# Patient Record
Sex: Male | Born: 1935 | Race: White | Hispanic: No | Smoking: Former smoker
Health system: Southern US, Community
[De-identification: ages and names within clinical notes are randomized; demographics above are authoritative.]

## PROBLEM LIST (undated history)

## (undated) DIAGNOSIS — N183 Chronic kidney disease, stage 3 unspecified: Secondary | ICD-10-CM

## (undated) DIAGNOSIS — N529 Male erectile dysfunction, unspecified: Secondary | ICD-10-CM

## (undated) DIAGNOSIS — R001 Bradycardia, unspecified: Secondary | ICD-10-CM

## (undated) DIAGNOSIS — E119 Type 2 diabetes mellitus without complications: Secondary | ICD-10-CM

## (undated) DIAGNOSIS — D649 Anemia, unspecified: Secondary | ICD-10-CM

## (undated) DIAGNOSIS — I350 Nonrheumatic aortic (valve) stenosis: Secondary | ICD-10-CM

## (undated) DIAGNOSIS — C679 Malignant neoplasm of bladder, unspecified: Secondary | ICD-10-CM

## (undated) DIAGNOSIS — I272 Pulmonary hypertension, unspecified: Secondary | ICD-10-CM

## (undated) DIAGNOSIS — M1990 Unspecified osteoarthritis, unspecified site: Secondary | ICD-10-CM

## (undated) DIAGNOSIS — I10 Essential (primary) hypertension: Secondary | ICD-10-CM

## (undated) DIAGNOSIS — Z9289 Personal history of other medical treatment: Secondary | ICD-10-CM

## (undated) DIAGNOSIS — K5730 Diverticulosis of large intestine without perforation or abscess without bleeding: Secondary | ICD-10-CM

## (undated) DIAGNOSIS — N400 Enlarged prostate without lower urinary tract symptoms: Secondary | ICD-10-CM

## (undated) DIAGNOSIS — R319 Hematuria, unspecified: Secondary | ICD-10-CM

## (undated) DIAGNOSIS — Z85828 Personal history of other malignant neoplasm of skin: Secondary | ICD-10-CM

## (undated) DIAGNOSIS — Z85528 Personal history of other malignant neoplasm of kidney: Secondary | ICD-10-CM

## (undated) DIAGNOSIS — I4819 Other persistent atrial fibrillation: Secondary | ICD-10-CM

## (undated) DIAGNOSIS — I5189 Other ill-defined heart diseases: Secondary | ICD-10-CM

## (undated) DIAGNOSIS — C61 Malignant neoplasm of prostate: Secondary | ICD-10-CM

## (undated) HISTORY — DX: Type 2 diabetes mellitus without complications: E11.9

## (undated) HISTORY — DX: Diverticulosis of large intestine without perforation or abscess without bleeding: K57.30

## (undated) HISTORY — DX: Pulmonary hypertension, unspecified: I27.20

## (undated) HISTORY — DX: Nonrheumatic aortic (valve) stenosis: I35.0

## (undated) HISTORY — DX: Chronic kidney disease, stage 3 (moderate): N18.3

## (undated) HISTORY — DX: Other ill-defined heart diseases: I51.89

## (undated) HISTORY — DX: Other persistent atrial fibrillation: I48.19

## (undated) HISTORY — DX: Male erectile dysfunction, unspecified: N52.9

## (undated) HISTORY — DX: Chronic kidney disease, stage 3 unspecified: N18.30

## (undated) HISTORY — PX: OTHER SURGICAL HISTORY: SHX169

## (undated) HISTORY — DX: Malignant neoplasm of bladder, unspecified: C67.9

## (undated) HISTORY — PX: TONSILLECTOMY: SUR1361

## (undated) HISTORY — DX: Bradycardia, unspecified: R00.1

## (undated) HISTORY — DX: Benign prostatic hyperplasia without lower urinary tract symptoms: N40.0

## (undated) HISTORY — DX: Malignant neoplasm of prostate: C61

---

## 1997-05-22 ENCOUNTER — Ambulatory Visit (HOSPITAL_COMMUNITY): Admission: RE | Admit: 1997-05-22 | Discharge: 1997-05-22 | Payer: Self-pay | Admitting: Gastroenterology

## 1998-03-17 HISTORY — PX: PROSTATE SURGERY: SHX751

## 1998-03-17 HISTORY — PX: URINARY SURGERY: SHX2626

## 1998-04-03 ENCOUNTER — Emergency Department (HOSPITAL_COMMUNITY): Admission: EM | Admit: 1998-04-03 | Discharge: 1998-04-03 | Payer: Self-pay | Admitting: Emergency Medicine

## 1998-04-04 ENCOUNTER — Inpatient Hospital Stay (HOSPITAL_COMMUNITY): Admission: EM | Admit: 1998-04-04 | Discharge: 1998-04-06 | Payer: Self-pay | Admitting: Emergency Medicine

## 1998-04-05 ENCOUNTER — Encounter: Payer: Self-pay | Admitting: Urology

## 1998-04-17 HISTORY — PX: OTHER SURGICAL HISTORY: SHX169

## 1998-05-04 ENCOUNTER — Ambulatory Visit (HOSPITAL_BASED_OUTPATIENT_CLINIC_OR_DEPARTMENT_OTHER): Admission: RE | Admit: 1998-05-04 | Discharge: 1998-05-04 | Payer: Self-pay | Admitting: Urology

## 1999-02-20 ENCOUNTER — Emergency Department (HOSPITAL_COMMUNITY): Admission: EM | Admit: 1999-02-20 | Discharge: 1999-02-20 | Payer: Self-pay | Admitting: Emergency Medicine

## 2002-02-16 HISTORY — PX: MOHS SURGERY: SUR867

## 2002-04-24 ENCOUNTER — Ambulatory Visit (HOSPITAL_COMMUNITY): Admission: RE | Admit: 2002-04-24 | Discharge: 2002-04-24 | Payer: Self-pay | Admitting: Gastroenterology

## 2002-05-17 HISTORY — PX: HERNIA REPAIR: SHX51

## 2002-05-28 ENCOUNTER — Encounter: Payer: Self-pay | Admitting: *Deleted

## 2002-05-28 ENCOUNTER — Encounter (INDEPENDENT_AMBULATORY_CARE_PROVIDER_SITE_OTHER): Payer: Self-pay | Admitting: Specialist

## 2002-05-28 ENCOUNTER — Encounter: Admission: RE | Admit: 2002-05-28 | Discharge: 2002-05-28 | Payer: Self-pay | Admitting: *Deleted

## 2002-05-28 ENCOUNTER — Ambulatory Visit (HOSPITAL_BASED_OUTPATIENT_CLINIC_OR_DEPARTMENT_OTHER): Admission: RE | Admit: 2002-05-28 | Discharge: 2002-05-28 | Payer: Self-pay | Admitting: *Deleted

## 2003-12-17 HISTORY — PX: PROSTATE SURGERY: SHX751

## 2004-01-02 ENCOUNTER — Emergency Department (HOSPITAL_COMMUNITY): Admission: EM | Admit: 2004-01-02 | Discharge: 2004-01-02 | Payer: Self-pay | Admitting: Emergency Medicine

## 2004-02-17 HISTORY — PX: EYE SURGERY: SHX253

## 2004-08-02 ENCOUNTER — Ambulatory Visit (HOSPITAL_COMMUNITY): Admission: RE | Admit: 2004-08-02 | Discharge: 2004-08-02 | Payer: Self-pay | Admitting: Orthopaedic Surgery

## 2005-05-17 ENCOUNTER — Encounter: Payer: Self-pay | Admitting: Urology

## 2005-12-20 ENCOUNTER — Ambulatory Visit: Admission: RE | Admit: 2005-12-20 | Discharge: 2006-03-20 | Payer: Self-pay | Admitting: Radiation Oncology

## 2006-03-27 ENCOUNTER — Ambulatory Visit: Admission: RE | Admit: 2006-03-27 | Discharge: 2006-06-25 | Payer: Self-pay | Admitting: Radiation Oncology

## 2006-04-17 HISTORY — PX: OTHER SURGICAL HISTORY: SHX169

## 2006-06-17 DIAGNOSIS — I4819 Other persistent atrial fibrillation: Secondary | ICD-10-CM

## 2006-06-17 HISTORY — DX: Other persistent atrial fibrillation: I48.19

## 2006-07-10 ENCOUNTER — Inpatient Hospital Stay (HOSPITAL_COMMUNITY): Admission: EM | Admit: 2006-07-10 | Discharge: 2006-07-12 | Payer: Self-pay | Admitting: Emergency Medicine

## 2006-07-11 ENCOUNTER — Encounter (INDEPENDENT_AMBULATORY_CARE_PROVIDER_SITE_OTHER): Payer: Self-pay | Admitting: Internal Medicine

## 2007-02-21 ENCOUNTER — Ambulatory Visit (HOSPITAL_COMMUNITY): Admission: RE | Admit: 2007-02-21 | Discharge: 2007-02-22 | Payer: Self-pay | Admitting: Urology

## 2007-02-21 ENCOUNTER — Encounter (INDEPENDENT_AMBULATORY_CARE_PROVIDER_SITE_OTHER): Payer: Self-pay | Admitting: Urology

## 2007-02-28 ENCOUNTER — Ambulatory Visit: Payer: Self-pay | Admitting: Hematology & Oncology

## 2007-03-02 ENCOUNTER — Emergency Department (HOSPITAL_COMMUNITY): Admission: EM | Admit: 2007-03-02 | Discharge: 2007-03-02 | Payer: Self-pay | Admitting: Emergency Medicine

## 2007-03-04 ENCOUNTER — Observation Stay (HOSPITAL_COMMUNITY): Admission: EM | Admit: 2007-03-04 | Discharge: 2007-03-06 | Payer: Self-pay | Admitting: Urology

## 2007-03-05 ENCOUNTER — Encounter (INDEPENDENT_AMBULATORY_CARE_PROVIDER_SITE_OTHER): Payer: Self-pay | Admitting: Urology

## 2007-03-14 ENCOUNTER — Ambulatory Visit (HOSPITAL_COMMUNITY): Admission: RE | Admit: 2007-03-14 | Discharge: 2007-03-14 | Payer: Self-pay | Admitting: Urology

## 2007-03-17 HISTORY — PX: NEPHRECTOMY: SHX65

## 2007-04-08 ENCOUNTER — Inpatient Hospital Stay (HOSPITAL_COMMUNITY): Admission: RE | Admit: 2007-04-08 | Discharge: 2007-04-12 | Payer: Self-pay | Admitting: Urology

## 2007-04-08 ENCOUNTER — Encounter (INDEPENDENT_AMBULATORY_CARE_PROVIDER_SITE_OTHER): Payer: Self-pay | Admitting: Urology

## 2007-06-17 ENCOUNTER — Encounter (INDEPENDENT_AMBULATORY_CARE_PROVIDER_SITE_OTHER): Payer: Self-pay | Admitting: Urology

## 2007-06-17 ENCOUNTER — Ambulatory Visit (HOSPITAL_COMMUNITY): Admission: RE | Admit: 2007-06-17 | Discharge: 2007-06-18 | Payer: Self-pay | Admitting: Urology

## 2007-07-18 ENCOUNTER — Ambulatory Visit (HOSPITAL_BASED_OUTPATIENT_CLINIC_OR_DEPARTMENT_OTHER): Admission: RE | Admit: 2007-07-18 | Discharge: 2007-07-18 | Payer: Self-pay | Admitting: Urology

## 2007-07-18 ENCOUNTER — Encounter (INDEPENDENT_AMBULATORY_CARE_PROVIDER_SITE_OTHER): Payer: Self-pay | Admitting: Urology

## 2007-10-03 ENCOUNTER — Ambulatory Visit (HOSPITAL_BASED_OUTPATIENT_CLINIC_OR_DEPARTMENT_OTHER): Admission: RE | Admit: 2007-10-03 | Discharge: 2007-10-03 | Payer: Self-pay | Admitting: Urology

## 2007-10-03 ENCOUNTER — Encounter (INDEPENDENT_AMBULATORY_CARE_PROVIDER_SITE_OTHER): Payer: Self-pay | Admitting: Urology

## 2007-10-23 ENCOUNTER — Emergency Department (HOSPITAL_COMMUNITY): Admission: EM | Admit: 2007-10-23 | Discharge: 2007-10-24 | Payer: Self-pay

## 2007-11-09 ENCOUNTER — Emergency Department (HOSPITAL_COMMUNITY): Admission: EM | Admit: 2007-11-09 | Discharge: 2007-11-09 | Payer: Self-pay | Admitting: Emergency Medicine

## 2007-11-28 ENCOUNTER — Ambulatory Visit (HOSPITAL_COMMUNITY): Admission: RE | Admit: 2007-11-28 | Discharge: 2007-11-29 | Payer: Self-pay | Admitting: Urology

## 2007-11-28 ENCOUNTER — Encounter (INDEPENDENT_AMBULATORY_CARE_PROVIDER_SITE_OTHER): Payer: Self-pay | Admitting: Urology

## 2007-12-01 ENCOUNTER — Emergency Department (HOSPITAL_COMMUNITY): Admission: EM | Admit: 2007-12-01 | Discharge: 2007-12-01 | Payer: Self-pay | Admitting: Emergency Medicine

## 2007-12-31 ENCOUNTER — Ambulatory Visit (HOSPITAL_COMMUNITY): Admission: RE | Admit: 2007-12-31 | Discharge: 2008-01-01 | Payer: Self-pay | Admitting: Urology

## 2008-01-10 ENCOUNTER — Inpatient Hospital Stay (HOSPITAL_COMMUNITY): Admission: EM | Admit: 2008-01-10 | Discharge: 2008-01-12 | Payer: Self-pay | Admitting: Podiatry

## 2008-01-22 ENCOUNTER — Inpatient Hospital Stay (HOSPITAL_COMMUNITY): Admission: AD | Admit: 2008-01-22 | Discharge: 2008-02-05 | Payer: Self-pay | Admitting: Urology

## 2008-01-24 ENCOUNTER — Encounter (INDEPENDENT_AMBULATORY_CARE_PROVIDER_SITE_OTHER): Payer: Self-pay | Admitting: Urology

## 2008-02-13 ENCOUNTER — Encounter (HOSPITAL_BASED_OUTPATIENT_CLINIC_OR_DEPARTMENT_OTHER): Admission: RE | Admit: 2008-02-13 | Discharge: 2008-04-04 | Payer: Self-pay | Admitting: Internal Medicine

## 2008-02-25 ENCOUNTER — Ambulatory Visit (HOSPITAL_COMMUNITY): Admission: RE | Admit: 2008-02-25 | Discharge: 2008-02-25 | Payer: Self-pay | Admitting: Internal Medicine

## 2008-04-06 ENCOUNTER — Encounter (HOSPITAL_BASED_OUTPATIENT_CLINIC_OR_DEPARTMENT_OTHER): Admission: RE | Admit: 2008-04-06 | Discharge: 2008-06-19 | Payer: Self-pay | Admitting: General Surgery

## 2008-07-27 ENCOUNTER — Other Ambulatory Visit: Payer: Self-pay | Admitting: Urology

## 2008-07-30 ENCOUNTER — Encounter (INDEPENDENT_AMBULATORY_CARE_PROVIDER_SITE_OTHER): Payer: Self-pay | Admitting: Urology

## 2008-07-30 ENCOUNTER — Ambulatory Visit (HOSPITAL_COMMUNITY): Admission: AD | Admit: 2008-07-30 | Discharge: 2008-07-31 | Payer: Self-pay | Admitting: Urology

## 2008-11-30 ENCOUNTER — Inpatient Hospital Stay (HOSPITAL_COMMUNITY): Admission: AD | Admit: 2008-11-30 | Discharge: 2008-12-04 | Payer: Self-pay | Admitting: Urology

## 2009-03-12 ENCOUNTER — Emergency Department (HOSPITAL_COMMUNITY): Admission: EM | Admit: 2009-03-12 | Discharge: 2009-03-12 | Payer: Self-pay | Admitting: Emergency Medicine

## 2009-06-16 ENCOUNTER — Emergency Department (HOSPITAL_COMMUNITY): Admission: EM | Admit: 2009-06-16 | Discharge: 2009-06-16 | Payer: Self-pay | Admitting: Emergency Medicine

## 2009-06-18 ENCOUNTER — Emergency Department (HOSPITAL_COMMUNITY): Admission: EM | Admit: 2009-06-18 | Discharge: 2009-06-18 | Payer: Self-pay | Admitting: Emergency Medicine

## 2009-07-05 ENCOUNTER — Emergency Department (HOSPITAL_COMMUNITY): Admission: EM | Admit: 2009-07-05 | Discharge: 2009-07-05 | Payer: Self-pay | Admitting: Emergency Medicine

## 2009-07-12 ENCOUNTER — Ambulatory Visit (HOSPITAL_COMMUNITY): Admission: RE | Admit: 2009-07-12 | Discharge: 2009-07-12 | Payer: Self-pay | Admitting: Urology

## 2009-09-28 ENCOUNTER — Emergency Department (HOSPITAL_COMMUNITY): Admission: EM | Admit: 2009-09-28 | Discharge: 2009-09-29 | Payer: Self-pay | Admitting: Emergency Medicine

## 2009-09-29 ENCOUNTER — Observation Stay (HOSPITAL_COMMUNITY): Admission: AD | Admit: 2009-09-29 | Discharge: 2009-09-30 | Payer: Self-pay | Admitting: Urology

## 2010-03-31 LAB — CBC
HCT: 34.5 % — ABNORMAL LOW (ref 39.0–52.0)
Hemoglobin: 11.2 g/dL — ABNORMAL LOW (ref 13.0–17.0)
MCHC: 32.3 g/dL (ref 30.0–36.0)
WBC: 12.4 10*3/uL — ABNORMAL HIGH (ref 4.0–10.5)

## 2010-03-31 LAB — BASIC METABOLIC PANEL
GFR calc non Af Amer: 35 mL/min — ABNORMAL LOW (ref >60)
Glucose, Bld: 89 mg/dL (ref 70–99)
Potassium: 4.6 mEq/L (ref 3.5–5.1)
Sodium: 139 mEq/L (ref 135–145)

## 2010-04-04 LAB — URINALYSIS, ROUTINE W REFLEX MICROSCOPIC
Glucose, UA: NEGATIVE mg/dL
Ketones, ur: 15 mg/dL — AB
Ketones, ur: NEGATIVE mg/dL
Nitrite: POSITIVE — AB
Protein, ur: 100 mg/dL — AB
Specific Gravity, Urine: 1.015 (ref 1.005–1.030)
Urobilinogen, UA: 0.2 mg/dL (ref 0.0–1.0)
Urobilinogen, UA: 1 mg/dL (ref 0.0–1.0)
pH: 7 (ref 5.0–8.0)

## 2010-04-04 LAB — URINE MICROSCOPIC-ADD ON

## 2010-04-04 LAB — URINE CULTURE
Colony Count: 50000
Culture: NO GROWTH

## 2010-04-08 LAB — URINALYSIS, ROUTINE W REFLEX MICROSCOPIC
Bilirubin Urine: NEGATIVE
Glucose, UA: NEGATIVE mg/dL
Nitrite: POSITIVE — AB
Protein, ur: >300 mg/dL — AB
Specific Gravity, Urine: 1.021 (ref 1.005–1.030)
Urobilinogen, UA: 0.2 mg/dL (ref 0.0–1.0)
pH: 7 (ref 5.0–8.0)

## 2010-04-08 LAB — URINE MICROSCOPIC-ADD ON

## 2010-04-20 LAB — TYPE AND SCREEN
ABO/RH(D): O POS
Antibody Screen: NEGATIVE

## 2010-04-20 LAB — BASIC METABOLIC PANEL
BUN: 17 mg/dL (ref 6–23)
CO2: 25 mEq/L (ref 19–32)
Calcium: 8 mg/dL — ABNORMAL LOW (ref 8.4–10.5)
Chloride: 107 mEq/L (ref 96–112)
Creatinine, Ser: 1.74 mg/dL — ABNORMAL HIGH (ref 0.4–1.5)
GFR calc Af Amer: 45 mL/min — ABNORMAL LOW (ref >60)
Glucose, Bld: 135 mg/dL — ABNORMAL HIGH (ref 70–99)

## 2010-04-20 LAB — COMPREHENSIVE METABOLIC PANEL
ALT: 13 U/L (ref 0–53)
AST: 16 U/L (ref 0–37)
Albumin: 3 g/dL — ABNORMAL LOW (ref 3.5–5.2)
Calcium: 8.4 mg/dL (ref 8.4–10.5)
Chloride: 109 mEq/L (ref 96–112)
Creatinine, Ser: 1.99 mg/dL — ABNORMAL HIGH (ref 0.4–1.5)
GFR calc Af Amer: 39 mL/min — ABNORMAL LOW (ref >60)
Sodium: 138 mEq/L (ref 135–145)
Total Bilirubin: 0.7 mg/dL (ref 0.3–1.2)

## 2010-04-20 LAB — CBC
MCV: 92.9 fL (ref 78.0–100.0)
Platelets: 162 10*3/uL (ref 150–400)
RBC: 3.35 MIL/uL — ABNORMAL LOW (ref 4.22–5.81)
WBC: 8.4 10*3/uL (ref 4.0–10.5)

## 2010-04-20 LAB — HEMOGLOBIN AND HEMATOCRIT, BLOOD
HCT: 26.6 % — ABNORMAL LOW (ref 39.0–52.0)
Hemoglobin: 6.3 g/dL — CL (ref 13.0–17.0)
Hemoglobin: 8.8 g/dL — ABNORMAL LOW (ref 13.0–17.0)
Hemoglobin: 8.9 g/dL — ABNORMAL LOW (ref 13.0–17.0)

## 2010-04-20 LAB — PREPARE RBC (CROSSMATCH)

## 2010-04-25 LAB — BASIC METABOLIC PANEL
BUN: 13 mg/dL (ref 6–23)
Chloride: 104 mEq/L (ref 96–112)
Creatinine, Ser: 1.73 mg/dL — ABNORMAL HIGH (ref 0.4–1.5)
Glucose, Bld: 107 mg/dL — ABNORMAL HIGH (ref 70–99)
Potassium: 4.5 mEq/L (ref 3.5–5.1)

## 2010-04-25 LAB — CBC
HCT: 39.4 % (ref 39.0–52.0)
MCV: 93.5 fL (ref 78.0–100.0)
Platelets: 196 10*3/uL (ref 150–400)
RDW: 16.4 % — ABNORMAL HIGH (ref 11.5–15.5)

## 2010-05-02 LAB — CBC
HCT: 24.5 % — ABNORMAL LOW (ref 39.0–52.0)
HCT: 25.1 % — ABNORMAL LOW (ref 39.0–52.0)
HCT: 26.4 % — ABNORMAL LOW (ref 39.0–52.0)
HCT: 27.9 % — ABNORMAL LOW (ref 39.0–52.0)
Hemoglobin: 8.3 g/dL — ABNORMAL LOW (ref 13.0–17.0)
Hemoglobin: 8.8 g/dL — ABNORMAL LOW (ref 13.0–17.0)
Hemoglobin: 9.3 g/dL — ABNORMAL LOW (ref 13.0–17.0)
MCHC: 32.9 g/dL (ref 30.0–36.0)
MCHC: 33.2 g/dL (ref 30.0–36.0)
MCHC: 33.3 g/dL (ref 30.0–36.0)
MCV: 87.4 fL (ref 78.0–100.0)
MCV: 87.8 fL (ref 78.0–100.0)
Platelets: 233 10*3/uL (ref 150–400)
RBC: 2.87 MIL/uL — ABNORMAL LOW (ref 4.22–5.81)
RBC: 3.02 MIL/uL — ABNORMAL LOW (ref 4.22–5.81)
RBC: 3.18 MIL/uL — ABNORMAL LOW (ref 4.22–5.81)
RDW: 14.8 % (ref 11.5–15.5)
RDW: 15.1 % (ref 11.5–15.5)
RDW: 15.3 % (ref 11.5–15.5)
WBC: 6.5 10*3/uL (ref 4.0–10.5)
WBC: 6.7 10*3/uL (ref 4.0–10.5)
WBC: 9.9 10*3/uL (ref 4.0–10.5)

## 2010-05-02 LAB — DIFFERENTIAL
Basophils Absolute: 0.1 10*3/uL (ref 0.0–0.1)
Basophils Absolute: 0.1 10*3/uL (ref 0.0–0.1)
Basophils Absolute: 0.1 10*3/uL (ref 0.0–0.1)
Basophils Relative: 1 % (ref 0–1)
Basophils Relative: 1 % (ref 0–1)
Basophils Relative: 1 % (ref 0–1)
Eosinophils Absolute: 0.3 10*3/uL (ref 0.0–0.7)
Eosinophils Relative: 3 % (ref 0–5)
Eosinophils Relative: 5 % (ref 0–5)
Lymphocytes Relative: 9 % — ABNORMAL LOW (ref 12–46)
Monocytes Absolute: 1.3 10*3/uL — ABNORMAL HIGH (ref 0.1–1.0)
Monocytes Absolute: 1.6 10*3/uL — ABNORMAL HIGH (ref 0.1–1.0)
Monocytes Relative: 11 % (ref 3–12)
Monocytes Relative: 15 % — ABNORMAL HIGH (ref 3–12)
Neutro Abs: 4.4 10*3/uL (ref 1.7–7.7)
Neutrophils Relative %: 67 % (ref 43–77)

## 2010-05-02 LAB — CROSSMATCH

## 2010-05-02 LAB — BASIC METABOLIC PANEL
BUN: 19 mg/dL (ref 6–23)
BUN: 8 mg/dL (ref 6–23)
CO2: 24 mEq/L (ref 19–32)
CO2: 25 mEq/L (ref 19–32)
Calcium: 7.7 mg/dL — ABNORMAL LOW (ref 8.4–10.5)
Calcium: 7.9 mg/dL — ABNORMAL LOW (ref 8.4–10.5)
Calcium: 8.2 mg/dL — ABNORMAL LOW (ref 8.4–10.5)
Calcium: 8.2 mg/dL — ABNORMAL LOW (ref 8.4–10.5)
Chloride: 102 mEq/L (ref 96–112)
Chloride: 108 mEq/L (ref 96–112)
Chloride: 108 mEq/L (ref 96–112)
Creatinine, Ser: 1.48 mg/dL (ref 0.4–1.5)
Creatinine, Ser: 1.6 mg/dL — ABNORMAL HIGH (ref 0.4–1.5)
GFR calc Af Amer: 45 mL/min — ABNORMAL LOW (ref >60)
GFR calc Af Amer: 49 mL/min — ABNORMAL LOW (ref >60)
GFR calc Af Amer: 55 mL/min — ABNORMAL LOW (ref >60)
GFR calc non Af Amer: 40 mL/min — ABNORMAL LOW (ref >60)
GFR calc non Af Amer: 45 mL/min — ABNORMAL LOW (ref >60)
Glucose, Bld: 107 mg/dL — ABNORMAL HIGH (ref 70–99)
Glucose, Bld: 119 mg/dL — ABNORMAL HIGH (ref 70–99)
Potassium: 3.8 mEq/L (ref 3.5–5.1)
Sodium: 135 mEq/L (ref 135–145)
Sodium: 137 mEq/L (ref 135–145)
Sodium: 137 mEq/L (ref 135–145)
Sodium: 139 mEq/L (ref 135–145)

## 2010-05-02 LAB — COMPREHENSIVE METABOLIC PANEL
Alkaline Phosphatase: 44 U/L (ref 39–117)
BUN: 12 mg/dL (ref 6–23)
Chloride: 110 mEq/L (ref 96–112)
GFR calc non Af Amer: 39 mL/min — ABNORMAL LOW (ref >60)
Glucose, Bld: 114 mg/dL — ABNORMAL HIGH (ref 70–99)
Potassium: 4.1 mEq/L (ref 3.5–5.1)
Total Bilirubin: 0.7 mg/dL (ref 0.3–1.2)

## 2010-05-02 LAB — URINALYSIS, ROUTINE W REFLEX MICROSCOPIC
Glucose, UA: NEGATIVE mg/dL
Nitrite: NEGATIVE
Specific Gravity, Urine: 1.016 (ref 1.005–1.030)
pH: 6 (ref 5.0–8.0)

## 2010-05-02 LAB — CULTURE, BLOOD (ROUTINE X 2)

## 2010-05-02 LAB — URINE CULTURE

## 2010-05-02 LAB — HEMOGLOBIN AND HEMATOCRIT, BLOOD
HCT: 22 % — ABNORMAL LOW (ref 39.0–52.0)
HCT: 22.7 % — ABNORMAL LOW (ref 39.0–52.0)
HCT: 28.6 % — ABNORMAL LOW (ref 39.0–52.0)
HCT: 28.9 % — ABNORMAL LOW (ref 39.0–52.0)
Hemoglobin: 7.3 g/dL — CL (ref 13.0–17.0)
Hemoglobin: 8.8 g/dL — ABNORMAL LOW (ref 13.0–17.0)
Hemoglobin: 9.4 g/dL — ABNORMAL LOW (ref 13.0–17.0)

## 2010-05-02 LAB — PROTIME-INR
INR: 0.9 (ref 0.00–1.49)
INR: 1 (ref 0.00–1.49)
Prothrombin Time: 12.7 seconds (ref 11.6–15.2)
Prothrombin Time: 13.4 seconds (ref 11.6–15.2)

## 2010-05-02 LAB — APTT
aPTT: 27 seconds (ref 24–37)
aPTT: 31 seconds (ref 24–37)

## 2010-05-02 LAB — URINE MICROSCOPIC-ADD ON

## 2010-05-02 LAB — PREPARE RBC (CROSSMATCH)

## 2010-05-02 LAB — TYPE AND SCREEN: ABO/RH(D): O POS

## 2010-05-31 NOTE — Op Note (Signed)
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   PROCEDURE:  1. Cystoscopy.  2. Examination under anesthesia.  3. Transurethral resection of bladder tumor (3 cm).   SURGEON:  Heloise Purpura, MD   ANESTHESIA:  General.   COMPLICATIONS:  None.   INDICATION:  Mr. [name] is an 77 year old gentleman with a history of  urothelial carcinoma of the left renal pelvis status post a  nephroureterectomy.  He subsequently was found to have a recurrence in  his bladder and underwent transurethral resection in June 2009.  He  presented recently for surveillance cystoscopy and was noted to have two  small recurrent papillary tumors that did appear low-grade on  cystoscopic exam.  After discussing these findings, he elected to  proceed with the above procedures.  Potential risks, complications and  alternative options were discussed in detail.  Informed consent was  obtained.   DESCRIPTION OF PROCEDURE:  The patient was taken to the operating room  and a general anesthetic was administered.  He was given preoperative  antibiotics, placed in the dorsal lithotomy position and prepped and  draped in the usual sterile fashion.  Next, a preoperative time-out was  performed.  Pelvic examination under anesthesia revealed no pelvic  masses. Cystourethroscopy was then performed and the bladder was closely  examined.  The patient's healing tissue from his prior TURP was noted.  He had moderate  trabeculation throughout the bladder with a few small  diverticula.  There was noted to be a papillary tumor off the left  lateral aspect of the bladder with an additional small bladder tumor  just above that.  In addition, there was a small papillary tumor toward  the posterior aspect of the bladder.  The largest of these bladder  tumors measured approximately 3 cm.  The 24 French resectoscope sheath  was then advanced into the bladder and with loop resection, the bladder  tumors were removed and sent for pathologic analysis.  Additional cold  cup biopsy sections of the base of the main tumor were also taken.  These areas were then fulgurated and the bladder was emptied and  reinspected.  Hemostasis appeared excellent.  The  resectoscope was then removed and a 20 [place] Foley catheter was  inserted into the bladder and 40 mL of mitomycin in 20 mL of sterile  water was instilled into the patient's bladder and the catheter was  plugged.  The patient was able to be awakened and transferred to the  recovery unit in satisfactory condition.      Heloise Purpura, MD  Electronically Signed     LB/MEDQ  D:  10/03/2007  T:  10/06/2007  Job:  [id number]

## 2010-05-31 NOTE — Discharge Summary (Signed)
NAMESELSO, Alexander Barnett NO.:  [id number]   MEDICAL RECORD NO.:  [id number]          PATIENT TYPE:  OBV   LOCATION:  1615                         FACILITY:  [facility]   PHYSICIAN:  Heloise Purpura, MD      DATE OF BIRTH:  1920/08/07   DATE OF ADMISSION:  03/04/2007  DATE OF DISCHARGE:  03/06/2007                               DISCHARGE SUMMARY   ADMISSION DIAGNOSES:  1. Hematuria.  2. Left renal pelvic tumor.   DISCHARGE DIAGNOSES:  1. Hematuria.  2. Left renal pelvic tumor.   HISTORY AND PHYSICAL:  For full details, please see admission history  and physical.  Briefly, Mr. Clementson is an 77 year old gentleman who  recently presented with gross hematuria and was found to have a left  renal pelvic tumor.  He underwent diagnostic ureteroscopy and following  this procedure, had persistent worsening gross hematuria, including  intermittent clot urinary retention.  This was initially managed with  catheter drainage and irrigation, but continued to persist.  He was  therefore admitted to the hospital and placed on continuous bladder  irrigation.  He was monitored on the evening of March 04, 2007, and  the following morning, continued to have persistent hematuria and clots  within his bladder.  He was therefore taken to the operating room for  cystoscopy, clot evacuation and identification of the potential bleeding  source.  This was noted to be in the area of the bladder neck and  prostate and likely a result of his benign prostatic hyperplasia and  previous radiation therapy for prostate cancer.  He did undergo a small  transurethral resection of the prostate to help aid in hemostasis.  He  was maintained on continuous bladder irrigation that day and his urine  remained clear.  The following day, he underwent a voiding trial which  he passed successfully.  He was able to be discharged home in excellent  condition.   DISPOSITION:  Home.   DISCHARGE MEDICATIONS:  He was  instructed to resume his regular home  medications except to hold his Voltaren and his Coumadin.   DISCHARGE INSTRUCTIONS:  He was instructed to call the office should he  develop recurrent hematuria or difficulty voiding.   FOLLOW UP:  He will plan to follow-up as already scheduled in early  March for further evaluation and preparation for possible left  nephroureterectomy.      Heloise Purpura, MD  Electronically Signed    LB/MEDQ  D:  03/07/2007  T:  03/08/2007  Job:  [id number]

## 2010-05-31 NOTE — Op Note (Signed)
NAMETASHAWN, LASWELL NO.:  [id number]   MEDICAL RECORD NO.:  [id number]          PATIENT TYPE:  INP   LOCATION:  0002                         FACILITY:  [facility]   PHYSICIAN:  Heloise Purpura, MD      DATE OF BIRTH:  [birth year]   DATE OF PROCEDURE:  04/08/2007  DATE OF DISCHARGE:                               OPERATIVE REPORT   PREOPERATIVE DIAGNOSIS:  Left renal pelvic mass.   POSTOPERATIVE DIAGNOSIS:  Left renal pelvic mass.   PROCEDURE:  Laparoscopic nephrectomy with open distal ureterectomy and  excision of bladder cuff.   SURGEON:  Dr. Heloise Purpura.   ASSISTANT:  Dr. Delman Kitten.   ANESTHESIA:  General.   COMPLICATIONS:  None.   ESTIMATED BLOOD LOSS:  200 mL.   INTRAVENOUS FLUIDS:  2500 mL of lactated Ringer's.   SPECIMENS:  Left kidney, ureter, and bladder cuff.   DISPOSITION OF PATHOLOGY SPECIMEN:  To the pathology lab.   DRAINS:  1. 28-French three-way Foley catheter.  2. #19 Blake pelvic drain.   INDICATIONS:  Mr. Lindfors is an 77 year old gentleman who presented with  gross hematuria.  He does have a history of prostate cancer and has  undergone prior treatment with external beam radiation therapy and  androgen deprivation.  He underwent a complete hematuria evaluation  which demonstrated a large left renal pelvic papillary tumor consistent  with a urothelial tumor.  He was also noted to have a suspicious urine  cytology from the left renal pelvis.  He underwent metastatic evaluation  which was negative and after a thorough discussion, the patient elects  to proceed with the above procedures.  The potential risks,  complications, and alternative treatment options were discussed in  detail and informed consent was obtained.   DESCRIPTION OF PROCEDURE:  The patient was taken to the operating room  and general anesthetic was administered.  He was given preoperative  antibiotics, placed in the left modified flank position, and prepped and  draped in the usual sterile fashion.  Next preoperative time-out was  performed.  A site was selected just off the midline toward the left  side approximately at the level of the umbilicus for placement of the  camera port.  This was placed using a standard open Hassan technique  which allowed entry into the peritoneal cavity under direct vision  without difficulty.  Zero Vicryl holding stitches were then placed in  the anterior rectus sheath and a 10-mm Hassan cannula was inserted.  The  30 degrees lens was then used to inspect the abdomen after a  pneumoperitoneum was established.  There was no evidence for any intra-  abdominal injuries or other abnormalities.  The remaining ports were  then placed.  A 12 mm port was placed in the left lower quadrant and a 5  mm port was placed in the left upper quadrant.  An additional 5 mm port  was placed in the far left lateral abdominal wall.  The white line of  Toldt was incised along the length of the descending colon with a  harmonic scalpel allowing the colon to  be reflected medially and the  space between the colonic mesentery the anterior layer of Gerota's  fascia to be developed.  The ureter and gonadal vein were identified at  the retroperitoneum was exposed and these structures were then lifted  anteriorly off the psoas muscle.  Dissection then proceeded superiorly  up toward the renal hilum where the main renal vein and renal artery  were identified.  Using a combination of ultrasonic energy and blunt  dissection, the single renal artery and renal vein were identified and  were isolated.  The renal artery was then divided between multiple Hem-o-  lok clips.  The renal vein was then examined and did appear to be flat.  It was then divided between multiple extra large Hem-o-lok clips.  Dissection then proceeded superiorly.  Gerota's fascia was intentionally  entered, allowing the adrenal gland to be spared.  The superior pole of  the renal  parenchyma was identified and the harmonic scalpel was used to  separate the upper pole attachments from the spleen and adrenal gland.  The dissection then proceeded laterally where the kidney was separated  from the abdominal wall, allowing it to be freely mobilized on the  ureter and gonadal vein.  The gonadal vein was then dissected down  toward the inguinal ring where it was divided between multiple Hem-o-lok  clips.  The ureter was then isolated over the iliac vessels down toward  the pelvis.  At this point the patient was placed in the Trendelenburg  position and an additional 12 mm port was placed in the lower midline.  The bladder was filled with approximately 200 mL of saline allowing the  contour of the bladder to be identified.  The medial umbilical ligament  was identified and was divided allowing the bladder to be reflected  posteriorly and lateral in the space of Retzius to be entered and  developed.  The ureter was identified down toward its insertion into the  detrusor muscle with the small periureteral vessels identified and  clipped as necessary.  At this point the original camera port site and  left lower quadrant 12-mm port were closed with 0 Vicryl sutures placed  in the aid of the suture passer device.  All remaining ports were then  removed under direct vision.  The 12 mm lower midline port site was then  extended inferiorly into a lower midline incision and carried down along  the length of this incision through the rectus fascia.  This again  allowed the space of Retzius to be further developed the bladder  reflected medially.  The kidney was brought out through this wound and  the ureter was identified and the remaining attachments divided as  necessary.  A pursestring 3-0 Vicryl suture was then placed around the  insertion of the ureter and the ureter was freed up with the  electrocautery and the bladder was emptied with a wide bladder cuff.  Pursestring 3-0  Vicryl sutures were then used to perform closure of the  bladder with a subsequent 2-0 running Vicryl suture to reinforce this  closure.  The bladder was then reinflated and there did not appear to be  evidence for a leak from the bladder.  A #19 Blake drain was placed in  the perivesical space and brought out through a separate stab incision  in the left lower quadrant.  Preparations were then made for closure  after hemostasis was ensured.  The rectus fascia was closed with a  running #1 PDS  suture and the lower midline incision was copiously  irrigated.  All incision sites were then reapproximated at the skin  level with staples.  The patient appeared to tolerate the procedure well  and without complications.  Sterile dressings were applied and the  procedure was ended.      Heloise Purpura, MD  Electronically Signed     LB/MEDQ  D:  04/08/2007  T:  04/08/2007  Job:  [id number]

## 2010-05-31 NOTE — Op Note (Signed)
NAME:  GURLEY, CLIMER NO.:  [id number]   MEDICAL RECORD NO.:  [id number]          PATIENT TYPE:  AMB   LOCATION:  DAY                          FACILITY:  [facility]   PHYSICIAN:  Heloise Purpura, MD      DATE OF BIRTH:  1920/04/15   DATE OF PROCEDURE:  02/21/2007  DATE OF DISCHARGE:                               OPERATIVE REPORT   PREOPERATIVE DIAGNOSIS:  1. Hematuria.  2. Left renal pelvic mass.   POSTOPERATIVE DIAGNOSIS:  1. Hematuria.  2. Left renal pelvic tumor.   PROCEDURE:  1. Cystoscopy.  2. Bladder biopsy.  3. Left renal pelvis washing.  4. Left ureteroscopy (diagnostic).  5. Left ureteral stent placement (6 x 26).   SURGEON:  Heloise Purpura, M.D.   ANESTHESIA:  General.   COMPLICATIONS:  None.   PATHOLOGY:  1. Left bladder biopsy.  2. Left renal pelvis washing.   Disposition of specimens to pathology.   FINDINGS:  On retrograde pyelography, the patient was noted to have a  large filling defect of the upper pole calix.  He was also noted to have  J-hooking of his distal ureter.  On direct inspection with ureteroscopy,  he was noted to have a papillary tumor of the upper pole of the kidney.   INDICATIONS FOR PROCEDURE:  The patient is an 77 year old gentleman with  a history of prostate cancer status post radiation therapy.  He recently  developed gross hematuria and underwent a full evaluation which did  demonstrate findings on the CT scan suggestive of an upper pole renal  pelvic mass.  He also was found on office cystoscopy to have  erythematous area that were potentially concerning.  He also had a  suspicious cytology from his bladder washing.  After discussion  regarding these findings and options for management, he elected to  proceed with the above procedures.  Potential risks, complications, and  alternative options were discussed in detail with the patient and  informed consent was obtained.   DESCRIPTION OF PROCEDURE:  The patient  was taken to the operating room  and a general anesthesia was administered.  He was given preoperative  antibiotics, placed in the dorsal lithotomy position, and prepped and  draped in the usual sterile fashion.   A preoperative timeout was performed.  Cystourethroscopy was performed  which demonstrated a significantly enlarged prostate with trilobar  hypertrophy.  There were noted to be changes consistent with radiation  therapy including blanching of the mucosa.  On inspection of the  bladder, he was noted to have neovascularity along the trigone and  bladder neck.  There was noted to be erythematous areas along the left  bladder wall which were previously seen on office cystoscopy.  Otherwise, his bladder was free of any obvious tumors, stones, etc.  He  was noted to have moderate trabeculation of the bladder with multiple  small diverticula.  The left bladder wall was identified and the cold  cut biopsy forceps were used to biopsy this area.  This was then  fulgurated with the Bugby electrode to achieve hemostasis.  Attention  was  then turned to the left ureteral orifice which was cannulated with a  6 French ureteral catheter.  An attempt to place a 0.038 Sensor guide  wire was initially unsuccessful due to J-hooking of the ureter.  Therefore this wire was removed and a 0.038 glide wire was able to be  passed up the ureter and into the renal pelvis under fluoroscopic  guidance.  The 6 French ureteral catheter was advanced over the wire and  a renal pelvic washing was obtained.  Contrast was injected and a  retrograde pyelogram was performed.  No obvious ureteral filling defects  had been identified.  There was noted to be a filling defect in the  upper pole of the calix consistent with the patient's mass on CT scan.  A 0.038 Sensor guide wire was then advanced into the renal pelvis and  the ureteral catheter was removed.  A second 0.038 glide wire was then  advanced up into the renal  pelvis to be used as a working wire.  The  Storz flexible ureteroscope was advanced over the wire into the renal  pelvis and diagnostic ureteroscopy was performed.  The lower middle  calices demonstrated no abnormalities or tumors.  The upper pole did  demonstrate a large papillary tumor consistent with a urothelial  carcinoma.  Due to the fact that the patient did have a clear obvious  tumor and a suspicious cytology on previous evaluation as well as the  fact that he is on chronic anticoagulation, it was decided not to biopsy  this lesion at this time as his diagnosis was fairly clear.  The  ureteroscope was then retracted down the ureter and no other  abnormalities were noted along the length of the left ureter.  The  cystoscope was then placed over the guide wire that remained in place  and a 6 x 26 double J ureteral stent was placed under fluoroscopic and  cystoscope guidance.  On removal of the cystoscope, the stent was  malpositioned.  A wire was then readvanced up the stent and  appropriately positioned and the cystoscope was then able to be removed  successfully and the bladder drained.  The patient appeared to tolerate  the procedure well without complications.  He was able to be awakened  and transferred to the recovery room in satisfactory condition.      Heloise Purpura, MD  Electronically Signed     LB/MEDQ  D:  02/21/2007  T:  02/22/2007  Job:  [id number]

## 2010-05-31 NOTE — Consult Note (Signed)
NAMEANSLEY, STANWOOD NO.:  [id number]   MEDICAL RECORD NO.:  [id number]          PATIENT TYPE:  INP   LOCATION:  1615                         FACILITY:  [facility]   PHYSICIAN:  Heloise Purpura, MD      DATE OF BIRTH:  01-Jun-1920   DATE OF CONSULTATION:  03/02/2007  DATE OF DISCHARGE:                                 CONSULTATION   REASON FOR CONSULTATION:  1. Hematuria.  2. Clot retention.   REQUESTING PHYSICIAN:  Dr. Carleene Cooper   HISTORY:  Mr. Cabreja is an 77 year old gentleman who is well-known to me  and has a history of prostate cancer, benign prostatic hyperplasia, and  a left renal pelvic tumor.  He has had gross hematuria since his  ureteroscopic procedure approximately one week ago.  He was seen in the  office on Friday and underwent placement of an Ainsworth catheter and  irrigation.  He was discharged home and presents today with further  difficulties including  intermittent obstruction of his catheter and  continued hematuria.  He denies any fever, abdominal pain, nausea or  vomiting.   PHYSICAL EXAMINATION:  ABDOMEN:  Soft and nontender.  GU:  The patient's urethral catheter is draining grossly bloody urine.   PROCEDURE:  A 0.038 glide wire was inserted through the Ainsworth  catheter into the bladder and the Ainsworth catheter was removed.  A 24-  French Silastic hematuria catheter was able to be advanced over the wire  and into the bladder and subsequently irrigated.  Multiple clots were  removed from the bladder with this catheter after attempts at irrigation  with Ainsworth catheter were unsuccessful.  This was placed to a leg bag  and the urine appeared to be draining well.  Mr. Vanderlinde was subsequently  able to be discharged home after a period of observation.   IMPRESSION:  Hematuria and clot urinary retention.   PLAN:  Mr. Cowden will follow up on Monday as scheduled for further  evaluation.      Heloise Purpura, MD  Electronically  Signed     LB/MEDQ  D:  03/02/2007  T:  03/04/2007  Job:  [id number]   cc:   Osvaldo Human, M.D.  Fax: [phone]

## 2010-05-31 NOTE — Op Note (Signed)
NAMEWILLIM, TURNAGE NO.:  [id number]   MEDICAL RECORD NO.:  [id number]          PATIENT TYPE:  OIB   LOCATION:  1426                         FACILITY:  [facility]   PHYSICIAN:  Heloise Purpura, MD      DATE OF BIRTH:  1920-07-26   DATE OF PROCEDURE:  11/28/2007  DATE OF DISCHARGE:                               OPERATIVE REPORT   PREOPERATIVE DIAGNOSES:  1. Gross hematuria  2. History of bladder cancer.  3. History of prostate cancer.  4. Benign prostatic hypertrophy and urinary retention.   POSTOPERATIVE DIAGNOSES:  1. Gross hematuria  2. History of bladder cancer.  3. History of prostate cancer.  4. Benign prostatic hypertrophy and urinary retention.   PROCEDURES:  1. Cystoscopy.  2. Transurethral resection of the prostate.   SURGEON:  Dr. Heloise Purpura.   ASSISTANT:  Dr. Aldean Baker.   ANESTHESIA:  General.   COMPLICATIONS:  None.   SPECIMENS:  Prostate chips.   DISPOSITION OF SPECIMEN:  To pathology.   DRAINS:  22-French three-way Foley catheter.   INDICATIONS:  Mr. Shavers is an 77 year old gentleman with a history of a  urothelial carcinoma of the left renal pelvis and bladder.  He also has  a history of prostate cancer status post radiation therapy.  He did  develop urinary retention last spring and subsequently underwent a  transurethral resection of the prostate this summer and was able to void  postoperatively.  Recently, he has developed recurrence of gross  hematuria and difficulty voiding as well as intermittent urinary  retention.  We therefore discussed options and he did agree to proceed  with cystoscopic evaluation and possible transurethral resection of the  prostate or bladder tumors as present.  He has been treated with culture  specific antibiotics for 1 week.  The potential risks, complications,  and alternative options were discussed in detail with the patient and  informed consent was obtained.   DESCRIPTION OF PROCEDURE:   The patient was taken to the operating room  and a general anesthetic was administered.  He was given preoperative  antibiotics, placed in the dorsal lithotomy position, prepped and draped  in the usual sterile fashion.  Next, a preoperative time-out was  performed.  Cystourethroscopy was then performed which demonstrated a  normal anterior urethra.  The prostatic urethra was noted to have a  markedly abnormal appearance with a white friable tissue that was  sloughing from the prostatic urethra consistent with his history of  radiation therapy and prior TURP.  He was noted to have some clot within  the prostatic urethra, indicating the prostate was the likely source of  hematuria.  The bladder was then systematically examined with the right  ureteral orifice noted to be in its normal anatomic position.  The left  ureteral orifice was absent consistent with his history of a left  nephroureterectomy.  The bladder was noted to be severely trabeculated  with multiple cellules and small diverticula.  On inspection of the  bladder, there was no evidence for any obvious bladder tumors.  There  was noted to be some  mild edema consistent with his recent Foley  catheter.  Attention then turned to the prostate.  It was decided to  proceed with transurethral resection of the prostate to help clear the  tissue that was sloughed as well as to evacuate any clots within the  prostatic urethra.  It was felt that this tissue was likely responsible  for his voiding symptoms and most likely his hematuria as well.  Therefore the cystoscope was removed and replaced with the 26-French  resectoscope sheath.  Using loop electrocautery resection, the friable  prostate tissue was then removed circumferentially between the bladder  neck and verumontanum.  Special care was taken not to proceed distally  where the external sphincter could be potentially injured, considering  his history of radiation therapy.  The  prostate chips were then removed  from the bladder and on reinspection, hemostasis appeared adequate.  Therefore, the resectoscope was removed and a 22-French three-way  catheter placed.  The patient tolerated this procedure well without  complications.  He was able to be transferred to recovery room in  satisfactory condition.      Heloise Purpura, MD  Electronically Signed     LB/MEDQ  D:  11/28/2007  T:  11/29/2007  Job:  [id number]

## 2010-05-31 NOTE — Discharge Summary (Signed)
NAMELESLEE, HAUETER NO.:  [id number]   MEDICAL RECORD NO.:  [id number]          PATIENT TYPE:  INP   LOCATION:  1424                         FACILITY:  [facility]   PHYSICIAN:  Heloise Purpura, MD      DATE OF BIRTH:  [birth year]   DATE OF ADMISSION:  04/08/2007  DATE OF DISCHARGE:  04/12/2007                               DISCHARGE SUMMARY   ADMISSION DIAGNOSES:  1. Left renal mass.  2. History of atrial fibrillation.   DISCHARGE DIAGNOSES:  1. 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  2. History of atrial fibrillation.   HISTORY AND PHYSICAL:  For full details, please see admission history  physical.  Briefly, [name] is an 77 year old gentleman who was found  to have gross hematuria.  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  After undergoing extensive evaluation including a negative metastatic  evaluation, we discussed options and elected to proceed with a left  nephroureterectomy.   HOSPITAL COURSE:  On April 08, 2007, he was taken to the operating room  and underwent a laparoscopic left nephroureterectomy with open distal  ureterectomy.  He tolerated this procedure well and without  complications.  Postoperatively, he was able to be transferred to a  regular hospital room and was monitored with telemetry.  He remained  hemodynamically stable and his hematocrit on postoperative day #1 was  found to be 30.7.  In addition, he was found to have a slight rise in  his creatinine at 1.58.  This was rechecked and stabilized and was 1.48  on April 11, 2007.  His diet was gradually advanced over the course of  the next couple of days as his bowel function returned.  He was also  able to begin ambulating, although he did have some difficulty  ambulating.  A physical therapy and occupational therapy consultation  were obtained and the patient was felt to best be served by discharge to  a  skilled nursing facility following recovery from his acute  hospitalization.  On postoperative day #3, he continued to have minimal  output from his pelvic drain and a creatinine level was checked, which  was consistent with serum.  This drain was therefore removed.  He was  tolerating a regular diet and on postoperative day #4, he underwent a  cystogram which demonstrated no evidence for a bladder leak.  He  therefore had his catheter removed and underwent a voiding trial.  He  was felt to be stable for discharge to a skilled nursing facility at  this point and his pain was well-controlled with oral pain medication.   DISPOSITION:  [facility] skilled nursing facility.   DISCHARGE MEDICATIONS:  He was instructed to resume his regular home  medications excepting any aspirin, nonsteroidal anti-inflammatory drugs,  or herbal supplements.  He will continue to hold his Coumadin until his  postoperative visit, when he may be able to resume this medication.   DISCHARGE INSTRUCTIONS:  He is instructed to be ambulatory and to  continue to work with physical  therapy and occupational therapy for  rehabilitation.  He has been instructed to refrain from any strenuous  activity or driving until he follows up.   FOLLOW-UP:  [name] is scheduled to follow up with me in  approximately 10 days for removal of his staples and further  postoperative evaluation.      Heloise Purpura, MD  Electronically Signed     LB/MEDQ  D:  04/12/2007  T:  04/12/2007  Job:  [id number]

## 2010-05-31 NOTE — Discharge Summary (Signed)
[name], Alexander Barnett NO.:  [id number]   MEDICAL RECORD NO.:  [id number]          PATIENT TYPE:  INP   LOCATION:  1445                         FACILITY:  [facility]   PHYSICIAN:  Heloise Purpura, MD      DATE OF BIRTH:  [birth year]   DATE OF ADMISSION:  01/22/2008  DATE OF DISCHARGE:  02/05/2008                               DISCHARGE SUMMARY   ADMISSION DIAGNOSES:  1. Hematuria.  2. History of urothelial carcinoma.  3. History of prostate cancer status post radiation therapy.   DISCHARGE DIAGNOSIS:  1. Hematuria.  2. History of urothelial carcinoma.  3. History of prostate cancer status post radiation therapy.  4. Bacteremia.   HISTORY AND PHYSICAL:  For full details, please see admission history  and physical.  Briefly, Mr. Goeken is an 77 year old gentleman with a  history of prostate cancer status post radiation therapy.  He also has a  history of urothelial carcinoma and is status post a left  nephroureterectomy.  He also has a history of BPH and is status post a  TURP.  He has had intermittent gross hematuria and clot urinary  retention.  This was felt to be related to a nonhealing prostate and he  has undergone multiple episodes of conservative therapy with catheter  placement and irrigation.  His urine will subsequently clear and then  will spontaneously again developed hematuria.   HOSPITAL COURSE:  On January 22, 2008, he was admitted to the hospital  with hematuria and clot retention.  A Foley catheter was placed and this  was subsequently irrigated and he was placed on continuous bladder  irrigation.  He was begun on Amicar and his hematocrit was followed.  He  did require blood transfusion on January 23, 2008 when his hemoglobin  dropped from 10.2 to 8.8.  Due to the fact that his hematuria persisted  and despite conservative therapy with Amicar, he did require further  transfusion as his hemoglobin dropped to 7.3, it was decided to take him  to the  operating room for further evaluation of his entire urothelium  and possible fulguration of any bleeding site.  This procedure on  January 24, 2008 demonstrated no obvious tumors within the bladder or  right upper collecting system.  He did appear to have a nonhealing  friable prostatic urethra as previously seen without any clear bleeding  sites.  He also had two areas in his right ureter that appeared to be  possibly strictured.  These were brushed and sent for cytology and there  was no evidence for malignancy.  A ureteral stent was left in place to  exclude obstruction.  His urine at that time was clear.  He was again  placed on continuous bladder irrigation and on followup later on that  afternoon again was noted to have fairly red urine despite irrigation.  We, therefore, decided to try intravesical installation of carboprost,  which was performed.  He was monitored over the next few days and  continued to have a drop in his hemoglobin back down to 7.6 again,  requiring another 2 units blood  transfusion.  On January 26, 2008, he  was noted to have a catheter that was not draining well and had multiple  clots removed with hand irrigation and was placed back on continuous  bladder irrigation.  The carboprost was continued as well as Amicar.  However, based on the fact that on January 28, 2008, he did not appear  to be making significant progress, it was decided to consider  percutaneous nephrostomy diversion.  His hemoglobin at this point  remained stable and he was not requiring further blood transfusions.  On  January 29, 2008, his hematocrit was noted to have decreased to 22.2.  He, therefore, was administered an additional 2 units of blood.  He went  down to interventional radiology that day and attempts to place a  nephrostomy tube were unsuccessful due to his non-dilated collecting  system.  He was, therefore, monitored with plans to return to the  interventional radiology suite  later that week, once the contrast  extravasation had disappeared, for retrograde ureteral catheter  insertion so that a nephrostomy tube could potentially be placed under  fluoroscopy.  On January 30, 2008, his hemoglobin was noted to have  increased appropriately, after his blood transfusion the previous day.  He was, therefore, monitored and on January 31, 2008 again remained  stable and was taken to the interventional radiology suite.  Flexible  cystoscopy was performed and his indwelling ureteral stent was exchanged  for a 6-French open-ended ureteral catheter.  With contrast injected  through this ureteral catheter, Dr. Lowella Dandy was able to place a nephrostomy  tube.  The patient was also noted to develop low grade fever earlier  that morning up to 101 degrees Fahrenheit.  He was maintained on  ciprofloxacin empirically at that time and blood and urine cultures were  sent, which subsequently demonstrated Staphylococcus.  He was maintained  on antibiotics and remained afebrile.  His white blood count was not  elevated and his hemoglobin remained stable over the next few days.  On  February 03, 2008, it was decided to a brought in his antibiotic coverage  with vancomycin as his urine culture returned sensitive to vancomycin  but resistant to fluoroquinolones.  He remained relatively stable and  his nephrostomy tube was draining well, although intermittently would  become somewhat red.  It was decided to take him back down to the  operating room on February 04, 2008 to clear any remaining clot out of  his bladder and this was performed uneventfully.  He had no continued  bleeding and on February 05, 2008, his hematocrit remained stable and his  nephrostomy tube was draining well.  His Foley catheter was removed at  this time and he was given a voiding trial, which he passed.  He was,  therefore, felt to be safe for discharge home.  Home health care was  arranged to help with nephrostomy  care.   DISPOSITION:  Home.   DISCHARGE MEDICATIONS:  1. He will resume his regular home medications excepting any aspirin,      nonsteroidal anti-inflammatory drugs, or herbal supplements.  2. In addition, he will begin a prescription for doxycycline, which      also was an appropriate antibiotic based on his blood culture      sensitivities.  3. Finally, he also will take Vicodin as needed for pain and was      provided a prescription for this medication.   DISCHARGE INSTRUCTIONS:  He was instructed on routine care for  his  nephrostomy tube and felt comfortable with this upon discharge.  He was  also set up for home health nursing to help assist with care and  dressing changes.   FOLLOW UP:  He will be scheduled to see me in approximately 2 weeks for  further evaluation.  My plan at that time would be to clamp his  nephrostomy tube to give him a voiding trial.  We also will consider  hyperbaric oxygen therapy which may potentially help with the healing of  his prostate and reduce future instances of bleeding.      Heloise Purpura, MD  Electronically Signed     LB/MEDQ  D:  02/05/2008  T:  02/05/2008  Job:  [id number]

## 2010-05-31 NOTE — Op Note (Signed)
NAMEDESMOND, SZABO NO.:  [id number]   MEDICAL RECORD NO.:  [id number]          PATIENT TYPE:  AMB   LOCATION:  DAY                          FACILITY:  [facility]   PHYSICIAN:  Heloise Purpura, MD      DATE OF BIRTH:  August 20, 1920   DATE OF PROCEDURE:  06/17/2007  DATE OF DISCHARGE:                               OPERATIVE REPORT   PREOPERATIVE DIAGNOSES:  1. History of urothelial carcinoma.  2. Urinary retention.  3. History of prostate cancer status post radiation therapy.   POSTOPERATIVE DIAGNOSES:  1. History of urothelial carcinoma.  2. Bladder tumor.  3. Urinary retention.  4. History of prostate cancer status post radiation therapy.   PROCEDURES:  1. Cystoscopy.  2. Saline bladder washing for cytology.  3. Right retrograde pyelography.  4. Right ureteroscopy.  5. Right ureteral stent placement (0N02).  6. Transurethral resection of small bladder tumor (less than 2 cm)      with fulguration.  7. Postoperative installation of mitomycin-C (40 mg in 20 mL of      sterile water).   SURGEON:  Dr. Heloise Purpura.   ANESTHESIA:  General.   COMPLICATIONS:  None.   INTRAOPERATIVE FINDINGS:  Right retrograde pyelography demonstrated J  hooking of the right ureter with 2 narrowed stenotic and tortuous areas  in the region of the distal and mid ureter.  There were no obvious  filling defects within the ureter or within the right renal pelvis.   INDICATIONS:  Mr. Marietta is an 77 year old gentleman with a history of  prostate cancer status post radiation therapy.  He recently presented  with hematuria and was found to have a left renal pelvic tumor and  underwent a laparoscopic left nephroureterectomy which demonstrated a  low grade but and locally advanced urothelial carcinoma of the left  renal pelvis.  He also has a history of benign prostatic hyperplasia and  has subsequently developed urinary retention.  After undergoing a  urodynamic evaluation which did  demonstrate adequate detrusor function,  he elected to undergo a bladder outlet obstruction procedure.  In  addition, he was due for surveillance of his urothelial cancer.  After a  discussion regarding the potential risks, complications, and alternative  options, the patient elected to proceed with surveillance cystoscopy,  right retrograde pyelography, and transurethral vaporization of his  prostate to relieve his urinary retention.  Potential risks and  complications were discussed.  Informed consent was obtained.   DESCRIPTION OF PROCEDURE:  The patient was taken to the operating room  and a general anesthetic was administered.  He was administered broad-  spectrum IV antibiotics, placed in the dorsal lithotomy position, and  prepped and draped in the usual sterile fashion.  A preoperative time-  out was performed.  Cystourethroscopy was then performed which  demonstrated a normal anterior urethra.  He was noted to have a large  prostate with bilobar hypertrophy as well as a large median lobe.  Inspection of the bladder revealed some mild to moderate trabeculation  with areas consistent with edema from his indwelling Foley catheter.  There was also  noted to be two small papillary tumors on the left  posterior aspect of the bladder.  A saline bladder washing was obtained.  Attention then turned to the right ureteral orifice and a cone-tip  catheter was used to intubate the orifice.  Retrograde pyelogram was  performed with findings as dictated above.  Due to the fact that the  patient did have 2 separate stenotic and tortuous areas, it was decided  perform ureteroscopy for diagnostic purposes and to exclude a recurrent  a urothelial tumor.  A 0.038 Sensor guidewire was advanced up the ureter  and into the right renal pelvis.  Semi-rigid ureteroscopy was then  performed.  The tortuous and narrowed areas were somewhat difficult to  traverse but were able to be navigated with the aid of  an extra  Glidewire.  The two areas of concern did appear to be tortuous areas but  without evidence for mass or stricture.  The semi-rigid ureteroscope was  advanced up toward the proximal ureter past any of the areas of concern  on retrograde pyelography and without any abnormalities, it was  subsequently removed.  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  Therefore the Sensor  guidewire was back loaded over the cystoscope sheath and a 6 x 24 double-  J ureteral stent was advanced over the wire using Seldinger technique  with proper positioning under fluoroscopic and cystoscopic guidance.  The wire was then removed with a good curl noted in the renal pelvis as  well as in the bladder.  A string tether was left on the stent.  Attention then turned to the bladder and the previously seen bladder  tumors were then resected transurethrally using the cold cup forceps.  Once these tumors were removed, additional sections into the muscularis  propria of the bladder were taken.  The cystoscope was then removed and  replaced with the resectoscope.  The Olympus bipolar vaporization button  was then used to provide coagulation as this was available for the  planned TUVP.  The patient's bladder was then emptied and reinspected  and hemostasis appeared excellent.  Due to the fact that patient did  have a bladder tumor, it was decided not to proceed with the  transurethral vaporization of his prostate due to the potential for  tumor seeding.  The resectoscope was removed and an 18-French coude  catheter was inserted.  Mitomycin C 40 mg in 20 mL of sterile water was  then inserted into the patient's catheter and left for a 1-hour dwell  time.  The patient appeared to tolerate the procedure well without  complications.  He was able to be awakened and transferred to the  recovery  unit in satisfactory condition.      Heloise Purpura, MD  Electronically Signed     LB/MEDQ  D:  06/17/2007  T:  06/17/2007  Job:  [id number]

## 2010-05-31 NOTE — Op Note (Signed)
Alexander Barnett, Alexander Barnett NO.:  [id number]   MEDICAL RECORD NO.:  [id number]          PATIENT TYPE:  OIB   LOCATION:  1428                         FACILITY:  [facility]   PHYSICIAN:  Heloise Purpura, MD      DATE OF BIRTH:  1920/08/21   DATE OF PROCEDURE:  12/31/2007  DATE OF DISCHARGE:  01/01/2008                               OPERATIVE REPORT   PREOPERATIVE DIAGNOSES:  Clot urinary retention.   POSTOPERATIVE DIAGNOSES:  Clot urinary retention.   PROCEDURES:  1. Cystoscopy.  2. Evacuation of clot from bladder.   ANESTHESIA:  LMA.   COMPLICATIONS:  None.   INDICATION:  [name] is an 77 year old gentleman with a history of  BPH, prostate cancer, urothelial carcinoma, and recent hematuria.  He  was having gross hematuria and some difficulty intermittently developing  urinary retention over the last 48 hours.  He presented to the office  today and was seen on cystoscopy to have a large formed clot within his  bladder.  Attempts were made to irrigate the clot  via a catheter which  were unsuccessful.  After discussing options, it was recommended and he  did agree to proceed with cystoscopy under anesthesia with clot  evacuation from his bladder.  The potential risks, complications, and  alternative options were discussed in detail with the patient and  informed consent was obtained.   DESCRIPTION OF PROCEDURE:  The patient was taken to the operating room  and a general anesthetic was administered.  He was given preoperative  antibiotics, placed in the dorsal lithotomy position, and prepped and  draped in the usual sterile fashion.  Next, a preoperative time-out was  performed.   Cystourethroscopy was then performed which demonstrated a large formed  clot within the bladder.  The patient's prostatic urethra had  considerable necrotic tissue consistent with his recent history of a  TURP.  A 26-French resectoscope sheath was then placed into the bladder  under direct  vision.  The clot was evacuated out using [place]y syringe  irrigation.  The bladder was then reinspected and all clot was removed.  The resectoscope was then removed and a 22-French three-way catheter was  placed.  The patient tolerated the procedure well without complications.  He was able to be awakened and transferred to recovery unit in  satisfactory condition.      Heloise Purpura, MD  Electronically Signed     LB/MEDQ  D:  12/31/2007  T:  01/01/2008  Job:  [id number]

## 2010-05-31 NOTE — Op Note (Signed)
NAMESEVERIANO, UTSEY NO.:  [id number]   MEDICAL RECORD NO.:  [id number]          PATIENT TYPE:  EMS   LOCATION:                               FAC[facility]   PHYSICIAN:  Excell Seltzer. Annabell Howells, M.D.    DATE OF BIRTH:  [birth year]   DATE OF PROCEDURE:  DATE OF DISCHARGE:                               OPERATIVE REPORT   CONSULTATION AND PROCEDURE NOTE:  Mr. Kittell is an 77 year old white male with history of prostate cancer  with prior radiation.  He has also had electrovaporization of the  prostate and has recently been seen for gross hematuria.  He has a 20-  [place] Foley indwelling has had recurrent problems with clot and  obstruction.  Earlier today his of the urine stopped flowing from the  catheter and he began to have blood clots around the catheter.  He has  developed moderate discomfort in his abdomen.   On examination, his abdomen soft and flat with suprapubic tenderness.  There is a Foley at the meatus.  Penis is otherwise unremarkable.   The catheter was disconnected from the bag and irrigated with normal  saline with return of several old clots.  Eventually I was able to get  the urine completely clear and reconnected it to leg bag drainage.   IMPRESSION:  Clot retention in a patient with a history of prostate  cancer and bladder tumors.   PLAN:  Follow up with Dr. Laverle Patter as planned.      Excell Seltzer. Annabell Howells, M.D.  Electronically Signed     JJW/MEDQ  D:  11/09/2007  T:  11/09/2007  Job:  [id number]

## 2010-05-31 NOTE — Op Note (Signed)
NAME:  Alexander Barnett, Alexander Barnett NO.:  [id number]   MEDICAL RECORD NO.:  [id number]         PATIENT TYPE:  LOIB   LOCATION:  [facility]                         FACILITY:  [facility]   PHYSICIAN:  Heloise Purpura, MD      DATE OF BIRTH:  05/26/1920   DATE OF PROCEDURE:  07/30/2008  DATE OF DISCHARGE:                               OPERATIVE REPORT   PREOPERATIVE DIAGNOSES:  1. 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  2. Prostate cancer, status post radiation therapy.  3. History of radiation cystitis.   POSTOPERATIVE DIAGNOSES:  1. 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  2. Prostate cancer, status post radiation therapy.  3. History of radiation cystitis.   PROCEDURE:  1. Cystoscopy.  2. Right retrograde pyelography.  3. Transurethral resection of bladder tumor (1.5 cm).   SURGEON:  Heloise Purpura, MD   ASSISTANT:  Dr. Edward Qualia.   ANESTHESIA:  General.   COMPLICATIONS:  None.   SPECIMENS:  1. Left bladder tumor.  2. Base of left bladder tumor.   DISPOSITION OF SPECIMENS:  To pathology.   INDICATIONS:  Alexander Barnett is an 77 year old gentleman with a history of  urothelial carcinoma of the left renal pelvis status post left  nephroureterectomy.  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  He was recently found to have a  papillary tumor on cystoscopic surveillance and it was recommended that  he undergo transurethral resection and the above procedures.  The  potential risks, complications, and alternative options were discussed  in detail and informed consent was obtained.   DESCRIPTION OF PROCEDURE:  The patient was taken to the operating room  and general anesthesia was induced.  He was given preoperative  antibiotics, placed in the dorsal lithotomy position, and prepped and  draped in the usual sterile fashion.  Next a preoperative time-out was  performed.  Cystourethroscopy was then performed which demonstrated a  normal anterior urethra.  The prostatic urethra was noted to have some  healed urothelium compared to his prior cystoscopy.  However, he did  still have friable prostate tissue with some necrotic tissue in the  prostatic urethra.  In addition, his prostate did appear to still be  enlarged.  Inspection of the bladder revealed a papillary bladder tumor  on the left lateral wall measuring approximately 1.5 cm.  A systematic  examination of bladder revealed no other tumors.  Previous urine  cytology from a saline barbotage in the office was negative.  Inspection  of the right ureteral orifice was then performed and this was cannulated  with a cone-tip catheter.  Omnipaque contrast was injected which  demonstrated persistent dilation of the right ureter with a narrowed  area at the level of the iliac vessels consistent with the patient's  prior retrograde pyelography.  This narrowed area had been previously  brushed and evaluated ureteroscopically and was negative for recurrent  malignancy.  It was somewhat difficult to fill out the  renal pelvis and  an attempt was made to place a 6-French ureteral catheter and to  navigate this with a wire.  However, this proved to be difficult and due  to the wish to avoid any more manipulation than necessary considering  his history of severe radiation cystitis, it was decided to stop at this  point and address the patient's bladder tumor.  The visualization that  was seen of the renal pelvis did not indicate any evidence of recurrent  tumor.  Inspection of the left bladder tumor was then performed after  the cystoscope was replaced with the 28-French resectoscope sheath.  Utilizing with cutting loop resection, the tumor was then resected and  sent for pathologic analysis.  Additional biopsies were then performed  of the tumor base with the cold cup biopsy forceps.  Hemostasis was   achieved with coagulation.  Utilizing the resection loop.  The bladder  was then emptied and reinspected any clots were removed from the bladder  and there appeared to be fairly good hemostasis aside from some friable  bleeding from the prostatic urethra and bladder neck.  A 22-French  catheter was then placed and the procedure was ended.  He tolerated the  procedure well without complications.  He was able to be awakened and  transferred to recovery unit in satisfactory condition.      Heloise Purpura, MD  Electronically Signed     LB/MEDQ  D:  07/30/2008  T:  07/30/2008  Job:  [id number]

## 2010-05-31 NOTE — Op Note (Signed)
NAME:  Alexander Barnett, Alexander Barnett NO.:  [id number]   MEDICAL RECORD NO.:  [id number]          PATIENT TYPE:  AMB   LOCATION:  N[facility]C                         FACILITY:  [facility]   PHYSICIAN:  Heloise Purpura, MD      DATE OF BIRTH:  31-Aug-1920   DATE OF PROCEDURE:  07/18/2007  DATE OF DISCHARGE:                               OPERATIVE REPORT   PREOPERATIVE DIAGNOSIS:  Urinary retention.   POSTOPERATIVE DIAGNOSIS:  Urinary retention.   PROCEDURES:  1. Cystoscopy.  2. Transurethral vaporization of the prostate.   SURGEON:  Heloise Purpura, M.D.   ANESTHESIA:  General.   COMPLICATIONS:  None.   INDICATIONS:  Mr. Waring is an 78 year old gentleman with a history of  prostate cancer and urothelial cancer of the left renal pelvis and  bladder.  He has a longstanding history of benign prostatic hyperplasia  and has undergone a transurethral microwave therapy procedure as well as  transurethral needle ablation in the distant past.  Most recently, he  has been managed with medical therapy, but did develop urinary retention  refractory to further medical manipulation and multiple voiding trials.  He underwent a urodynamic evaluation which demonstrated adequate  detrusor function and findings consistent with outlet obstruction.  We  discussed management options, and he did wish to proceed with an outlet  obstruction procedure.  Based on the fact that he had undergone prior  radiation therapy with combination external radiation and brachytherapy,  he was counseled about the increased risk for possible incontinence as  well as the other potential risks, complications, and alternative  treatment options associated with the above procedures.  Informed  consent was obtained.   DESCRIPTION OF PROCEDURE:  The patient was taken to the operating room,  and a general anesthetic was administered.  He was given preoperative  antibiotics, placed in the dorsal lithotomy position, and prepped and  draped in the usual sterile fashion.  Next, a preoperative time-out was  performed.  Cystourethroscopy was then performed, which demonstrated a  normal anterior urethra.  The prostatic urethra was noted to have large  coapting lateral lobes without a significant median lobe.  On inspection  of his bladder, he was noted to have some areas of necrotic tissue at  the site of his prior transurethral resection for his bladder tumor.  However, no new bladder tumors or other obvious mucosal abnormalities  were identified, aside from previously seen grade 1 trabeculation and  bladder diverticula.  Once the bladder had been examined, the cystoscope  was removed with the 24-French resectoscope sheath.  The bipolar  transurethral vaporization loop was then inserted, and the prostate  tissue from the bladder neck back to the verumontanum was resected first  between 5 and 7 o'clock.  Resection then proceeded of the lateral lobes,  with care to identify and preserve the right ureteral orifice.  Care was  taken not to resect beyond the verumontanum.  Once all adequate prostate  was resected down to the prostatic capsule, all chips were removed with  the [facility] evacuator.  Inspection revealed all chips to be removed, and  hemostasis was achieved with coagulation.  The resectoscope was then removed, and a 22-French 3-way catheter was  placed and irrigated.  The patient was placed on continuous bladder  irrigation and was able to be awakened and transferred to the recovery  room without difficulty.  He tolerated the procedure well and without  complications.      Heloise Purpura, MD  Electronically Signed     LB/MEDQ  D:  07/18/2007  T:  07/18/2007  Job:  [id number]

## 2010-05-31 NOTE — Op Note (Signed)
NAMEDOREAN, DANIELLO NO.:  [id number]   MEDICAL RECORD NO.:  [id number]          PATIENT TYPE:  INP   LOCATION:  1445                         FACILITY:  [facility]   PHYSICIAN:  Heloise Purpura, MD      DATE OF BIRTH:  [birth year]   DATE OF PROCEDURE:  02/04/2008  DATE OF DISCHARGE:                               OPERATIVE REPORT   PREOPERATIVE DIAGNOSES:  1. Hematuria  2. History of prostate cancer status post radiation therapy.  3. Clot within the bladder.   POSTOPERATIVE DIAGNOSES:  1. Hematuria  2. History of prostate cancer status post radiation therapy.  3. Clot within the bladder.   PROCEDURE:  1. Cystoscopy.  2. Clot evacuation.   SURGEON:  Dr. Heloise Purpura.   ANESTHESIA:  General.   COMPLICATIONS:  None.   INDICATION:  Mr. Turnbough is a gentleman who has been hospitalized for the  last week and half with persistent bleeding from his prostatic urethra  felt to be from a nonhealing prostate.  He has undergone conservative  therapy and has since proceeded with a nephrostomy tube and has remained  hemodynamically stable over the past few days.  He does have persistent  clot in his bladder which was seen on flexible cystoscopy late last  week.  Based on the fact that he is currently remaining stable and is  set to be discharged home, it was felt that would be ideal to remove all  remaining clot from the bladder in anticipation of future voiding trials  as an outpatient.   On February 04, 2008, the patient was taken to the operating room and was  placed in the dorsal lithotomy position.  He had been on preoperative  antibiotics and was placed in the appropriate position and prepped and  draped in the usual sterile fashion.  Next, a preoperative time out was  performed.  Cystourethroscopy was then performed with a 22-French scope  which revealed a moderate amount of clot within the bladder.  This was  evacuated with a Toomey  syringe.  Once all clot was  removed for the bladder, the bladder was  reinspected, and there was no evidence of any remaining clot.  A 22-  French catheter was then placed.  The patient tolerated the procedure  well without complications.      Heloise Purpura, MD  Electronically Signed     LB/MEDQ  D:  02/04/2008  T:  02/04/2008  Job:  [id number]

## 2010-05-31 NOTE — Consult Note (Signed)
NAMEJANIS, CUFFE NO.:  [id number]   MEDICAL RECORD NO.:  [id number]          PATIENT TYPE:  OUT   LOCATION:  XRAY                         FACILITY:  [facility]   PHYSICIAN:  Barry Dienes. Eloise Harman, M.D.DATE OF BIRTH:  September 16, 1920   DATE OF CONSULTATION:  DATE OF DISCHARGE:                                 CONSULTATION   INDICATION FOR CONSULTATION:  Persistent gross hematuria from radiation  cystitis and urethritis.   PHYSICIAN REQUESTING CONSULTATION:  Heloise Purpura, MD   HISTORY OF PRESENT ILLNESS:  The patient is an 77 year old Caucasian man  with a history of prostate cancer status post external beam radiation  therapy in 2008.  He also has a history of urethral carcinoma and is  status post a left neck nephroureterectomy.  Prior to his diagnosis of  prostate cancer,  he had benign prostatic hypertrophy and a TURP  procedure.  He has had problems with intermittent gross hematuria and  clot causing urinary retention.  During a recent hospitalization for  gross hematuria, he lost so much blood that he required transfusion of  12 units of packed red blood cells.  In early January 2010, he was  admitted to the hospital because of gross hematuria and clot retention.  He was treated with Amicar and Foley catheter placement.  On January 24, 2008 he had cystoscopy done that revealed a nonhealing, friable  prostatic urethra without a clear source of bleeding.  He also had two  areas in his right ureter that appeared to be possibly strictured.  They  were brushed and sent for cytology.  There was no evidence of malignancy  on cytology.  A ureteral stent was left in place.  Intravesical  instillation of carboprost was performed.  He continued to have  hematuria and clot obstruction of urinary outflow.  He had placement of  a right-sided nephrostomy tube without difficulty.  Subsequently, his  nephrostomy tube was removed when he passed a voiding trial.  Due to the  intermittent nature of his gross hematuria, he was sent to [place] for  consideration of hyperbaric oxygen treatment.  He has not had gross  hematuria for few weeks.  However, that has been the nature of his  problem that will clear in a few weeks of time and then recur.   He has no history of COPD, claustrophobia, seizures, or use of drugs  that would preclude a trial of hyperbaric oxygen treatment.   PAST MEDICAL HISTORY:  1. Paroxysmal atrial fibrillation in 2008 for which he was not treated      with amiodarone.  2. Anemia.  3. Benign prostate hypertrophy, status post TUNA and TURP.  4. Osteoarthritis.  5. Hypertension.   MEDICATIONS:  1. Lisinopril 10 mg once daily.  2. Bisoprolol and hydrochlorothiazide 2.5/6.25 once daily.  3. Proscar 5 mg daily.  4. Celebrex 200 mg once daily.   PAST SURGICAL HISTORY:  Left ureteronephrectomy, TUNA procedure, TURP  procedure, TUR procedure and cystoscopy with clot evacuation in January  2010 and placement of right-sided nephrostomy tube.   ALLERGIES:  No known drug allergies.  FAMILY HISTORY:  Noncontributory.   REVIEW OF SYSTEMS:  He has not had recent fever or chills.  He has not  had problems with shortness of breath, cough, chest pain or  palpitations, nausea, vomiting, diarrhea, constipation, anxiety, or  depression.  There is no history of claustrophobia.   INITIAL PHYSICAL EXAMINATION:  VITAL SIGNS:  Blood pressure 188/78,  pulse 66, respirations 16, and temperature 98.1.  GENERAL:  He is a well-nourished, well-developed Caucasian man who is in  no apparent distress.  HEAD, EYES, EARS, NOSE AND THROAT:  Within normal limits.  His ear  canals and tympanic membranes were normal.  NECK:  Supple without jugular venous distention or carotid bruit.  CHEST:  Clear to auscultation.  HEART:  Regular rate and rhythm without significant murmur or gallop.  ABDOMEN:  Normal bowel sounds and no hepatosplenomegaly or tenderness.  EXTREMITIES:   Without cyanosis, clubbing, or edema.  NEUROLOGIC:  He was alert and well-oriented, had mildly decreased  hearing, but otherwise cranial nerves II-XII were normal, sensory exam  was grossly normal, motor strength was grossly normal.   IMPRESSION AND RECOMMENDATIONS:  Gross hematuria:  Given the previous  workup this most likely secondary to radiation injury to the prostatic  urethra as visualized on his recent cystoscopy.  He appears to be an  excellent candidate for hyperbaric oxygen treatment.   PLAN:  We will obtain a baseline chest x-ray to exclude the possibility  of COPD with blebs.  Following this, he will be instituted on hyperbaric  oxygen treatment with a plan at 2.4 ATA for 90 minutes for 30 to 40  sessions.  He would be given air breaks of 5 minutes  every 30 minutes  to prevent pulmonary toxicity.  In addition, he was given a prescription  for Xanax 0.25 mg p.o. before HBO treatment should he develop anxiety or  claustrophobia.  He was advised that his blood pressure was somewhat  elevated and he should have a routine reevaluation with his primary care  physician.           ______________________________  Barry Dienes. Eloise Harman, M.D.     DGP/MEDQ  D:  02/25/2008  T:  02/25/2008  Job:  [id number]   cc:   Heloise Purpura, MD  Oley Balm. Georgina Pillion, M.D.  Armanda Magic, M.D.

## 2010-05-31 NOTE — H&P (Signed)
Alexander Barnett, ADAMI NO.:  [id number]   MEDICAL RECORD NO.:  [id number]          PATIENT TYPE:  INP   LOCATION:  1832                         FACILITY:  [facility]   PHYSICIAN:  Andres Shad. Rudean Curt, MD     DATE OF BIRTH:  September 01, 1920   DATE OF ADMISSION:  07/10/2006  DATE OF DISCHARGE:                              HISTORY & PHYSICAL   CHIEF COMPLAINT:  Irregular heart beat.   HISTORY OF PRESENT ILLNESS:  Alexander Barnett is an 77 year old male who was  well until this morning when he noticed that he had an irregular heart  beat at approximately lunch time.  He had played a round of golf this  morning without any physical discomfort or symptoms.  He then went out  to eat lunch and noticed during lunch that had a very forceful heart  beat that he could feel vaguely in his neck.  He took his pulse and  found that it was very irregular and had an irregularly irregular  pattern.  He called EMS who came to his house and assessed him and found  him to be in atrial fibrillation with a rapid ventricular response and a  ventricular rate of approximately 180 beats per minute.  He was given a  dose of 5 mg of diltiazem en route to the hospital which slowed his rate  down to the 80's.  Since that time he has been asymptomatic.   REVIEW OF SYSTEMS:  Negative for chest pain, negative for pain elsewhere  in the body, negative for dizziness or presyncopal sensations, negative  for difficulty breathing.   PAST MEDICAL HISTORY:  1. Prostate cancer managed with hormonal therapy at the [facility] [place].  2. Hypertension.  3. Arthritis.  4. Glaucoma.   MEDICATIONS INCLUDE:  1. Hytrin 5 mg daily.  2. Hydrochlorothiazide 6.5 mg daily.  3. Voltaren 75 mg daily.   ALLERGIES:  None.   SOCIAL HISTORY:  Remote smoking more than 40 years ago.  Approximately 2  drinks of alcohol nightly.   PHYSICAL EXAMINATION:  VITAL SIGNS:  Temperature 98.2, heart rate 60,  blood  pressure 135/76, respiratory rate 16, oxygen saturation 96-100% on  room air.  GENERAL APPEARANCE:  The patient was a well-appearing, elderly white  male who appeared to be in good health.  HEENT:  Mucous membranes were moist, sclerae anicteric. There is no  pallor.  LUNGS:  Clear to auscultation bilaterally.  CARDIOVASCULAR:  Regular, normal S1 and S2, no murmurs, rubs, or  gallops.  ABDOMEN:  Soft, nontender, nondistended, no organomegaly, bowel sounds  were hyperactive.  EXTREMITIES:  Warm, well perfused, no edema.   LABORATORY STUDIES:  Potassium was 3.4, electrolytes were without  normal.  Glucose was elevated at 158, creatinine was 1.05 BUN was 22.  White blood cell count 7300, hemoglobin 12.8, platelets 139,000.  Thyroid studies are pending.  Cardiac markers were not obtained in the  emergency department.   An electrocardiogram showed atrial fibrillation with a rate of 88 beats  per minute, secure S complex was narrow  and there were no ischemic  changes.  Later on the patient had reverted to sinus rhythm with a rate  of approximately 60 beats per minute and normal appearing P waves.   ASSESSMENT AND PLAN:  This is an 77 year old who is generally in good  health who comes in with new diagnosis of atrial fibrillation with rapid  ventricular response.  He has reverted to sinus rhythm following a  single dose of Cardizem en route to the hospital.  While he has few risk  factors for cardiac ischemia, he does not have suggestive EKG findings  or symptoms but it will be important to monitor the patient both with  telemetry and with serial cardiac enzymes to ensure that he has not  having any myocardial ischemia.  Additionally it will be important to  replete his potassium which is low, and administer IV fluids in case he  is somewhat dehydrated as suggested by his elevated BUN.  I will also  begin the patient on a heparin infusion and the Coumadin anticoagulation  protocol and  order a 2D echocardiogram to exclude structural  abnormalities in the heart.  He will remain on telemetry to ensure that  any tachycardia or bradycardia is quickly acted upon.      Andres Shad. Rudean Curt, MD  Electronically Signed     PML/MEDQ  D:  07/10/2006  T:  07/10/2006  Job:  [id number]   cc:   Oley Balm. Georgina Pillion, M.D.

## 2010-05-31 NOTE — Assessment & Plan Note (Signed)
[facility]   NAME:  Alexander Barnett, Alexander Barnett NO.:  [id number]   MEDICAL RECORD NO.:  [id number]      DATE OF BIRTH:  [birth year]   PHYSICIAN:  Barry Dienes. Eloise Harman, M.D.     VISIT DATE:                                   OFFICE VISIT   ADDENDUM   He has had gross hematuria that is in unspecified aspect of the  radiation treatment (ICD-9 code 23).           ______________________________  Barry Dienes Eloise Harman, M.D.     DGP/MEDQ  D:  05/12/2008  T:  05/13/2008  Job:  [id number]

## 2010-05-31 NOTE — Discharge Summary (Signed)
[name], Alexander Barnett              ACCOUNT NO.:  [id number]   MEDICAL RECORD NO.:  [id number]          PATIENT TYPE:  INP   LOCATION:  2005                         FACILITY:  [facility]   PHYSICIAN:  Michelene Gardener, MD    DATE OF BIRTH:  04-07-1920   DATE OF ADMISSION:  07/10/2006  DATE OF DISCHARGE:  07/12/2006                               DISCHARGE SUMMARY   PRIMARY PHYSICIAN:  Oley Balm. Georgina Pillion, M.D.   DISCHARGE DIAGNOSES:  1. New-onset atrial fibrillation.  2. History of prostate cancer.  3. Hypertension.  4. Arthritis.  5. Glaucoma.   DISCHARGE MEDICATIONS:  1. Coumadin 5 mg p.o. once daily.  2. Hytrin 5 mg p.o. once daily.  3. Hydrochlorothiazide 6.5 mg p.o. twice daily.  4. Ziac 5 mg p.o. once daily.  5. Voltryn  75 mg p.o. once daily.  6. Multivitamin one tablet p.o. once daily.  7. Vitamin B12.  8. Vitamin D once daily.   CONSULTATIONS:  None.   PROCEDURES:  None.   FOLLOW-UP:  Follow-up appointment with Dr. Lajean Manes on Monday at 11  in the morning.   COURSE OF HOSPITALIZATION:  New-onset atrial fibrillation.  This patient  present to the ER complaining of irregular heart beat.  His initial EKG  showed atrial fibrillation.  During the hospitalization, he did have in  and out atrial fibrillation of sinus rhythm, which put him in the  category of paroxysmal atrial fibrillation.  The patient was started on  heparin and was started on Coumadin  in the hospital.  His INR was 1.1.  The patient expressed a very strong desire to go home and he has been  very stable in the hospital.  Denies any chest pain.  Denies any  shortness of breath.  His heart rate has been controlled.  After  discussion with the pharmacy, it was decided to send him home with  Coumadin 5 mg once daily and sq Lovenox.  He can see his primary doctor  in 2 to 3 days to adjust his Coumadin dose.  He was advised to come to  the ER if he developed increasing palpitations, chest pain, or shortness  of  breath, or if he developed any evidence of bleeding.  I already spoke  to the office of Dr. Lajean Manes, and put him on for appointment on  Monday at 11 in the morning to check his INR.  Thank you so much Time  spent is 40 minutes.   DISPOSITION:  Otherwise the medical condition has remained stable and  the patient was to continue the same medications taken at home.      Michelene Gardener, MD  Electronically Signed     NAE/MEDQ  D:  07/12/2006  T:  07/12/2006  Job:  [id number]   cc:   Oley Balm. Georgina Pillion, M.D.

## 2010-05-31 NOTE — Discharge Summary (Signed)
Alexander Barnett, STARKS NO.:  [id number]   MEDICAL RECORD NO.:  [id number]          PATIENT TYPE:  OIB   LOCATION:  1439                         FACILITY:  [facility]   PHYSICIAN:  Heloise Purpura, MD      DATE OF BIRTH:  Jul 30, 1920   DATE OF ADMISSION:  07/30/2008  DATE OF DISCHARGE:  07/31/2008                               DISCHARGE SUMMARY   PREOPERATIVE DIAGNOSIS:  Urothelial carcinoma of the bladder.   DISCHARGE DIAGNOSIS:  The same.   HISTORY:  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  He  recently was found to have a recurrent bladder tumor and was scheduled  to undergo transurethral resection of his bladder tumor.   HOSPITAL COURSE:  He was taken to the operating room on July 30, 2008  and underwent transurethral resection of his bladder tumor.  He  tolerated this procedure well and was monitored overnight with an  indwelling catheter.  His urine remained clear on the morning of  postoperative day one and he was otherwise hemodynamically stable.  His  catheter was removed and he passed a voiding trial and was able to be  discharged home.   DISPOSITION:  Home.   DISCHARGE MEDICATIONS:  He will resume all of his regular home  medications and has been provided a prescription for ciprofloxacin and  Vicodin.   FOLLOWUP:  He will follow up in the next few weeks to discuss his  surgical pathology results.      Heloise Purpura, MD  Electronically Signed     LB/MEDQ  D:  07/31/2008  T:  07/31/2008  Job:  [id number]

## 2010-05-31 NOTE — Discharge Summary (Signed)
NAMEMETE, PURDUM NO.:  [id number]   MEDICAL RECORD NO.:  [id number]          PATIENT TYPE:  OIB   LOCATION:  1426                         FACILITY:  [facility]   PHYSICIAN:  Heloise Purpura, MD      DATE OF BIRTH:  [birth year]   DATE OF ADMISSION:  11/28/2007  DATE OF DISCHARGE:  11/29/2007                               DISCHARGE SUMMARY   ADMISSION DIAGNOSES:  1. Urinary retention.  2. Gross hematuria.  3. History of bladder cancer.  4. History of prostate cancer.   DISCHARGE DIAGNOSES:  1. Urinary retention.  2. Gross hematuria.  3. History of bladder cancer.  4. History of prostate cancer.   HISTORY:  [name] is an 77 year old gentleman with a history of  urothelial carcinoma and prostate cancer.  He recently developed gross  hematuria and difficulty voiding and has also had recurrent urinary  tract infections.  He required placement of a Foley catheter due to his  voiding difficulties and it was decided to proceed with cystoscopy in  the operating room with possible transurethral resection of a bladder  tumor or the prostate as might be indicated based on intraoperative  findings.   HOSPITAL COURSE:  On November 28, 2007, the patient was taken to the  operating room.  No bladder tumors were identified on cystoscopy but he  did appear to have poorly healing prostate tissue from his prior TURP.  It was felt that this was most likely the cause of his voiding symptoms  as well as his hematuria.  He therefore underwent a repeat resection of  his prostate which was performed without complications or difficulty.  The patient was then admitted to the hospital and an indwelling Foley  catheter was left in place.  The following morning his catheter was  removed.  He was given a voiding trial.  Initially, he had difficulty  voiding and his first postvoid residual was 400 mL.  However, he began  voiding better throughout the day and a subsequent postvoid residual  was  90 mL.  He was therefore able to be discharged home in excellent  condition.   DISPOSITION:  Home.   DISCHARGE MEDICATIONS:  He was instructed to resume all of his regular  home medications including doxycycline which he was taking for a  positive urine culture preoperatively.   FOLLOWUP:  He will follow up as scheduled in January for further  evaluation and will have a postvoid residual urine checked at that time.      Heloise Purpura, MD  Electronically Signed     LB/MEDQ  D:  11/29/2007  T:  11/30/2007  Job:  [id number]

## 2010-05-31 NOTE — Op Note (Signed)
Alexander Barnett, Alexander Barnett NO.:  [id number]   MEDICAL RECORD NO.:  [id number]          PATIENT TYPE:  INP   LOCATION:  1445                         FACILITY:  [facility]   PHYSICIAN:  Heloise Purpura, MD      DATE OF BIRTH:  22-Mar-1920   DATE OF PROCEDURE:  01/24/2008  DATE OF DISCHARGE:                               OPERATIVE REPORT   PREOPERATIVE DIAGNOSES:  1. Gross hematuria.  2. 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  3. Prostate cancer status post external beam radiation therapy.   POSTOPERATIVE DIAGNOSES:  1. Gross hematuria.  2. 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  3. Prostate cancer status post external beam radiation therapy.  4. Right ureteral stricture.   PROCEDURES:  1. Cystoscopy.  2. Right retrograde pyelography.  3. Right ureteroscopy with ureteral brushing of right ureteral      stricture.  4. Clot evacuation.  5. Fulguration of bleeding site in bladder and prostatic urethra.   SURGEON:  Heloise Purpura, MD   ANESTHESIA:  General.   COMPLICATIONS:  None.   ESTIMATED BLOOD LOSS:  Minimal.   DRAINS:  24 French three way Simplastic hematuria catheter with  continuous bladder irrigation.   INDICATIONS:  Alexander Barnett is an 77 year old gentleman with a history of  urothelial carcinoma and prostate cancer with a history of radiation  therapy and is status post a nephroureterectomy.  He has had  intermittent gross hematuria over the past few months which have  required multiple catheterizations with irrigations and clot evacuations  as well as a transurethral resection of the prostate and fulguration  procedures.  He presented a few weeks ago with acute blood loss anemia  requiring transfusions and catheter placement along with hand  irrigation.  His urine subsequently cleared and remained clear for  approximately 2 weeks.  He again developed a sudden onset of painless  profuse  gross hematuria and presented to our office for further  evaluation.  He was therefore admitted to the hospital and again  developed acute blood loss anemia requiring blood transfusion.  It was  therefore felt necessary to take him back to the operating room for  further evaluation and to evaluate his entire urinary tract considering  his history of urothelial carcinoma.  The potential risks, complications  and alternative treatment options were discussed and informed consent  was obtained.   DESCRIPTION OF PROCEDURE:  The patient was taken to the operating room  and a general anesthetic was administered.  He was given preoperative  antibiotics, placed in the dorsal lithotomy position and prepped and  draped in the usual sterile fashion.  Next a preoperative time-out was  performed.  Cystourethroscopy was then performed which demonstrated a  normal anterior urethra.  The prostatic urethra continued to demonstrate  nonhealing friable tissue within the prostatic urethra.  No clear acute  bleeding site was identified.  On inspection of the bladder there was  noted to be a large formed clot within the bladder lumen.  Again, there  was no evidence for any active bleeding sites.  The bladder was examined  systematically and did have moderate trabeculation of multiple  diverticula.  However, no obvious recurrent bladder tumors were  identified.  The right ureteral orifice was identified and intubated  with a 6 French ureteral catheter.  Contrast was injected and this  demonstrated a dilated ureter particularly in the distal segment without  evidence of frank hydronephrosis in the renal pelvis.  There were two  areas of narrowing in the distal ureter which were consistent with an  apple core defect.  It was therefore decided to further pursue these  especially considering the patient's history of urothelial carcinoma.  A  0.038 sensor guidewire was advanced into the ureteral orifice.  The   patient had been noted to have extensive J hooking of his ureter due to  prostatic enlargement and the sensor guidewire did not pass easily.  Therefore a 0.038 Glidewire was eventually manipulated past the J  hooking of the ureter and past the narrowed segments up into the renal  pelvis.  Again, there were no obvious other abnormalities or filling  defects within the course of the ureter.  Semi-rigid ureteroscopy was  then performed.  These narrowed areas were found to be consistent with  folds of the ureter and possible stricture sites, although no obvious  tumor was noted.  Ureteral brushings were then taken of each of these  areas and sent for cytologic analysis.  The ureteroscope was then  removed and the Glidewire was back loaded over a cystoscope sheath.  A 6  x 24 double-J ureteral stent was then advanced over the wire and  positioned utilizing Seldinger technique under fluoroscopic and  cystoscopic guidance.  A good curl was noted in the renal pelvis as well  as in the bladder after the wire was removed.  A 28 French resectoscope  sheath was then placed in the bladder and the patient's formed clot was  evacuated out of the bladder with Toomey syringe irrigation.  Reinspection revealed again no active bleeding sites although some  friable areas along the prostate/bladder neck junction.  These areas  were fulgurated with the loop cautery and it was decided to replace the  patient's catheter at this point.  Therefore a 19 [place] three way  Simplastic hematuria catheter was placed and continuous bladder  irrigation with normal saline was reinstituted.  The patient tolerated  the procedure well without complications.  He was able to be extubated  and transferred to the recovery unit in satisfactory condition.      Heloise Purpura, MD  Electronically Signed     LB/MEDQ  D:  01/24/2008  T:  01/25/2008  Job:  [id number]

## 2010-05-31 NOTE — Op Note (Signed)
[name], Alexander Barnett NO.:  [id number]   MEDICAL RECORD NO.:  [id number]          PATIENT TYPE:  INP   LOCATION:  1615                         [facility]ACILITY:  [facility]   PHYSICIAN:  Heloise Purpura, MD      DATE OF BIRTH:  04/06/1920   DATE OF PROCEDURE:  03/05/2007  DATE OF DISCHARGE:                               OPERATIVE REPORT   PREOPERATIVE DIAGNOSIS:  1. Hematuria.  2. Left renal pelvic tumor.  3. Benign prostatic hyperplasia.  4. Prostate cancer, status post radiation therapy.   POSTOPERATIVE DIAGNOSIS:  1. Hematuria.  2. Left renal pelvic tumor.  3. Benign prostatic hyperplasia.  4. Prostate cancer, status post radiation therapy.   PROCEDURE:  1. Cystoscopy.  2. Clot evacuation.  3. Transurethral resection of the prostate.   SURGEON:  Heloise Purpura, M.D.   ANESTHESIA:  General.   COMPLICATIONS:  None.   INDICATIONS FOR PROCEDURE:  The patient is an 77 year old gentleman with  a history of prostate cancer, status post radiation therapy.  He is also  on chronic anticoagulation for atrial fibrillation.  He was recently  found to have gross hematuria and underwent an evaluation which  demonstrated a tumor of the left renal pelvis consistent with urothelial  carcinoma.  He was taken to the operating room and underwent a  diagnostic ureteroscopy which confirmed his diagnosis.  Since that  procedure, he has continued to have gross hematuria despite management  with catheter and bladder irrigation.  This has persisted and been  troublesome for him.  For that reason, he was taken back to the  operating room for the above procedures.  Potential risks,  complications, and alternative options have been discussed with the  patient and informed consent was obtained.   DESCRIPTION OF PROCEDURE:  The patient was taken to the operating room  and an LMA was placed for general anesthesia.  He was given preoperative  antibiotics while in the hospital and was placed in  the dorsal lithotomy  position.  He was prepped and draped in the usual sterile fashion.  A  preoperative timeout was performed.  Cystourethroscopy was performed  which demonstrated an enlarged prostate with trilobar hypertrophy.  Examination of the bladder demonstrated multiple large old clots within  the bladder.  The resectoscope sheath was then placed within the bladder  and these clots were evacuated out with a Toomey syringe.  Reinspection  of the bladder demonstrated no active bleeding sites.  His prior bladder  biopsy site was fulgurated, although, did not demonstrate active  bleeding.  There was no blood seen from the left ureteral orifice.  He  was noted to have some continued bleeding from the prostate.  It was  decided to perform a small transurethral resection of the prostate to  achieve hemostasis.  Therefore, using loupe electrocautery, and after  the irrigation was switched to glycine, the prostate was resected  including the median lobe and a portion of each lateral lobe until  adequate hemostasis could be achieved.  A new 22 French catheter was  then placed into the bladder and irrigated and appeared  to irrigate very  clear.  At this point, the procedure was ended.  He tolerated the  procedure well without complications.  He was able to be awakended and  he was transferred to the recovery room where he was in satisfactory  condition.      Heloise Purpura, MD  Electronically Signed     LB/MEDQ  D:  03/05/2007  T:  03/06/2007  Job:  [id number]

## 2010-05-31 NOTE — H&P (Signed)
NAME:  Alexander Barnett, Alexander Barnett NO.:  [id number]   MEDICAL RECORD NO.:  [id number]          PATIENT TYPE:  INP   LOCATION:  1426                         FACILITY:  [facility]   PHYSICIAN:  Valetta Fuller, M.D.  DATE OF BIRTH:  Aug 18, 1920   DATE OF ADMISSION:  01/09/2008  DATE OF DISCHARGE:                              HISTORY & PHYSICAL   REASON FOR ADMISSION:  Is clot urinary retention, gross hematuria,  history of adenocarcinoma of the prostate, history of urothelial  carcinoma and anemia.   HISTORY OF PRESENT ILLNESS:  [name] is an 77 year old male.  He had  been a longstanding patient of Dr. Sharrie Rothman and his care as been assumed,  over the last year or so, by Dr. Laverle Patter.  The patient has a history of  adenocarcinoma of the prostate and is status post radiation therapy.  He  also has a history of urothelial carcinoma and has had a  nephroureterectomy as well as bladder tumors.  More recently, the  patient has had several procedures to try to eliminate outlet  obstruction.  He has had intermittent but very problematic problems with  gross hematuria and has had at least half a dozen admissions over the  last year or so primarily related to gross hematuria, urinary retention.  The patient was last operated on about 10 days ago, at which point he  had a large clot his bladder that required operative clot evacuation.  The patient has had persistent hematuria since that discharge and it  really worsened this evening.  He did feel somewhat lightheaded and had  a significant amount of what sounds like overflow leakage from his  bladder along with bleeding per urethra.  The patient presented to  [facility] ER where his hemoglobin was in the 6-7 range.  Hemodynamically he is been stable.  He presents now for admission.  In  the emergency room his Foley catheter was not draining.  That was  removed and a 24-French 3-way catheter was placed.  We obtained huge  amounts of clots  after irrigating with approximately 3 liters of normal  saline.  All clots were evacuated and urine was then light pink with  continuous bladder irrigation.   PAST HISTORY:  Notable for the above-mentioned issues as well as some  hypertension.   CURRENT MEDICATIONS:  1. Ziac.  2. Lisinopril.  3. Eye drops.  4. Celebrex.  5. Finasteride.   ALLERGIES:  No drug allergies.   REVIEW OF SYSTEMS:  Is negative for any abdominal or flank discomfort.  No fever or chills otherwise unremarkable.   PHYSICAL EXAMINATION:  GENERAL:  On exam he is a well-developed, well-  nourished male.  Alert, awake, oriented x3.  VITAL SIGNS:  Blood pressure was 127/52 with pulse of 83 and he is  afebrile.  LUNGS:  Respiratory effort normal.  HEART: Regular rate and rhythm.  ABDOMEN: Soft, nontender with mild bladder distention.  Blood per  urethra.  EXTERNAL GENITALIA: Otherwise unremarkable.   DATA:  Hemoglobin is pending, hematocrit was 19.5, a platelet count  normal at 252,000, creatinine elevated at  2.3.   ASSESSMENT:  Is gross hematuria.  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  He did have recent evaluation by Dr.  Laverle Patter and does not appear to have any recurrent urothelial carcinoma at  this time.  The patient will continue with continuous bladder  irrigation.  He will also be started on Amicar.  He will be observed.  He will be transfused at least 2 units of packed blood cells was repeat  blood work in the morning.      Valetta Fuller, M.D.  Electronically Signed     DSG/MEDQ  D:  01/10/2008  T:  01/10/2008  Job:  [id number]   cc:   Heloise Purpura, MD  Fax: [phone]

## 2010-06-03 NOTE — Op Note (Signed)
NAME:  Alexander Barnett, [name]                          ACCOUNT NO.:  [id number]   MEDICAL RECORD NO.:  [id number]                   PATIENT TYPE:  AMB   LOCATION:  ENDO                                 FACILITY:  [facility]   PHYSICIAN:  Petra Kuba, M.D.                 DATE OF BIRTH:  08/03/1920   DATE OF PROCEDURE:  04/24/2002  DATE OF DISCHARGE:                                 OPERATIVE REPORT   PROCEDURE:  Colonoscopy.   INDICATIONS FOR PROCEDURE:  Right sided abdominal pain in a patient due for  colonic screening.   Consent was signed after risks, benefits, methods, and options were  thoroughly discussed in the office.   MEDICINES USED:  Demerol 50, Versed 6.   DESCRIPTION OF PROCEDURE:  Rectal inspection was pertinent for some external  hemorrhoids. Digital exam was negative. The pediatric video adjustable  colonoscope was inserted, fairly easily advanced around the colon to the  cecum. This did require some abdominal pressure but no position changes. On  insertion, a significant left sided diverticula was seen but no other  abnormalities. The cecum was identified by the appendiceal orifice and the  ileocecal valve. The scope was inserted a short ways into the terminal ileum  which was normal on quick evaluation,  scope was slowly withdrawn. The prep  was adequate. There was some liquid stool that required washing and  suctioning. On slow withdrawal through the colon, other than the significant  left sided diverticula, no polypoid lesions or masses or other abnormalities  were seen as we slowly withdrew back to the rectum. Once back in the rectum,  the scope was retroflexed pertinent for some internal hemorrhoids.  The  scope was straightened and readvanced a short ways up the left side of the  colon, air was suctioned, scope removed. The patient tolerated the procedure  well. There was no obvious or immediate complications.   ENDOSCOPIC DIAGNOSIS:  1. External greater than  internal hemorrhoids.  2. Left significant diverticula.  3. Otherwise within normal limits to the cecum and a quick look at the     terminal ileum.   PLAN:  Repeat screening in five years if doing well medically. Happy to see  back p.r.n., otherwise, return care to doctors Georgina Pillion and Samuella Cota to  reevaluate his symptoms since it does sound like he has a hernia of some  kind, possibly even do a CAT scan next to rule out an internal hernia.                                               Petra Kuba, M.D.    MEM/MEDQ  D:  04/24/2002  T:  04/25/2002  Job:  [id number]   cc:   Claudette Laws, M.D.  [phone]  [place]  [place] [address]  Fax: [phone]   Oley Balm. Georgina Pillion, M.D.  [address]  [place]  [place] [address]  Fax: [phone]   Donnie Coffin. Samuella Cota, M.D.  [address]  [place]  [place] [address]  Fax: [phone]

## 2010-06-03 NOTE — Op Note (Signed)
NAME:  Alexander Barnett, Alexander Barnett                          ACCOUNT NO.:  [id number]   MEDICAL RECORD NO.:  [id number]                   PATIENT TYPE:  AMB   LOCATION:  DSC                                  FACILITY:  [facility]   PHYSICIAN:  Maisie Fus B. Samuella Cota, M.D.               DATE OF BIRTH:  [birth year]   DATE OF PROCEDURE:  05/28/2002  DATE OF DISCHARGE:                                 OPERATIVE REPORT   [facility] [phone].   PREOPERATIVE DIAGNOSIS:  Right inguinal hernia.   POSTOPERATIVE DIAGNOSIS:  Right inguinal hernia.   PROCEDURE:  Right inguinal herniorrhaphy with mesh.   SURGEON:  Maisie Fus B. Samuella Cota, M.D.   ANESTHESIA:  Local (0.25% Marcaine without epinephrine, 1% Xylocaine without  epinephrine, and sodium bicarbonate), with anesthesia monitoring,  anesthesiologist and C.R.N.A.   DESCRIPTION OF PROCEDURE:  The patient was taken to the operating room and  placed on the table in supine position.  The right lower quadrant of the  abdomen was prepped and draped as a sterile field.  The local anesthetic was  used to block the area for the ilioinguinal nerve and the area for the  oblique incision.  The oblique incision had been marked with the skin  marker.  The incision was made through skin and subcutaneous tissue with  subcutaneous bleeders being cauterized with the Bovie or ligated with 3-0  Vicryl.  The external oblique aponeurosis was divided in the line of its  fibers to and through the external ring.  The patient had a fairly thick  abdominal wall with about 4 cm of subcutaneous fat.  The cord structures  were isolated with a Penrose drain and with dissection, it was noted that  the patient had an indirect hernia sac coming off the anterior medial aspect  of the cord structures.  After the hernia sac had been dissected free, it  was opened.  There was a considerable amount of fatty tissue adjacent to the  medial wall.  This fatty tissue was taken off with the cautery and not  removed.  The  excess hernia sac was removed and then the peritoneum was  closed with a running suture of 0 chromic catgut with the two ends tied to  each other.  The patient had some minimal weakness of the inguinal floor,  and the inguinal floor was imbricated with a running suture of 0 chromic  catgut, which also closed the internal ring so that only the tip of the  little finger could be inserted along the cord structures at the internal  ring.  A piece of 3 x 6 inch Atrium mesh was then fashioned to cover the  entire inguinal floor to extend around the strictures superiorly and  laterally.  The mesh was anchored inferiorly with a running suture of 2-0  Novofil with the first suture being placed in the pubic tubercle.  The mesh  was anchored superiorly  and medially with interrupted sutures of 0 Novofil.  A slit was made into the mesh to accommodate the cord at the internal ring,  and then the mesh was sutured to itself with a 3-0 Vicryl suture.  The mesh  seemed to be lying nicely over the inguinal floor.  The cord structures were  returned to their normal anatomic position.  The ilioinguinal nerve was  never seen.  The external oblique aponeurosis was reapproximated with  interrupted sutures of  3-0 Vicryl.  Scarpa's fascia was closed with 3-0 Vicryl and the skin was  closed with a running subcuticular suture of 4-0 Monocryl.  Benzoin and half-  inch Steri-Strips were used to reinforce the skin closure.  Dry sterile  dressing was applied.  The patient seemed to tolerate the procedure well and  was taken to the PACU in satisfactory condition.                                               Thomas B. Samuella Cota, M.D.    TBP/MEDQ  D:  05/28/2002  T:  05/29/2002  Job:  [id number]   cc:   Oley Balm. Georgina Pillion, M.D.  [address]  [place]  [place] [address]  Fax: [phone]

## 2010-06-19 ENCOUNTER — Emergency Department (HOSPITAL_COMMUNITY)
Admission: EM | Admit: 2010-06-19 | Discharge: 2010-06-19 | Disposition: A | Discharge status: Home / Self Care | Payer: Medicare Other | Attending: Emergency Medicine | Admitting: Emergency Medicine

## 2010-06-19 DIAGNOSIS — R319 Hematuria, unspecified: Secondary | ICD-10-CM | POA: Insufficient documentation

## 2010-06-19 DIAGNOSIS — I10 Essential (primary) hypertension: Secondary | ICD-10-CM | POA: Insufficient documentation

## 2010-06-19 DIAGNOSIS — Z8551 Personal history of malignant neoplasm of bladder: Secondary | ICD-10-CM | POA: Insufficient documentation

## 2010-06-19 DIAGNOSIS — Z8546 Personal history of malignant neoplasm of prostate: Secondary | ICD-10-CM | POA: Insufficient documentation

## 2010-06-19 LAB — URINALYSIS, ROUTINE W REFLEX MICROSCOPIC
Bilirubin Urine: NEGATIVE
Glucose, UA: NEGATIVE mg/dL
Protein, ur: 30 mg/dL — AB

## 2010-06-19 LAB — POCT I-STAT, CHEM 8
BUN: 15 mg/dL (ref 6–23)
Calcium, Ion: 1.13 mmol/L (ref 1.12–1.32)
Creatinine, Ser: 1.9 mg/dL — ABNORMAL HIGH (ref 0.4–1.5)
Glucose, Bld: 110 mg/dL — ABNORMAL HIGH (ref 70–99)
Hemoglobin: 12.6 g/dL — ABNORMAL LOW (ref 13.0–17.0)
Sodium: 139 mEq/L (ref 135–145)
TCO2: 20 mmol/L (ref 0–100)

## 2010-06-19 LAB — URINE MICROSCOPIC-ADD ON

## 2010-06-21 ENCOUNTER — Inpatient Hospital Stay (HOSPITAL_COMMUNITY)
Admission: AD | Admit: 2010-06-21 | Discharge: 2010-06-24 | DRG: 670 | Disposition: A | Discharge status: Home / Self Care | Payer: Medicare Other | Source: Ambulatory Visit | Attending: Urology | Admitting: Urology

## 2010-06-21 DIAGNOSIS — R319 Hematuria, unspecified: Principal | ICD-10-CM | POA: Diagnosis present

## 2010-06-21 DIAGNOSIS — C679 Malignant neoplasm of bladder, unspecified: Secondary | ICD-10-CM | POA: Diagnosis present

## 2010-06-21 DIAGNOSIS — N400 Enlarged prostate without lower urinary tract symptoms: Secondary | ICD-10-CM | POA: Diagnosis present

## 2010-06-21 DIAGNOSIS — Z8546 Personal history of malignant neoplasm of prostate: Secondary | ICD-10-CM

## 2010-06-21 DIAGNOSIS — I498 Other specified cardiac arrhythmias: Secondary | ICD-10-CM | POA: Diagnosis present

## 2010-06-21 LAB — CBC
HCT: 31.8 % — ABNORMAL LOW (ref 39.0–52.0)
Hemoglobin: 10.7 g/dL — ABNORMAL LOW (ref 13.0–17.0)
MCHC: 33.6 g/dL (ref 30.0–36.0)
MCV: 88.8 fL (ref 78.0–100.0)
RDW: 16.2 % — ABNORMAL HIGH (ref 11.5–15.5)
WBC: 7.6 10*3/uL (ref 4.0–10.5)

## 2010-06-21 LAB — URINE CULTURE: Culture  Setup Time: 201206040014

## 2010-06-21 LAB — BASIC METABOLIC PANEL
BUN: 24 mg/dL — ABNORMAL HIGH (ref 6–23)
CO2: 22 mEq/L (ref 19–32)
GFR calc non Af Amer: 33 mL/min — ABNORMAL LOW (ref >60)
Glucose, Bld: 87 mg/dL (ref 70–99)
Potassium: 4.2 mEq/L (ref 3.5–5.1)
Sodium: 134 mEq/L — ABNORMAL LOW (ref 135–145)

## 2010-06-22 LAB — BASIC METABOLIC PANEL
CO2: 24 mEq/L (ref 19–32)
Calcium: 8.7 mg/dL (ref 8.4–10.5)
Chloride: 104 mEq/L (ref 96–112)
GFR calc Af Amer: 40 mL/min — ABNORMAL LOW (ref >60)
Glucose, Bld: 106 mg/dL — ABNORMAL HIGH (ref 70–99)
Sodium: 136 mEq/L (ref 135–145)

## 2010-06-24 ENCOUNTER — Emergency Department (HOSPITAL_COMMUNITY)
Admission: EM | Admit: 2010-06-24 | Discharge: 2010-06-24 | Disposition: A | Discharge status: Home / Self Care | Payer: Medicare Other | Attending: Emergency Medicine | Admitting: Emergency Medicine

## 2010-06-24 DIAGNOSIS — I10 Essential (primary) hypertension: Secondary | ICD-10-CM | POA: Insufficient documentation

## 2010-06-24 DIAGNOSIS — Z923 Personal history of irradiation: Secondary | ICD-10-CM | POA: Insufficient documentation

## 2010-06-24 DIAGNOSIS — R339 Retention of urine, unspecified: Secondary | ICD-10-CM | POA: Insufficient documentation

## 2010-06-24 DIAGNOSIS — Z8546 Personal history of malignant neoplasm of prostate: Secondary | ICD-10-CM | POA: Insufficient documentation

## 2010-06-24 DIAGNOSIS — Z8551 Personal history of malignant neoplasm of bladder: Secondary | ICD-10-CM | POA: Insufficient documentation

## 2010-06-24 LAB — URINE MICROSCOPIC-ADD ON

## 2010-06-24 LAB — URINALYSIS, ROUTINE W REFLEX MICROSCOPIC
Bilirubin Urine: NEGATIVE
Glucose, UA: NEGATIVE mg/dL
Specific Gravity, Urine: 1.015 (ref 1.005–1.030)
pH: 6 (ref 5.0–8.0)

## 2010-06-25 NOTE — Op Note (Signed)
NAMEMCKALE, HAFFEY NO.:  [id number]  MEDICAL RECORD NO.:  [id number]  LOCATION:  1405                         FACILITY:  [facility]  PHYSICIAN:  Heloise Purpura, MD      DATE OF BIRTH:  02/02/1920  DATE OF PROCEDURE:  06/23/2010 DATE OF DISCHARGE:                              OPERATIVE REPORT   PREOPERATIVE DIAGNOSES: 1. Hematuria. 2. History of prostate cancer status post radiation therapy. 3. History of urothelial carcinoma.  POSTOPERATIVE DIAGNOSES: 1. Hematuria 2. History of prostate cancer status post radiation therapy. 3. History of urothelial carcinoma.  PROCEDURE: 1. Cystoscopy. 2. Clot evacuation. 3. Fulguration of bleeding sites within bladder.  SURGEON:  Heloise Purpura, MD  ANESTHESIA:  General.  COMPLICATIONS:  None.  ESTIMATED BLOOD LOSS:  Minimal.  INDICATIONS:  Alexander Barnett is an 77 year old gentleman with a history of prostate cancer status post radiation therapy.  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  He has a history of radiation prostatitis and cystitis and has had multiple admissions in the past for persistent urinary tract bleeding.  He was admitted to the hospital 2 days ago, when he developed persistent hematuria and clots requiring catheterization and subsequent continuous bladder irrigation.  His urine has continued to require continuous bladder irrigation and he has not significantly improved over the last 48 hours.  I, therefore, recommended that he proceed to the operating room for cystoscopy and further evaluation in the operating room including possible clot evacuation and fulguration of any potential bleeding sites.  The potential risks, complications, and alternative treatment options were discussed in detail and informed consent obtained.  DESCRIPTION OF PROCEDURE:  The patient was  taken to the operating room and a general anesthetic was administered.  He was given preoperative antibiotics, placed in the dorsal lithotomy position, and prepped and draped in the usual sterile fashion.  Next, preoperative time-out was performed.  Cystourethroscopy was performed, which revealed a normal anterior urethra.  Inspection of the prostatic urethra revealed a very friable prostatic urethra although without evidence of any active ongoing bleeding.  On inspection of the bladder, there was noted to be a large formed clot within the bladder.  Therefore, the cystoscope was removed and replaced with a 28-French resectoscope sheath under direct vision.  A Toomey syringe was then used to gently irrigate the bladder with removal of all clot from the bladder.  Reinspection of the bladder revealed some edematous changes consistent with his indwelling catheter but no obvious bladder tumors.  On further inspection with a 70 degree lens, there was noted to be one area that was oozing some blood right at 12 o'clock near the bladder neck.  Therefore, a transurethral loop was placed through the resectoscope and this area was fulgurated with electrocautery.  There were also friable sites within the prostatic urethra which were subsequently fulgurated with electrocautery.  The resectoscope was then removed and an 18-French 3-way catheter was placed into the bladder and a catheter plug was utilized.  He will be transferred back to his hospital room for observation overnight with plans to restart  continuous bladder irrigation, if his urine becomes red again.  Otherwise, he will undergo a voiding trial in the morning with plans for discharge tomorrow.  He tolerated the procedure well without complications.  He was able to be awakened and transferred to recovery unit in satisfactory condition.     Heloise Purpura, MD     LB/MEDQ  D:  06/23/2010  T:  06/24/2010  Job:  [id number]  Electronically  Signed by Heloise Purpura MD on 06/25/2010 02:11:09 AM

## 2010-07-07 NOTE — Discharge Summary (Addendum)
  Alexander Barnett, Alexander Barnett NO.:  [id number]  MEDICAL RECORD NO.:  [id number]  LOCATION:  WLED                         FACILITY:  [facility]  PHYSICIAN:  Heloise Purpura, MD      DATE OF BIRTH:  1920-06-13  DATE OF ADMISSION:  06/21/2010 DATE OF DISCHARGE:  06/24/2010                              DISCHARGE SUMMARY   ADMISSION DIAGNOSIS:  Gross hematuria with clot urinary retention.  DISCHARGE DIAGNOSIS:  Gross hematuria with clot urinary retention.  SECONDARY DIAGNOSES: 1. History of prostate cancer, status post radiation therapy. 2. 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 3. Chronic kidney disease.  HISTORY AND PHYSICAL:  For full details, please see admission history and physical.  Briefly, Mr. Aigner is an 77 year old gentleman with a long history of intermittent episodes of gross hematuria, felt to be related to radiation cystitis/prostatitis.  He has required multiple hospitalizations and previous blood transfusions for significant bleeding.  He again developed the acute onset of gross hematuria last weekend and required a catheter placement in the emergency department. He followed up earlier this week with complaints of continued hematuria and did have his catheter irrigated with multiple clots removed.  His symptoms persisted and therefore it was felt that he would require admission to the hospital and continuous bladder irrigation.  HOSPITAL COURSE:  A 22-French 3-way coude hematuria catheter was inserted in our office and he was admitted to the hospital for continuous bladder irrigation after multiple clots were removed from his bladder.  He was maintained on continuous bladder irrigation and although his urine remained fairly pink on irrigation, it became significantly red upon stopping continuous bladder  irrigation, which could not be weaned.  Considering his prior history and known problems with persistent radiation cystitis, I recommended that we proceed to the operating room on June 23, 2010, for cystoscopy and clot evacuation in the operating room as well as fulguration of any bleeding sites.  He was agreeable to proceed and was taken to the operating room that afternoon. He did have a large clot within his bladder, which was evacuated.  He also had a bleeding site noted near the bladder neck, which was fulgurated.  A new 18-French catheter was then placed overnight and his urine remained clear the next morning.  His catheter was therefore removed and he was able to pass a voiding trial without difficulty.  DISPOSITION:  Home.  DISCHARGE MEDICATIONS:  He will resume his regular home medications without any changes.  INSTRUCTIONS:  He has been instructed to refrain from any heavy lifting or strenuous activity.  FOLLOWUP:  He will follow up next week if he is having any issues or difficulties.  Otherwise, he will be rescheduled to follow up in approximately 3-4 months for continued cystoscopic surveillance of his bladder cancer.     Heloise Purpura, MD     LB/MEDQ  D:  06/24/2010  T:  06/25/2010  Job:  [id number]  Electronically Signed by Heloise Purpura MD on 07/09/2010 10:26:17 PM

## 2010-07-09 NOTE — Consult Note (Signed)
NAMEJARRYN, ALTLAND NO.:  [id number]  MEDICAL RECORD NO.:  [id number]           PATIENT TYPE:  E  LOCATION:  WLED                         FACILITY:  [facility]  PHYSICIAN:  Danae Chen, M.D.  DATE OF BIRTH:  Nov 04, 1920  DATE OF CONSULTATION:  06/19/2010 DATE OF DISCHARGE:                                CONSULTATION   REASON FOR CONSULTATION:  Gross hematuria and inability to insert Foley catheter.  HISTORY:  The patient is an 77 year old male with history of prostate cancer, treated with external beam radiation and androgen deprivation in May 2008.  He has a history of gross hematuria secondary to radiation cystitis.  He also has a history of urothelial carcinoma of the renal pelvis and the bladder.  He has been having gross hematuria since yesterday and today he has been having difficulty voiding and he has been passing small blood clots but tonight he was unable to urinate and he came to the emergency room and the nurses were unable to insert Foley catheter in the bladder.  According to the patient, last time he had hematuria, Dr. Laverle Patter had to use a guidewire to get the catheter in the bladder.  I attempted to pass a #22 Coude catheter in the bladder but there was obstruction at the bladder neck.  I then passed a flexible cystoscope through the urethra and passed a Glidewire in the bladder.  I then sequentially dilated the urethra with Hayman dilator up to #22- [place] and passed a #22 Councill tip catheter over the guidewire in the bladder.  Some small blood clots were irrigated out of the bladder and then return became tea-colored.  There were no more blood clots in the bladder.  The patient felt better.  He will be discharged home with the Foley catheter, to be followed by Dr. Laverle Patter.  PAST MEDICAL HISTORY:  In his past medical history, there is a history of atrial fibrillation, glaucoma, hypertension, prostate cancer, transitional cell carcinoma of the  bladder, TCC of the left renal pelvis.  PAST SURGICAL HISTORY:  He had cataract surgery, inguinal hernia repair, TURP, laparoscopic left ureteronephrectomy.  ALLERGIES:  No known drug allergies.  FAMILY HISTORY:  Parents are deceased of unknown causes to him.  MEDICATIONS:  Amlodipine, Benicar, dorzolamide, finasteride, Travatan.  SOCIAL HISTORY:  He is a widower.  He quit smoking several years ago.Marland Kitchen He drinks occasionally.  REVIEW OF SYSTEMS:  As noted in the HPI and everything else is negative.  PHYSICAL EXAMINATION:  GENERAL:  This is a well-developed 77 year old male who is complaining of gross hematuria and moderate suprapubic discomfort. He is alert and oriented to time, place and person. VITAL SIGNS:  His blood pressure is 185/84, pulse 64, respirations 18, temperature 98.2. SKIN:  Warm and dry. ABDOMEN:  Soft.  His bladder is not distended.  He has no CVA tenderness.  Kidneys are not palpable.  He has no hepatomegaly, no splenomegaly.  He has no inguinal adenopathy and he has no inguinal hernia. GU:  Penis is circumcised.  Meatus is normal.  Scrotum is normal.  He has no testicular mass.  Cords  and epididymis are within normal limits.  LABORATORY DATA:  His hemoglobin is 12.6, hematocrit 37, BUN 15, creatinine 1.9.  IMPRESSION:  Gross hematuria, history of prostate cancer, history of bladder cancer, history of TCC of left renal pelvis.  As mentioned in history of present illness, I inserted a #22-French Councill tip catheter in the bladder after dilation with [facility] dilator. The catheter would be left to straight drainage.  I also put him on Cipro 500 mg daily.  He will be followed by Dr. Laverle Patter.     Danae Chen, M.D.     MN/MEDQ  D:  06/19/2010  T:  06/19/2010  Job:  [id number]  Electronically Signed by Lindaann Slough M.D. on 07/09/2010 01:39:30 PM

## 2010-10-07 LAB — BASIC METABOLIC PANEL
Calcium: 9.1
GFR calc Af Amer: >60
GFR calc non Af Amer: >60
Glucose, Bld: 140 — ABNORMAL HIGH
Potassium: 4.2
Sodium: 140

## 2010-10-07 LAB — PROTIME-INR
INR: 0.9
Prothrombin Time: 12.3

## 2010-10-07 LAB — HEMOGLOBIN AND HEMATOCRIT, BLOOD: Hemoglobin: 13.5

## 2010-10-10 LAB — BASIC METABOLIC PANEL
BUN: 18
BUN: 8
CO2: 27
CO2: 27
Calcium: 8 — ABNORMAL LOW
Chloride: 105
Chloride: 105
Chloride: 109
Creatinine, Ser: 0.98
GFR calc Af Amer: 50 — ABNORMAL LOW
GFR calc Af Amer: 55 — ABNORMAL LOW
GFR calc Af Amer: >60
GFR calc non Af Amer: 41 — ABNORMAL LOW
GFR calc non Af Amer: 42 — ABNORMAL LOW
GFR calc non Af Amer: 45 — ABNORMAL LOW
Potassium: 3.4 — ABNORMAL LOW
Potassium: 3.7
Potassium: 4
Potassium: 4.1
Sodium: 133 — ABNORMAL LOW
Sodium: 134 — ABNORMAL LOW
Sodium: 137

## 2010-10-10 LAB — HEMOGLOBIN AND HEMATOCRIT, BLOOD
HCT: 32.4 — ABNORMAL LOW
HCT: 30.7 — ABNORMAL LOW
HCT: 32 — ABNORMAL LOW
Hemoglobin: 10.3 — ABNORMAL LOW
Hemoglobin: 10.5 — ABNORMAL LOW

## 2010-10-10 LAB — CBC
HCT: 31.1 — ABNORMAL LOW
MCHC: 33.3
MCV: 86.1
Platelets: 173
RBC: 3.61 — ABNORMAL LOW
WBC: 7.1

## 2010-10-10 LAB — ABO/RH: ABO/RH(D): O POS

## 2010-10-10 LAB — CREATININE, FLUID (PLEURAL, PERITONEAL, JP DRAINAGE): Creat, Fluid: 1.6

## 2010-10-13 LAB — POCT I-STAT 4, (NA,K, GLUC, HGB,HCT)
Potassium: 3.9
Sodium: 141

## 2010-10-13 LAB — BASIC METABOLIC PANEL
BUN: 24 — ABNORMAL HIGH
CO2: 25
Calcium: 8.8
Creatinine, Ser: 1.93 — ABNORMAL HIGH
GFR calc non Af Amer: 33 — ABNORMAL LOW
Glucose, Bld: 110 — ABNORMAL HIGH

## 2010-10-13 LAB — CBC
MCHC: 33.7
Platelets: 175
RDW: 19.3 — ABNORMAL HIGH

## 2010-10-17 LAB — BASIC METABOLIC PANEL
BUN: 22
CO2: 23
Chloride: 112
Creatinine, Ser: 1.47
Potassium: 4.3

## 2010-10-17 LAB — POCT HEMOGLOBIN-HEMACUE: Hemoglobin: 13

## 2010-10-18 LAB — URINALYSIS, ROUTINE W REFLEX MICROSCOPIC
Bilirubin Urine: NEGATIVE
Glucose, UA: NEGATIVE
Nitrite: NEGATIVE
Specific Gravity, Urine: 1.011
pH: 6.5

## 2010-10-18 LAB — URINE CULTURE

## 2010-10-18 LAB — URINE MICROSCOPIC-ADD ON

## 2010-10-19 LAB — BASIC METABOLIC PANEL
CO2: 23
Chloride: 107
GFR calc Af Amer: 50 — ABNORMAL LOW
Potassium: 4.9
Sodium: 138

## 2010-10-19 LAB — HEMOGLOBIN AND HEMATOCRIT, BLOOD
HCT: 35.5 — ABNORMAL LOW
Hemoglobin: 11.7 — ABNORMAL LOW

## 2010-10-19 LAB — URINE CULTURE
Colony Count: NO GROWTH
Culture: NO GROWTH

## 2010-10-19 LAB — URINALYSIS, ROUTINE W REFLEX MICROSCOPIC
Bilirubin Urine: NEGATIVE
Glucose, UA: NEGATIVE
Ketones, ur: NEGATIVE
Nitrite: NEGATIVE
Protein, ur: 100 — AB
Specific Gravity, Urine: 1.014
Urobilinogen, UA: 0.2
pH: 6

## 2010-10-19 LAB — URINE MICROSCOPIC-ADD ON

## 2010-10-21 LAB — CBC
HCT: 23 % — ABNORMAL LOW (ref 39.0–52.0)
HCT: 30 % — ABNORMAL LOW (ref 39.0–52.0)
Hemoglobin: 10.1 g/dL — ABNORMAL LOW (ref 13.0–17.0)
Hemoglobin: 6.7 g/dL — CL (ref 13.0–17.0)
Hemoglobin: 7.3 g/dL — CL (ref 13.0–17.0)
MCHC: 31.7 g/dL (ref 30.0–36.0)
MCHC: 33.7 g/dL (ref 30.0–36.0)
MCV: 88.6 fL (ref 78.0–100.0)
MCV: 91 fL (ref 78.0–100.0)
Platelets: 177 10*3/uL (ref 150–400)
Platelets: 180 10*3/uL (ref 150–400)
RBC: 2.19 MIL/uL — ABNORMAL LOW (ref 4.22–5.81)
RBC: 2.59 MIL/uL — ABNORMAL LOW (ref 4.22–5.81)
RBC: 3.29 MIL/uL — ABNORMAL LOW (ref 4.22–5.81)
RDW: 14.5 % (ref 11.5–15.5)
RDW: 15.8 % — ABNORMAL HIGH (ref 11.5–15.5)
WBC: 7.6 10*3/uL (ref 4.0–10.5)
WBC: 7.7 10*3/uL (ref 4.0–10.5)

## 2010-10-21 LAB — CROSSMATCH

## 2010-10-21 LAB — BASIC METABOLIC PANEL
BUN: 21 mg/dL (ref 6–23)
CO2: 22 mEq/L (ref 19–32)
CO2: 23 mEq/L (ref 19–32)
CO2: 23 mEq/L (ref 19–32)
Calcium: 7.8 mg/dL — ABNORMAL LOW (ref 8.4–10.5)
Calcium: 9 mg/dL (ref 8.4–10.5)
Chloride: 104 mEq/L (ref 96–112)
Chloride: 114 mEq/L — ABNORMAL HIGH (ref 96–112)
Creatinine, Ser: 1.81 mg/dL — ABNORMAL HIGH (ref 0.4–1.5)
GFR calc Af Amer: 43 mL/min — ABNORMAL LOW (ref >60)
GFR calc Af Amer: 47 mL/min — ABNORMAL LOW (ref >60)
GFR calc Af Amer: 54 mL/min — ABNORMAL LOW (ref >60)
GFR calc non Af Amer: 36 mL/min — ABNORMAL LOW (ref >60)
Glucose, Bld: 115 mg/dL — ABNORMAL HIGH (ref 70–99)
Glucose, Bld: 146 mg/dL — ABNORMAL HIGH (ref 70–99)
Potassium: 3.9 mEq/L (ref 3.5–5.1)
Potassium: 4.4 mEq/L (ref 3.5–5.1)
Sodium: 132 mEq/L — ABNORMAL LOW (ref 135–145)
Sodium: 140 mEq/L (ref 135–145)
Sodium: 140 mEq/L (ref 135–145)

## 2010-10-21 LAB — PREPARE RBC (CROSSMATCH)

## 2010-10-21 LAB — POCT I-STAT, CHEM 8
BUN: 29 mg/dL — ABNORMAL HIGH (ref 6–23)
Calcium, Ion: 1.1 mmol/L — ABNORMAL LOW (ref 1.12–1.32)
Chloride: 101 mEq/L (ref 96–112)
Creatinine, Ser: 2.3 mg/dL — ABNORMAL HIGH (ref 0.4–1.5)
Glucose, Bld: 115 mg/dL — ABNORMAL HIGH (ref 70–99)

## 2010-10-21 LAB — URINALYSIS, ROUTINE W REFLEX MICROSCOPIC
Ketones, ur: 40 mg/dL — AB
Nitrite: POSITIVE — AB
Protein, ur: >300 mg/dL — AB
pH: 5 (ref 5.0–8.0)

## 2010-10-21 LAB — DIFFERENTIAL
Lymphocytes Relative: 12 % (ref 12–46)
Monocytes Absolute: 1.2 10*3/uL — ABNORMAL HIGH (ref 0.1–1.0)
Monocytes Relative: 11 % (ref 3–12)
Neutro Abs: 8.4 10*3/uL — ABNORMAL HIGH (ref 1.7–7.7)

## 2010-10-21 LAB — URINE MICROSCOPIC-ADD ON

## 2010-10-21 LAB — URINE CULTURE: Culture: NO GROWTH

## 2010-10-21 LAB — HEMOGLOBIN AND HEMATOCRIT, BLOOD
HCT: 26.4 % — ABNORMAL LOW (ref 39.0–52.0)
HCT: 31.7 % — ABNORMAL LOW (ref 39.0–52.0)

## 2010-11-02 LAB — LIPID PANEL
HDL: 61
Triglycerides: 46
VLDL: 9

## 2010-11-02 LAB — CBC
HCT: 35.3 — ABNORMAL LOW
HCT: 37.8 — ABNORMAL LOW
Hemoglobin: 11.9 — ABNORMAL LOW
Hemoglobin: 12.8 — ABNORMAL LOW
Hemoglobin: 12.9 — ABNORMAL LOW
MCHC: 33.7
MCHC: 33.8
MCV: 87.7
MCV: 88.7
Platelets: 128 — ABNORMAL LOW
RBC: 3.99 — ABNORMAL LOW
RDW: 14.8 — ABNORMAL HIGH
RDW: 15.1 — ABNORMAL HIGH
WBC: 5.8

## 2010-11-02 LAB — PROTIME-INR
INR: 1
Prothrombin Time: 13.1
Prothrombin Time: 13.8

## 2010-11-02 LAB — BASIC METABOLIC PANEL
BUN: 22
CO2: 25
Calcium: 8.6
Chloride: 108
GFR calc non Af Amer: >60
GFR calc non Af Amer: >60
Glucose, Bld: 112 — ABNORMAL HIGH
Glucose, Bld: 158 — ABNORMAL HIGH
Potassium: 3.4 — ABNORMAL LOW
Sodium: 138
Sodium: 139

## 2010-11-02 LAB — CK TOTAL AND CKMB (NOT AT ARMC)
CK, MB: 3.7
Relative Index: 2.5
Total CK: 156

## 2010-11-02 LAB — TROPONIN I: Troponin I: 0.02

## 2010-11-16 ENCOUNTER — Other Ambulatory Visit: Payer: Self-pay | Admitting: Dermatology

## 2011-05-17 ENCOUNTER — Other Ambulatory Visit: Payer: Self-pay | Admitting: Dermatology

## 2011-12-19 ENCOUNTER — Other Ambulatory Visit: Payer: Self-pay | Admitting: Ophthalmology

## 2012-06-14 ENCOUNTER — Other Ambulatory Visit: Payer: Self-pay | Admitting: Urology

## 2012-06-17 ENCOUNTER — Encounter (HOSPITAL_COMMUNITY): Payer: Self-pay | Admitting: Pharmacy Technician

## 2012-06-19 ENCOUNTER — Other Ambulatory Visit (HOSPITAL_COMMUNITY): Payer: Self-pay | Admitting: *Deleted

## 2012-06-20 ENCOUNTER — Encounter (HOSPITAL_COMMUNITY)
Admission: RE | Admit: 2012-06-20 | Discharge: 2012-06-20 | Disposition: A | Discharge status: Home / Self Care | Payer: Medicare Other | Source: Ambulatory Visit | Attending: Urology | Admitting: Urology

## 2012-06-20 ENCOUNTER — Ambulatory Visit (HOSPITAL_COMMUNITY)
Admission: RE | Admit: 2012-06-20 | Discharge: 2012-06-20 | Disposition: A | Discharge status: Home / Self Care | Payer: Medicare Other | Source: Ambulatory Visit | Attending: Urology | Admitting: Urology

## 2012-06-20 ENCOUNTER — Encounter (HOSPITAL_COMMUNITY): Payer: Self-pay

## 2012-06-20 DIAGNOSIS — J449 Chronic obstructive pulmonary disease, unspecified: Secondary | ICD-10-CM | POA: Insufficient documentation

## 2012-06-20 DIAGNOSIS — I10 Essential (primary) hypertension: Secondary | ICD-10-CM | POA: Insufficient documentation

## 2012-06-20 DIAGNOSIS — Z01818 Encounter for other preprocedural examination: Secondary | ICD-10-CM | POA: Insufficient documentation

## 2012-06-20 DIAGNOSIS — J4489 Other specified chronic obstructive pulmonary disease: Secondary | ICD-10-CM | POA: Insufficient documentation

## 2012-06-20 DIAGNOSIS — Z01812 Encounter for preprocedural laboratory examination: Secondary | ICD-10-CM | POA: Insufficient documentation

## 2012-06-20 DIAGNOSIS — Z01810 Encounter for preprocedural cardiovascular examination: Secondary | ICD-10-CM | POA: Insufficient documentation

## 2012-06-20 HISTORY — DX: Unspecified osteoarthritis, unspecified site: M19.90

## 2012-06-20 HISTORY — DX: Essential (primary) hypertension: I10

## 2012-06-20 LAB — SURGICAL PCR SCREEN
MRSA, PCR: NEGATIVE
Staphylococcus aureus: NEGATIVE

## 2012-06-20 LAB — BASIC METABOLIC PANEL
CO2: 25 mEq/L (ref 19–32)
Calcium: 9.1 mg/dL (ref 8.4–10.5)
Creatinine, Ser: 1.52 mg/dL — ABNORMAL HIGH (ref 0.50–1.35)
GFR calc non Af Amer: 39 mL/min — ABNORMAL LOW (ref >90)
Sodium: 137 mEq/L (ref 135–145)

## 2012-06-20 NOTE — Patient Instructions (Signed)
20      Your procedure is scheduled on:  Monday 06/24/2012  Report to [facility] at 1245 PM..  Call this number if you have problems the morning of surgery: [phone]   Remember:             IF YOU USE CPAP,BRING MASK AND TUBING AM OF SURGERY!   Do not eat food  AFTER MIDNIGHT! MAY HAVE CLEAR LIQUIDS FROM MIDNIGHT UP UNTIL 0915 AM THEN NOTHING UNTIL AFTER SURGERY!  Take these medicines the morning of surgery with A SIP OF WATER: Amlodipine,Bisoprolol   Do not bring valuables to the hospital.  .  Leave suitcase in the car. After surgery it may be brought to your room.  For patients admitted to the hospital, checkout time is 11:00 AM the day of              Discharge.    DO NOT WEAR JEWELRY , MAKE-UP, LOTIONS,POWDERS,PERFUMES!             WOMEN -DO NOT SHAVE LEGS OR UNDERARMS 12 HRS. BEFORE SURGERY!               MEN MAY SHAVE AS USUAL!             CONTACTS,DENTURES OR BRIDGEWORK, FALSE EYELASHES MAY NOT BE WORN INTO SURGERY!                                           Patients discharged the day of surgery will not be allowed to drive home. If going home the same day of surgery, must have someone stay with you  first 24 hrs.at home and arrange for someone to drive you home from the Hospital.                         YOUR DRIVER OZ:[id number]   Special Instructions:             Please read over the following fact sheets that you were given:             1. [facility] PREPARING FOR SURGERY SHEET              2.[facility] INFORMATION              3.[facility]                                        Telford Nab.Ruben Pyka,RN,BSN     [phone]                FAILURE TO FOLLOW THESE INSTRUCTIONS MAY RESULT IN  CANCELLATION OF YOUR SURGERY!               Patient Signature:___________________________

## 2012-06-20 NOTE — Progress Notes (Signed)
Advanced Directive copy on chart and H&H results from 06/13/2012 from [facility] on chart.

## 2012-06-21 NOTE — H&P (Signed)
History of Present Illness  Alexander Barnett is an 79 year old with the following urologic history:  1) Prostate cancer: He is s/p treatment with EBRT (78Gy)/androgen deprivation (6 months) completed in May 2008. He has had no evidence for recurrence thus far. His TURP specimen from July 2009 did not demonstrate recurrent cancer. A repeat TURP in November 2009 also did not demonstrate malignancy.  TNM Stage: cT1c Nx Mx     Gleason Score: 3+4=7     Pretreatment PSA: 15.09 PSA nadir after treatment: <0.04 (Feb 2010)   2) BPH/Urinary retention: He has undergone procedures including TUMT and TUNA in the past.  He also has been on Avodart in the past which he is no longer taking due to breast pain. He has had gross hematuria which may be related to his BPH. He developed urinary retention after his nephroureterectomy and underwent a urodynamic evaluation that was consistent with probable outlet obstruction. He underwent a TURP in July 2009. He did have some incontinence after his TURP although this resolved after stopping his alpha blocker. He did again develop urinary retention which appeared related to clot retention in October 2009. He was taken to the OR in November 2009 and had non-healing prostate tissue which appeared to be the most likely cause of his symptoms and hematuria and underwent resection of residual prostate tissue with a TURP. He has had intermittently problems with urinary retention which have appeared to be related to clot retention and hematuria from a prostatic source. He also has mixed urge and stress incontinence symptoms since his last TURP. 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 He was found to have a low grade, Ta recurrence in June 2009 in the bladder. He has  subsequently required further treatment for bladder recurences.  March 2009: Left L/S nephroureterectomy for pT3 Nx Mx (invasion of renal parenchyma), low grade urothelial carcinoma of the left renal pelvis June 2009: TURBT/[facility], Ta/low grade September 2009: TURBT/[facility], Ta/low grade July 2010: TURBT, Ta/low grade Last upper tract imaging: CT June 2011 Last cysto: Negative (Oct 2013)  4) Chronic kidney disease: Baseline Cr is 1.6.  5) Gross hematuria: He has had hematuria which has required hospitalization and transfusion in the past. This appears to be related to his history of radiation therapy. 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 He required placement of a right nephrostomy tube in January 2010 for management and was able to have his urethral catheter removed. He was treated with hyperbaric oxygen therapy in February 2010. He did develop recurrent gross hematuria and clot retention in November 2010 but was found to have an isolated bleeding vessel which was able to be fulgurated. In June 2012, he again presented with clot retention and bleeding and required hospital admission, clot evacuation, and fulguration of bleeding site at bladder neck.  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 These areas were brush biopsed and were negative for urothelial carcinoma.   Interval history:  [name] was seen today sooner than his expected appointment which was scheduled for this Friday. He developed gross hematuria yesterday  afternoon with blood clots. He denies fever or inability to void.   Past Medical History Problems  1. History of  Arthritis V13.4 2. History of  Atrial Fibrillation 427.31 3. History of  Benign  Prostatic Hyperplasia 600.20 4. History of  Bladder Irrigation 5. History of  Glaucoma 365.9 6. History of  Hypertension 401.9 7. Prostate Cancer 185 8. 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 History of  Cataract Surgery 2. History of  Cystoscopy With Biopsy 3. History of  Cystoscopy With Fulguration 4. History of  Cystoscopy With Fulguration Medium Lesion (2-5cm) 5. History of  Cystoscopy With Fulguration Small Lesion (5-72mm) 6. History of  Cystoscopy With Fulguration Small Lesion (5-4mm) 7. History of  Cystoscopy With Insertion Of Ureteral Stent Right 8. History of  Cystoscopy With Insertion Of Ureteral Stent Right 9. History of  Cystoscopy With Ureteroscopy Left 10. History of  Cystoscopy With Ureteroscopy Right 11. History of  Cystoscopy With Ureteroscopy Right 12. History of  Cystourethroscopy With Irrigation And Evacuation Of Clots 13. History of  Cystourethroscopy With Irrigation And Evacuation Of Clots 14. History of  Cystourethroscopy With Irrigation And Evacuation Of Clots 15. History of  Inguinal Hernia Repair 16. History of  Kidney Surgery Laparoscopically Assisted Nephroureterectomy 17. History of  Laser Vaporization With Transurethral Resection Of Prostate 18. History of  Transuret Resect Of Resid Obstr Tissue After 90 Days Postop 19. History of  Transurethral Resection Of Prostate (TURP)  Current Meds 1. AmLODIPine Besylate 5 MG Oral Tablet; Therapy: (Recorded:07Mar2012) to 2. Benicar 40 MG Oral Tablet; Therapy: 29Nov2011 to 3. Bisoprolol Fumarate 5 MG Oral Tablet; Therapy: (Recorded:05Jun2012) to 4. Dorzolamide HCl 2 % Ophthalmic Solution; Therapy: (Recorded:05Jun2012) to 5. Multivitamin/Iron TABS; Therapy: (Recorded:16Nov2007) to 6. Travatan 0.004 % SOLN; Therapy: (Recorded:16Nov2007) to 7. VESIcare 10 MG Oral Tablet; TAKE 1 TABLET Bedtime  Requested for:  10May2013; Last  Rx:10May2013 8. Vitamin B12 TABS; Therapy: (Recorded:16Nov2007) to  Allergies Medication  1. No Known Drug Allergies  Review of Systems  Genitourinary: hematuria.  Constitutional: no fever and no recent weight loss.  Cardiovascular: no chest pain and no leg swelling.  Respiratory: no shortness of breath.    Vitals Vital Signs [Data Includes: Last 1 Day]  27May2014 08:37AM  Blood Pressure: 172 / 88 Heart Rate: 80  Physical Exam Constitutional: Well nourished and well developed . No acute distress.  ENT:. The ears and nose are normal in appearance.  Neck: The appearance of the neck is normal and no neck mass is present.  Pulmonary: No respiratory distress and normal respiratory rhythm and effort.  Cardiovascular: Heart rate and rhythm are normal . No peripheral edema.  Abdomen: The abdomen is soft and nontender. No masses are palpated. No CVA tenderness. No hernias are palpable. No hepatosplenomegaly noted.  Genitourinary: Examination of the penis demonstrates no lesions and a normal meatus.  Neuro/Psych:. Mood and affect are appropriate.    Procedure     Multiple attempts were made to place a 24 [place] hematuria catheter and were unsuccessful. Considering his complex history, I decided to perform flexible cystourethroscopy. He was prepped and draped in the usual sterile fashion and urethroscopy revealed some blood within the urethra without any other abnormalities. Examination of the prostatic urethra revealed papillary tumor growth toward the left posterior aspect of the prostatic urethra extending toward the bladder neck. Visualization of the bladder was somewhat obscured by bleeding although no definite  tumors were seen within the bladder. A 0.38 Sensor guidewire was advanced through the cystoscope into the bladder. An attempt was made to place a 3 way hematuria catheter which was 7 [place] into the bladder. This was unsuccessful and an 34 [place] council-tip  catheter was passed easily over the wire into the bladder. The bladder was irrigated and the urine was slightly pink but draining well.     Discussion/Summary     1. Gross hematuria: He appears to have recurrent tumor growth at the prostatic urethra/bladder neck. This appears to be the source of his hematuria as well. I recommended cystoscopy and transurethral resection of his prostatic urethral tumor. We have reviewed the potential risks, complications, and alternative options associated with this procedure as well as the expected recovery process. He gives his informed consent to proceed and this will be scheduled for the near future. He will notify me should he develop clot retention of his catheter in the meantime.  2. Prostate cancer: His PSA remains quite low and likely the tumor growth in his prostatic urethra is not adenocarcinoma of the prostate and would be more consistent with urothelial cancer.  3. LUTS/incontinence: He will continue current therapy with finasteride and VESIcare. He understands his upcoming procedure may adversely affect his continence and he accepts this risk.  CC: Dr. Mila Palmer      Signatures Electronically signed by : Heloise Purpura, M.D.; Jun 11 2012 10:42AM

## 2012-06-23 MED ORDER — PIPERACILLIN-TAZOBACTAM 3.375 G IVPB 30 MIN
3.3750 g | Freq: Once | INTRAVENOUS | Status: AC
  Administered 2012-06-24: 3.375 g via INTRAVENOUS
  Filled 2012-06-23 (×2): qty 50

## 2012-06-24 ENCOUNTER — Observation Stay (HOSPITAL_COMMUNITY)
Admission: RE | Admit: 2012-06-24 | Discharge: 2012-06-25 | Disposition: A | Discharge status: Home / Self Care | Payer: Medicare Other | Source: Ambulatory Visit | Attending: Urology | Admitting: Urology

## 2012-06-24 ENCOUNTER — Encounter (HOSPITAL_COMMUNITY)
Admission: RE | Disposition: A | Discharge status: Home / Self Care | Payer: Self-pay | Source: Ambulatory Visit | Attending: Urology

## 2012-06-24 ENCOUNTER — Encounter (HOSPITAL_COMMUNITY): Payer: Self-pay | Admitting: *Deleted

## 2012-06-24 ENCOUNTER — Ambulatory Visit (HOSPITAL_COMMUNITY): Payer: Medicare Other | Admitting: Anesthesiology

## 2012-06-24 ENCOUNTER — Encounter (HOSPITAL_COMMUNITY): Payer: Self-pay | Admitting: Anesthesiology

## 2012-06-24 DIAGNOSIS — C689 Malignant neoplasm of urinary organ, unspecified: Secondary | ICD-10-CM

## 2012-06-24 DIAGNOSIS — Z79899 Other long term (current) drug therapy: Secondary | ICD-10-CM | POA: Insufficient documentation

## 2012-06-24 DIAGNOSIS — R310 Gross hematuria: Secondary | ICD-10-CM | POA: Insufficient documentation

## 2012-06-24 DIAGNOSIS — Z8551 Personal history of malignant neoplasm of bladder: Secondary | ICD-10-CM | POA: Insufficient documentation

## 2012-06-24 DIAGNOSIS — Z8553 Personal history of malignant neoplasm of renal pelvis: Secondary | ICD-10-CM | POA: Insufficient documentation

## 2012-06-24 DIAGNOSIS — N189 Chronic kidney disease, unspecified: Secondary | ICD-10-CM | POA: Insufficient documentation

## 2012-06-24 DIAGNOSIS — C680 Malignant neoplasm of urethra: Principal | ICD-10-CM | POA: Insufficient documentation

## 2012-06-24 DIAGNOSIS — N401 Enlarged prostate with lower urinary tract symptoms: Secondary | ICD-10-CM | POA: Insufficient documentation

## 2012-06-24 DIAGNOSIS — Z923 Personal history of irradiation: Secondary | ICD-10-CM | POA: Insufficient documentation

## 2012-06-24 DIAGNOSIS — I129 Hypertensive chronic kidney disease with stage 1 through stage 4 chronic kidney disease, or unspecified chronic kidney disease: Secondary | ICD-10-CM | POA: Insufficient documentation

## 2012-06-24 DIAGNOSIS — Z906 Acquired absence of other parts of urinary tract: Secondary | ICD-10-CM | POA: Insufficient documentation

## 2012-06-24 DIAGNOSIS — R339 Retention of urine, unspecified: Secondary | ICD-10-CM | POA: Insufficient documentation

## 2012-06-24 DIAGNOSIS — N138 Other obstructive and reflux uropathy: Secondary | ICD-10-CM | POA: Insufficient documentation

## 2012-06-24 DIAGNOSIS — C61 Malignant neoplasm of prostate: Secondary | ICD-10-CM | POA: Insufficient documentation

## 2012-06-24 DIAGNOSIS — I4891 Unspecified atrial fibrillation: Secondary | ICD-10-CM | POA: Insufficient documentation

## 2012-06-24 DIAGNOSIS — Z905 Acquired absence of kidney: Secondary | ICD-10-CM | POA: Insufficient documentation

## 2012-06-24 HISTORY — PX: CYSTOSCOPY: SHX5120

## 2012-06-24 HISTORY — PX: TRANSURETHRAL RESECTION OF PROSTATE: SHX73

## 2012-06-24 SURGERY — TURP (TRANSURETHRAL RESECTION OF PROSTATE)
Anesthesia: General | Wound class: Clean Contaminated

## 2012-06-24 MED ORDER — FINASTERIDE 5 MG PO TABS
5.0000 mg | ORAL_TABLET | Freq: Every day | ORAL | Status: DC
  Filled 2012-06-24 (×2): qty 1

## 2012-06-24 MED ORDER — SODIUM CHLORIDE 0.45 % IV SOLN
INTRAVENOUS | Status: DC
  Administered 2012-06-24: 18:00:00 via INTRAVENOUS

## 2012-06-24 MED ORDER — CIPROFLOXACIN HCL 500 MG PO TABS: 500.0000 mg | ORAL_TABLET | Freq: Two times a day (BID) | ORAL | Status: DC

## 2012-06-24 MED ORDER — GLYCINE 1.5 % IR SOLN
Status: DC | PRN
  Administered 2012-06-24: 3000 mL

## 2012-06-24 MED ORDER — SODIUM CHLORIDE 0.9 % IV SOLN: 250.0000 mL | INTRAVENOUS | Status: DC | PRN

## 2012-06-24 MED ORDER — SODIUM CHLORIDE 0.9 % IJ SOLN: 3.0000 mL | Freq: Two times a day (BID) | INTRAMUSCULAR | Status: DC

## 2012-06-24 MED ORDER — DOCUSATE SODIUM 100 MG PO CAPS
100.0000 mg | ORAL_CAPSULE | Freq: Every day | ORAL | Status: DC | PRN
  Filled 2012-06-24: qty 1

## 2012-06-24 MED ORDER — AMLODIPINE BESYLATE 5 MG PO TABS
5.0000 mg | ORAL_TABLET | Freq: Every morning | ORAL | Status: DC
  Filled 2012-06-24: qty 1

## 2012-06-24 MED ORDER — FENTANYL CITRATE 0.05 MG/ML IJ SOLN
INTRAMUSCULAR | Status: DC | PRN
  Administered 2012-06-24 (×2): 50 ug via INTRAVENOUS

## 2012-06-24 MED ORDER — DIPHENHYDRAMINE HCL 50 MG/ML IJ SOLN: 12.5000 mg | Freq: Four times a day (QID) | INTRAMUSCULAR | Status: DC | PRN

## 2012-06-24 MED ORDER — SODIUM CHLORIDE 0.9 % IJ SOLN: 3.0000 mL | INTRAMUSCULAR | Status: DC | PRN

## 2012-06-24 MED ORDER — HYDROCODONE-ACETAMINOPHEN 5-325 MG PO TABS
1.0000 | ORAL_TABLET | ORAL | Status: DC | PRN
  Administered 2012-06-24: 2 via ORAL
  Filled 2012-06-24: qty 2

## 2012-06-24 MED ORDER — MEPERIDINE HCL 50 MG/ML IJ SOLN: 6.2500 mg | INTRAMUSCULAR | Status: DC | PRN

## 2012-06-24 MED ORDER — OXYCODONE HCL 5 MG/5ML PO SOLN: 5.0000 mg | Freq: Once | ORAL | Status: DC | PRN

## 2012-06-24 MED ORDER — PROPOFOL 10 MG/ML IV BOLUS
INTRAVENOUS | Status: DC | PRN
  Administered 2012-06-24: 150 mg via INTRAVENOUS
  Administered 2012-06-24: 50 mg via INTRAVENOUS

## 2012-06-24 MED ORDER — HYDROCODONE-ACETAMINOPHEN 5-325 MG PO TABS: 1.0000 | ORAL_TABLET | Freq: Four times a day (QID) | ORAL | Status: DC | PRN

## 2012-06-24 MED ORDER — IRBESARTAN 300 MG PO TABS
300.0000 mg | ORAL_TABLET | Freq: Every day | ORAL | Status: DC
  Filled 2012-06-24 (×2): qty 1

## 2012-06-24 MED ORDER — DOCUSATE SODIUM 100 MG PO CAPS
100.0000 mg | ORAL_CAPSULE | Freq: Two times a day (BID) | ORAL | Status: DC
  Administered 2012-06-24: 100 mg via ORAL
  Filled 2012-06-24 (×3): qty 1

## 2012-06-24 MED ORDER — OXYCODONE HCL 5 MG PO TABS: 5.0000 mg | ORAL_TABLET | Freq: Once | ORAL | Status: DC | PRN

## 2012-06-24 MED ORDER — PIPERACILLIN-TAZOBACTAM 3.375 G IVPB
3.3750 g | Freq: Three times a day (TID) | INTRAVENOUS | Status: DC
  Administered 2012-06-24 – 2012-06-25 (×2): 3.375 g via INTRAVENOUS
  Filled 2012-06-24 (×3): qty 50

## 2012-06-24 MED ORDER — ONDANSETRON HCL 4 MG/2ML IJ SOLN
INTRAMUSCULAR | Status: DC | PRN
  Administered 2012-06-24: 4 mg via INTRAVENOUS

## 2012-06-24 MED ORDER — ZOLPIDEM TARTRATE 5 MG PO TABS: 5.0000 mg | ORAL_TABLET | Freq: Every evening | ORAL | Status: DC | PRN

## 2012-06-24 MED ORDER — DIPHENHYDRAMINE HCL 12.5 MG/5ML PO ELIX: 12.5000 mg | ORAL_SOLUTION | Freq: Four times a day (QID) | ORAL | Status: DC | PRN

## 2012-06-24 MED ORDER — CIPROFLOXACIN IN D5W 400 MG/200ML IV SOLN
INTRAVENOUS | Status: AC
  Filled 2012-06-24: qty 200

## 2012-06-24 MED ORDER — CIPROFLOXACIN IN D5W 400 MG/200ML IV SOLN
400.0000 mg | INTRAVENOUS | Status: AC
  Administered 2012-06-24: 400 mg via INTRAVENOUS

## 2012-06-24 MED ORDER — EPHEDRINE SULFATE 50 MG/ML IJ SOLN
INTRAMUSCULAR | Status: DC | PRN
  Administered 2012-06-24: 5 mg via INTRAVENOUS

## 2012-06-24 MED ORDER — LACTATED RINGERS IV SOLN
INTRAVENOUS | Status: DC
  Administered 2012-06-24: 1000 mL via INTRAVENOUS

## 2012-06-24 MED ORDER — HYDROMORPHONE HCL PF 1 MG/ML IJ SOLN
INTRAMUSCULAR | Status: AC
  Filled 2012-06-24: qty 1

## 2012-06-24 MED ORDER — BISOPROLOL FUMARATE 5 MG PO TABS
2.5000 mg | ORAL_TABLET | Freq: Every morning | ORAL | Status: DC
  Filled 2012-06-24: qty 0.5

## 2012-06-24 MED ORDER — PROMETHAZINE HCL 25 MG/ML IJ SOLN: 6.2500 mg | INTRAMUSCULAR | Status: DC | PRN

## 2012-06-24 MED ORDER — TRAVOPROST (BAK FREE) 0.004 % OP SOLN
1.0000 [drp] | Freq: Every day | OPHTHALMIC | Status: DC
  Administered 2012-06-24: 1 [drp] via OPHTHALMIC
  Filled 2012-06-24: qty 2.5

## 2012-06-24 MED ORDER — HYDROMORPHONE HCL PF 1 MG/ML IJ SOLN
0.2500 mg | INTRAMUSCULAR | Status: DC | PRN
  Administered 2012-06-24 (×2): 0.5 mg via INTRAVENOUS

## 2012-06-24 MED ORDER — ACETAMINOPHEN 10 MG/ML IV SOLN: 1000.0000 mg | Freq: Once | INTRAVENOUS | Status: DC | PRN

## 2012-06-24 SURGICAL SUPPLY — 22 items
BAG URINE DRAINAGE (UROLOGICAL SUPPLIES) ×2 IMPLANT
BAG URO CATCHER STRL LF (DRAPE) ×2 IMPLANT
CATH HEMA 3WAY 30CC 22FR COUDE (CATHETERS) IMPLANT
CLOTH BEACON ORANGE TIMEOUT ST (SAFETY) ×2 IMPLANT
DRAPE CAMERA CLOSED 9X96 (DRAPES) ×2 IMPLANT
ELECT BUTTON HF 24-28F 2 30DE (ELECTRODE) IMPLANT
ELECT LOOP MED HF 24F 12D (CUTTING LOOP) IMPLANT
ELECT LOOP MED HF 24F 12D CBL (CLIP) IMPLANT
ELECT REM PT RETURN 9FT ADLT (ELECTROSURGICAL) ×2
ELECT RESECT VAPORIZE 12D CBL (ELECTRODE) IMPLANT
ELECTRODE REM PT RTRN 9FT ADLT (ELECTROSURGICAL) ×1 IMPLANT
GLOVE BIOGEL M STRL SZ7.5 (GLOVE) ×2 IMPLANT
GOWN STRL NON-REIN LRG LVL3 (GOWN DISPOSABLE) ×2 IMPLANT
GOWN STRL REIN XL XLG (GOWN DISPOSABLE) ×2 IMPLANT
HOLDER FOLEY CATH W/STRAP (MISCELLANEOUS) IMPLANT
KIT ASPIRATION TUBING (SET/KITS/TRAYS/PACK) IMPLANT
LOOPS RESECTOSCOPE DISP (ELECTROSURGICAL) ×2 IMPLANT
MANIFOLD NEPTUNE II (INSTRUMENTS) ×2 IMPLANT
PACK CYSTO (CUSTOM PROCEDURE TRAY) ×2 IMPLANT
SUT ETHILON 3 0 PS 1 (SUTURE) IMPLANT
SYRINGE IRR TOOMEY STRL 70CC (SYRINGE) ×2 IMPLANT
TUBING CONNECTING 10 (TUBING) ×2 IMPLANT

## 2012-06-24 NOTE — Op Note (Addendum)
Preoperative diagnosis: 1. Urothelial carcinoma 2. Gross hematuria  Postoperative diagnosis:  1. Urothelial carcinoma 2. Gross hematuria  Procedure:  1. Cystoscopy 2. Transurethral resection of bladder neck tumor and prostatic urethral tumor  Surgeon: Moody Bruins. M.D.  Anesthesia: General  Complications: None  EBL: Minimal  Specimens: 1. 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 He also has undergone radiation therapy for prostate cancer. He recently presented with gross hematuria and underwent office cystoscopy which demonstrated a bladder neck and prostatic urethral tumor.  After reviewing the management options for treatment, he elected to proceed with the above surgical procedure(s). We have discussed the potential benefits and risks of the procedure, side effects of the proposed treatment, the likelihood of the patient achieving the goals of the procedure, and any potential problems that might occur during the procedure or recuperation. Informed consent has been obtained.  Description of procedure:  The patient was taken to the operating room and general anesthesia was induced.  The patient was placed in the dorsal lithotomy position, prepped and draped in the usual sterile fashion, and preoperative antibiotics were administered. A preoperative time-out was performed.   Cystourethroscopy was performed.  The patient's urethra was examined and demonstrated radiation changes along with prior TUR defect from previous procedures. There was papillary tumor along the bladder neck and prostatic urethra and extending to the bladder neck at approximately 2 to 4 o'clock.  The bladder was then systematically examined in its entirety. There was no evidence of any bladder tumors, stones, or other mucosal pathology aside from  trabeculation and a small diverticulum at the dome.  The ureteral orifices were identified and marked so as to be avoided during the procedure.  The prostate urethral tumor along with underlying bladder neck and prostate tissue was then resected utilizing loop cautery resection with the monopolar cutting loop. Once all tumor was resected, attention turned to hemostasis.  Hemostasis was achieved with the cautery and the bladder was emptied and reinspected with no significant bleeding noted at the end of the procedure.    A 3 way catheter was then placed into the bladder and placed on continuous bladder irrigation.  The patient appeared to tolerate the procedure well and without complications.  The patient was able to be awakened and transferred to the recovery unit in satisfactory condition.

## 2012-06-24 NOTE — Transfer of Care (Signed)
Immediate Anesthesia Transfer of Care Note  Patient: Alexander Barnett  Procedure(s) Performed: Procedure(s) with comments: TRANSURETHRAL RESECTION OF THE PROSTATE (TURP) (N/A) - CYSTOSCOPY, TRANSURETHRAL RESECTION OF PROSTATIC URETHRAL TUMOR  CYSTOSCOPY (N/A)  Patient Location: PACU  Anesthesia Type:General  Level of Consciousness: awake, alert , oriented and patient cooperative  Airway & Oxygen Therapy: Pt. Spontaneously breathing and connected to Face mask  Post-op Assessment: Report given to PACU RN and Post -op Vital signs reviewed and stable  Post vital signs: Reviewed and stable  Complications: No apparent anesthesia complications

## 2012-06-24 NOTE — Progress Notes (Signed)
Patient ID: Alexander Barnett, male   DOB: 1920/10/29, 79 y.o.   MRN: [id number]  Post-op note  Subjective: The patient is doing well.  No complaints.  Objective: Vital signs in last 24 hours: Temp:  [97.7 F (36.5 C)-97.9 F (36.6 C)] 97.8 F (36.6 C) (06/09 1715) Pulse Rate:  [52-63] 54 (06/09 1715) Resp:  [10-18] 13 (06/09 1700) BP: (118-151)/(49-71) 136/57 mmHg (06/09 1715) SpO2:  [97 %-100 %] 99 % (06/09 1715)  Intake/Output from previous day:   Intake/Output this shift: Total I/O In: 900 [I.V.:400; Other:300; IV Piggyback:200] Out: 200 [Urine:200]  Physical Exam:  General: Alert and oriented. Urine: Clear on minimal CBI  Lab Results: No results found for this basename: HGB, HCT,  in the last 72 hours  Assessment/Plan: POD#0   1) Continue to monitor 2) Likely will d/c home with catheter tomorrow morning   Rolly Salter, Montez Hageman. MD   LOS: 0 days   Lesleyanne Politte,LES 06/24/2012, 5:28 PM

## 2012-06-24 NOTE — Interval H&P Note (Signed)
History and Physical Interval Note:  06/24/2012 2:42 PM  Alexander Barnett  has presented today for surgery, with the diagnosis of HEMATURIA, PROSTATIC URETHRAL TUMOR  The various methods of treatment have been discussed with the patient and family. After consideration of risks, benefits and other options for treatment, the patient has consented to  Procedure(s) with comments: TRANSURETHRAL RESECTION OF THE PROSTATE (TURP) (N/A) - CYSTOSCOPY, TRANSURETHRAL RESECTION OF PROSTATIC URETHRAL TUMOR  CYSTOSCOPY (N/A) as a surgical intervention .  The patient's history has been reviewed, patient examined, no change in status, stable for surgery.  I have reviewed the patient's chart and labs.  Questions were answered to the patient's satisfaction.     Keela Rubert,LES

## 2012-06-24 NOTE — Anesthesia Preprocedure Evaluation (Addendum)
Anesthesia Evaluation  Patient identified by MRN, date of birth, ID band Patient awake    Reviewed: Allergy & Precautions, H&P , NPO status , Patient's Chart, lab work & pertinent test results, reviewed documented beta blocker date and time   Airway Mallampati: II TM Distance: >3 FB Neck ROM: Full    Dental  (+) Dental Advisory Given and Teeth Intact   Pulmonary neg pulmonary ROS,  breath sounds clear to auscultation        Cardiovascular hypertension, Pt. on medications and Pt. on home beta blockers + dysrhythmias Atrial Fibrillation Rhythm:Regular Rate:Normal     Neuro/Psych negative neurological ROS  negative psych ROS   GI/Hepatic negative GI ROS, Neg liver ROS,   Endo/Other  negative endocrine ROS  Renal/GU negative Renal ROS     Musculoskeletal negative musculoskeletal ROS (+)   Abdominal   Peds  Hematology negative hematology ROS (+)   Anesthesia Other Findings   Reproductive/Obstetrics                         Anesthesia Physical Anesthesia Plan  ASA: II  Anesthesia Plan: General   Post-op Pain Management:    Induction: Intravenous  Airway Management Planned: LMA  Additional Equipment:   Intra-op Plan:   Post-operative Plan: Extubation in OR  Informed Consent: I have reviewed the patients History and Physical, chart, labs and discussed the procedure including the risks, benefits and alternatives for the proposed anesthesia with the patient or authorized representative who has indicated his/her understanding and acceptance.   Dental advisory given  Plan Discussed with: CRNA  Anesthesia Plan Comments:         Anesthesia Quick Evaluation

## 2012-06-24 NOTE — Preoperative (Signed)
Beta Blockers   Reason not to administer Beta Blockers:Not Applicable 

## 2012-06-24 NOTE — Anesthesia Postprocedure Evaluation (Signed)
Anesthesia Post Note  Patient: Alexander Barnett  Procedure(s) Performed: Procedure(s) (LRB): TRANSURETHRAL RESECTION OF THE PROSTATE (TURP) (N/A) CYSTOSCOPY (N/A)  Anesthesia type: General  Patient location: PACU  Post pain: Pain level controlled  Post assessment: Post-op Vital signs reviewed  Last Vitals: BP 120/56  Pulse 58  Temp(Src) 36.6 C (Oral)  Resp 13  SpO2 100%  Post vital signs: Reviewed  Level of consciousness: sedated  Complications: No apparent anesthesia complications

## 2012-06-25 ENCOUNTER — Encounter (HOSPITAL_COMMUNITY): Payer: Self-pay | Admitting: Urology

## 2012-06-25 LAB — BASIC METABOLIC PANEL
Chloride: 104 mEq/L (ref 96–112)
GFR calc Af Amer: 41 mL/min — ABNORMAL LOW (ref >90)
GFR calc non Af Amer: 35 mL/min — ABNORMAL LOW (ref >90)
Potassium: 3.9 mEq/L (ref 3.5–5.1)
Sodium: 136 mEq/L (ref 135–145)

## 2012-06-25 LAB — HEMOGLOBIN AND HEMATOCRIT, BLOOD
HCT: 28.1 % — ABNORMAL LOW (ref 39.0–52.0)
Hemoglobin: 9.3 g/dL — ABNORMAL LOW (ref 13.0–17.0)

## 2012-06-25 NOTE — Care Management Note (Signed)
    Page 1 of 1   06/25/2012     11:45:57 AM   CARE MANAGEMENT NOTE 06/25/2012  Patient:  Alexander Barnett, Alexander Barnett   Account Number:  [id number]  Date Initiated:  06/25/2012  Documentation initiated by:  Lanier Clam  Subjective/Objective Assessment:   ADMITTED W/PROSTATE CA.     Action/Plan:   FROM HOME.   Anticipated DC Date:  06/25/2012   Anticipated DC Plan:  HOME/SELF CARE      DC Planning Services  CM consult      Choice offered to / List presented to:             Status of service:  Completed, signed off Medicare Important Message given?   (If response is "NO", the following Medicare IM given date fields will be blank) Date Medicare IM given:   Date Additional Medicare IM given:    Discharge Disposition:  HOME/SELF CARE  Per UR Regulation:  Reviewed for med. necessity/level of care/duration of stay  If discussed at Long Length of Stay Meetings, dates discussed:    Comments:  06/25/12 Nasirah Sachs RN,BSN NCM [phone] S/P TURP.D/C HOME NO ORDERS OR HH NEEDS.

## 2012-06-25 NOTE — Discharge Summary (Signed)
  Date of admission: 06/24/2012  Date of discharge: 06/25/2012  Admission diagnosis: Hematuria, urothelial carcinoma  Discharge diagnosis: Same  Secondary diagnoses: Chronic kidney disease  History and Physical: For full details, please see admission history and physical. Briefly, Alexander Barnett is a 79 y.o. 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   Hospital Course: He was admitted and underwent cystoscopy with transurethral resection of prostatic urethral tumor.  He tolerated the procedure well.  He was monitored on CBI overnight and was felt stable for D/C home the following morning with his catheter.  Laboratory values:  Recent Labs  06/25/12 0430  HGB 9.3*  HCT 28.1*    Recent Labs  06/25/12 0430  CREATININE 1.66*    Disposition: Home  Discharge instruction: The patient was instructed to be ambulatory but told to refrain from heavy lifting, strenuous activity.  Discharge medications:    Medication List    TAKE these medications       amLODipine 5 MG tablet  Commonly known as:  NORVASC  Take 5 mg by mouth every morning.     bisoprolol 5 MG tablet  Commonly known as:  ZEBETA  Take 2.5 mg by mouth every morning.     cholecalciferol 1000 UNITS tablet  Commonly known as:  VITAMIN D  Take 1,000 Units by mouth daily.     ciprofloxacin 500 MG tablet  Commonly known as:  CIPRO  Take 1 tablet (500 mg total) by mouth 2 (two) times daily. Begin one day prior to catheter removal.     docusate sodium 100 MG capsule  Commonly known as:  COLACE  Take 100 mg by mouth daily as needed for constipation.     finasteride 5 MG tablet  Commonly known as:  PROSCAR  Take 5 mg by mouth daily.     HYDROcodone-acetaminophen 5-325 MG per tablet  Commonly known as:  NORCO/VICODIN  Take 1-2 tablets by mouth every 6 (six) hours as needed for pain.     multivitamin with minerals Tabs  Take 1 tablet by mouth daily.     olmesartan 40 MG tablet  Commonly known as:  BENICAR  Take 40 mg by mouth every morning.     travoprost (benzalkonium) 0.004 % ophthalmic solution  Commonly known as:  TRAVATAN  Place 1 drop into both eyes at bedtime.        Followup:      Follow-up Information   Follow up with Crecencio Mc, MD. (Will call to schedule for next Tuesday.)    Contact information:   [address] [place] [address] [phone]

## 2012-07-09 ENCOUNTER — Encounter: Payer: Self-pay | Admitting: Urology

## 2012-07-14 ENCOUNTER — Encounter (HOSPITAL_COMMUNITY): Payer: Self-pay

## 2012-07-14 ENCOUNTER — Emergency Department (HOSPITAL_COMMUNITY)
Admission: EM | Admit: 2012-07-14 | Discharge: 2012-07-14 | Disposition: A | Discharge status: Home / Self Care | Payer: Medicare Other | Attending: Emergency Medicine | Admitting: Emergency Medicine

## 2012-07-14 DIAGNOSIS — R39198 Other difficulties with micturition: Secondary | ICD-10-CM

## 2012-07-14 DIAGNOSIS — R300 Dysuria: Secondary | ICD-10-CM | POA: Insufficient documentation

## 2012-07-14 DIAGNOSIS — Z87891 Personal history of nicotine dependence: Secondary | ICD-10-CM | POA: Insufficient documentation

## 2012-07-14 DIAGNOSIS — Z79899 Other long term (current) drug therapy: Secondary | ICD-10-CM | POA: Insufficient documentation

## 2012-07-14 DIAGNOSIS — Z8679 Personal history of other diseases of the circulatory system: Secondary | ICD-10-CM | POA: Insufficient documentation

## 2012-07-14 DIAGNOSIS — R3919 Other difficulties with micturition: Secondary | ICD-10-CM | POA: Insufficient documentation

## 2012-07-14 DIAGNOSIS — R3915 Urgency of urination: Secondary | ICD-10-CM | POA: Insufficient documentation

## 2012-07-14 DIAGNOSIS — Z8739 Personal history of other diseases of the musculoskeletal system and connective tissue: Secondary | ICD-10-CM | POA: Insufficient documentation

## 2012-07-14 DIAGNOSIS — I10 Essential (primary) hypertension: Secondary | ICD-10-CM | POA: Insufficient documentation

## 2012-07-14 DIAGNOSIS — R109 Unspecified abdominal pain: Secondary | ICD-10-CM | POA: Insufficient documentation

## 2012-07-14 DIAGNOSIS — R319 Hematuria, unspecified: Secondary | ICD-10-CM | POA: Insufficient documentation

## 2012-07-14 LAB — URINALYSIS, ROUTINE W REFLEX MICROSCOPIC
Bilirubin Urine: NEGATIVE
Glucose, UA: NEGATIVE mg/dL
Ketones, ur: NEGATIVE mg/dL
Nitrite: NEGATIVE
Protein, ur: >300 mg/dL — AB
Specific Gravity, Urine: 1.013 (ref 1.005–1.030)
Urobilinogen, UA: 0.2 mg/dL (ref 0.0–1.0)
pH: 6 (ref 5.0–8.0)

## 2012-07-14 LAB — URINE MICROSCOPIC-ADD ON

## 2012-07-14 NOTE — ED Notes (Signed)
After foley cath placed pt had dark red urine with blood clots. Irrigated with sterile water blood clots noted and urine is light pink color now. Pt output total is .

## 2012-07-14 NOTE — ED Notes (Signed)
Irrigated with another of sterile water. Urine is still light pink in color no blood clots noted when irrigating.

## 2012-07-14 NOTE — ED Notes (Signed)
He states he had recent cystoscope (~2 weeks) by Dr. Laverle Patter.  He is here today with c/o persistent hematuria, including clots.

## 2012-07-14 NOTE — ED Provider Notes (Signed)
History    CSN: [id number] Arrival date & time 07/14/12  1000  First MD Initiated Contact with Patient 07/14/12 1012     Chief Complaint  Patient presents with  . Hematuria   (Consider location/radiation/quality/duration/timing/severity/associated sxs/prior Treatment) HPI Pt is an 79yo male with hx of TURP and cystoscopy on 06/24/12 for chronic hematuria as a result of prostate cancer, c/o continued hematuria associated with clots and inability to void since 0600 this morning.  Pt states he has known hematuria but clots and difficulty voiding are new as of this morning.  Pt also reports moderate to severe bladder spasms that started this morning, states it feels like an intense urge to urinate but he cannot go.  States that he called Dr. Vevelyn Royals office twice to let them know what was happening and to tell them he was on his way to the ED.  Denies fever, n/v/d.   Past Medical History  Diagnosis Date  . Hypertension   . Arthritis   . Dysrhythmia 06/2006    1 time years ago A-Fib.-sees Dr. Armanda Magic    Past Surgical History  Procedure Laterality Date  . Tonsillectomy      as child  . Hernia repair  05/2002    right  . Eye surgery  02/2004    bil.cataract w/ lens implant  . Mohs surgery  02/2002    nose  . Nephrectomy  03/2007    left  . Prostate surgery  12/2003  . [place]  04/1998    to prostate  . Imrt radiation  04/2006    for prostate cancer  . Prostate surgery  03/1998    surgery in prostate area  . Urinary surgery  03/1998    bleeding  . Hyperbaric treatments      40 treatments for prostate-to try to stop bleeding  . Transurethral resection of prostate N/A 06/24/2012    Procedure: TRANSURETHRAL RESECTION OF THE PROSTATE (TURP);  Surgeon: Crecencio Mc, MD;  Location: [facility];  Service: Urology;  Laterality: N/A;  CYSTOSCOPY, TRANSURETHRAL RESECTION OF PROSTATIC URETHRAL TUMOR   . Cystoscopy N/A 06/24/2012    Procedure: CYSTOSCOPY;  Surgeon: Crecencio Mc, MD;  Location: [facility]  [facility];  Service: Urology;  Laterality: N/A;   History reviewed. No pertinent family history. History  Substance Use Topics  . Smoking status: Former Smoker -- 1.50 packs/day for 20 years    Types: Cigarettes    Quit date: 06/21/1962  . Smokeless tobacco: Not on file  . Alcohol Use: 0.6 oz/week    1 Glasses of wine per week     Comment: glass wine nightly occassionally    Review of Systems  Constitutional: Negative for fever and chills.  Gastrointestinal: Positive for abdominal pain ( lower abdomen, pressure).  Genitourinary: Positive for urgency, hematuria, decreased urine volume and difficulty urinating. Negative for dysuria, frequency, flank pain, penile swelling, scrotal swelling, penile pain and testicular pain.  All other systems reviewed and are negative.    Allergies  Review of patient's allergies indicates no known allergies.  Home Medications   Current Outpatient Rx  Name  Route  Sig  Dispense  Refill  . amLODipine (NORVASC) 5 MG tablet   Oral   Take 5 mg by mouth every morning.         . bisoprolol (ZEBETA) 5 MG tablet   Oral   Take 2.5 mg by mouth every morning.         . cholecalciferol (VITAMIN D) 1000 UNITS tablet  Oral   Take 1,000 Units by mouth daily.         . ciprofloxacin (CIPRO) 500 MG tablet   Oral   Take 1 tablet (500 mg total) by mouth 2 (two) times daily. Begin one day prior to catheter removal.   6 tablet   0   . docusate sodium (COLACE) 100 MG capsule   Oral   Take 100 mg by mouth daily as needed for constipation.         . finasteride (PROSCAR) 5 MG tablet   Oral   Take 5 mg by mouth daily.         . mirabegron ER (MYRBETRIQ) 25 MG TB24   Oral   Take 25 mg by mouth daily as needed (bladder spasms).         . Multiple Vitamin (MULTIVITAMIN WITH MINERALS) TABS   Oral   Take 1 tablet by mouth daily.         Marland Kitchen olmesartan (BENICAR) 40 MG tablet   Oral   Take 40 mg by mouth every morning.         . travoprost,  benzalkonium, (TRAVATAN) 0.004 % ophthalmic solution   Both Eyes   Place 1 drop into both eyes at bedtime.          BP 132/65  Pulse 66  Temp(Src) 98.3 F (36.8 C) (Oral)  Resp 18  SpO2 96% Physical Exam  Nursing note and vitals reviewed. Constitutional: He appears well-developed and well-nourished. No distress.  Elderly male lying comfortably in exam bed. NAD.  HENT:  Head: Normocephalic and atraumatic.  Eyes: Conjunctivae are normal. No scleral icterus.  Neck: Normal range of motion.  Cardiovascular: Normal rate, regular rhythm and normal heart sounds.   Pulmonary/Chest: Effort normal and breath sounds normal. No respiratory distress. He has no wheezes. He has no rales. He exhibits no tenderness.  Abdominal: Soft. Bowel sounds are normal. He exhibits distension (mild, lower abdomen, suprapubic region   ). He exhibits no mass. There is tenderness ( lower abdomen, suprapubic region). There is no rebound and no guarding.  Genitourinary: Testes normal and penis normal. Right testis shows no mass, no swelling and no tenderness. Right testis is descended. Left testis shows no mass, no swelling and no tenderness. Left testis is descended. No penile tenderness.  Musculoskeletal: Normal range of motion.  Neurological: He is alert.  Skin: Skin is warm and dry. He is not diaphoretic.    ED Course  Procedures (including critical care time) Labs Reviewed  URINALYSIS, ROUTINE W REFLEX MICROSCOPIC - Abnormal; Notable for the following:    Color, Urine RED (*)    APPearance CLOUDY (*)    Hgb urine dipstick LARGE (*)    Protein, ur >300 (*)    Leukocytes, UA TRACE (*)    All other components within normal limits  URINE MICROSCOPIC-ADD ON   No results found. 1. Hematuria   2. Difficulty voiding     MDM  Pt likely has clot causing pt's symptoms.  Will have large foley catheter placed and irrigated to allow bladder to drain, then reassess.   Foley catheter placed by Dr. Denton Lank:  dark red urine with blood clots after cath placement, irrigated with sterile water, blood clots noted. Urine is now light pink.  Total output .  Foley was irrigated again with sterile water, came out.  Total of urine. recorded by Eliezer Lofts RN  Will have pt continue taking ciprofloxacin, provide  pt with leg bag and have him f/u with Dr. Vevelyn Royals office next week. Advised to call to be seen tomorrow morning if possible.  Pt verbalized understanding and agreement with tx plan. Vitals: unremarkable. Discharged in stable condition.    Discussed pt with attending during ED encounter.    Junius Finner, PA-C 07/14/12 1722

## 2012-07-15 NOTE — ED Provider Notes (Signed)
Medical screening examination/treatment/procedure(s) were conducted as a shared visit with non-physician practitioner(s) and myself.  I personally evaluated the patient during the encounter Pt c/o hematuria, urinary retention. Foley placed by myself using sterile technique. Symptoms improved. abd soft nt.   Suzi Roots, MD 07/15/12 [phone]

## 2012-07-24 ENCOUNTER — Other Ambulatory Visit: Payer: Self-pay | Admitting: Urology

## 2012-07-29 ENCOUNTER — Encounter (HOSPITAL_COMMUNITY): Payer: Self-pay | Admitting: Pharmacy Technician

## 2012-07-31 ENCOUNTER — Other Ambulatory Visit (HOSPITAL_COMMUNITY): Payer: Self-pay | Admitting: *Deleted

## 2012-08-01 ENCOUNTER — Encounter (HOSPITAL_COMMUNITY)
Admission: RE | Admit: 2012-08-01 | Discharge: 2012-08-01 | Disposition: A | Discharge status: Home / Self Care | Payer: Medicare Other | Source: Ambulatory Visit | Attending: Urology | Admitting: Urology

## 2012-08-01 ENCOUNTER — Encounter (HOSPITAL_COMMUNITY): Payer: Self-pay

## 2012-08-01 DIAGNOSIS — R310 Gross hematuria: Secondary | ICD-10-CM | POA: Insufficient documentation

## 2012-08-01 DIAGNOSIS — Z01812 Encounter for preprocedural laboratory examination: Secondary | ICD-10-CM | POA: Insufficient documentation

## 2012-08-01 DIAGNOSIS — N3090 Cystitis, unspecified without hematuria: Secondary | ICD-10-CM | POA: Insufficient documentation

## 2012-08-01 HISTORY — DX: Personal history of other medical treatment: Z92.89

## 2012-08-01 HISTORY — DX: Hematuria, unspecified: R31.9

## 2012-08-01 HISTORY — DX: Personal history of other malignant neoplasm of kidney: Z85.528

## 2012-08-01 HISTORY — DX: Anemia, unspecified: D64.9

## 2012-08-01 HISTORY — DX: Personal history of other malignant neoplasm of skin: Z85.828

## 2012-08-01 LAB — BASIC METABOLIC PANEL
Chloride: 105 mEq/L (ref 96–112)
Creatinine, Ser: 2.13 mg/dL — ABNORMAL HIGH (ref 0.50–1.35)
GFR calc Af Amer: 30 mL/min — ABNORMAL LOW (ref >90)
GFR calc non Af Amer: 26 mL/min — ABNORMAL LOW (ref >90)
Potassium: 4.8 mEq/L (ref 3.5–5.1)

## 2012-08-01 LAB — CBC
MCHC: 32.1 g/dL (ref 30.0–36.0)
MCV: 86.7 fL (ref 78.0–100.0)
Platelets: 296 10*3/uL (ref 150–400)
RDW: 18.4 % — ABNORMAL HIGH (ref 11.5–15.5)
WBC: 10.5 10*3/uL (ref 4.0–10.5)

## 2012-08-01 NOTE — Patient Instructions (Addendum)
Bernabe Hoaglin  08/01/2012                           YOUR PROCEDURE IS SCHEDULED ON: 08/05/12               PLEASE REPORT TO SHORT STAY CENTER AT :  12:30 PM               CALL THIS NUMBER IF ANY PROBLEMS THE DAY OF SURGERY :               [phone]                      REMEMBER:   Do not eat food or drink liquids AFTER MIDNIGHT  May have clear liquids UNTIL 6 HOURS BEFORE SURGERY  (9:00 AM)  Clear liquids include soda, tea, black coffee, apple or grape juice, broth.  Take these medicines the morning of surgery with A SIP OF WATER:  AMLODIPINE / BISOPROLOL / FINASTERIDE   Do not wear jewelry, make-up   Do not wear lotions, powders, or perfumes.   Do not shave legs or underarms 12 hrs. before surgery (men may shave face)  Do not bring valuables to the hospital.  Contacts, dentures or bridgework may not be worn into surgery.  Leave suitcase in the car. After surgery it may be brought to your room.  For patients admitted to the hospital more than one night, checkout time is 11:00                          The day of discharge.   Patients discharged the day of surgery will not be allowed to drive home                             If going home same day of surgery, must have someone stay with you first                           24 hrs at home and arrange for some one to drive you home from hospital.    Special Instructions:   Please read over the following fact sheets that you were given:                                 1.[facility] PREPARING FOR SURGERY SHEET                                                X_____________________________________________________________________        Failure to follow these instructions may result in cancellation of your surgery

## 2012-08-01 NOTE — Progress Notes (Signed)
Abnormal CBC / BMET faxed to Dr.Borden 

## 2012-08-01 NOTE — Progress Notes (Signed)
08/01/12 1415  OBSTRUCTIVE SLEEP APNEA  Have you ever been diagnosed with sleep apnea through a sleep study? No  Do you snore loudly (loud enough to be heard through closed doors)?  0  Do you often feel tired, fatigued, or sleepy during the daytime? 1  Has anyone observed you stop breathing during your sleep? 0  Do you have, or are you being treated for high blood pressure? 1  BMI more than 35 kg/m2? 0  Age over 77 years old? 1  Neck circumference greater than 40 cm/18 inches? 0  Gender: 1  Obstructive Sleep Apnea Score 4  Score 4 or greater  Results sent to PCP

## 2012-08-02 NOTE — H&P (Signed)
History of Present Illness  Alexander Barnett is an 79 year old with the following urologic history:  1) Prostate cancer: He is s/p treatment with EBRT (78Gy)/androgen deprivation (6 months) completed in May 2008. He has had no evidence for recurrence thus far. His TURP specimen from July 2009 did not demonstrate recurrent cancer. A repeat TURP in November 2009 also did not demonstrate malignancy.  TNM Stage: cT1c Nx Mx     Gleason Score: 3+4=7     Pretreatment PSA: 15.09 PSA nadir after treatment: <0.04 (Feb 2010)   2) BPH/Urinary retention: He has undergone procedures including TUMT and TUNA in the past.  He also has been on Avodart in the past which he is no longer taking due to breast pain. He has had gross hematuria which may be related to his BPH. He developed urinary retention after his nephroureterectomy and underwent a urodynamic evaluation that was consistent with probable outlet obstruction. He underwent a TURP in July 2009. He did have some incontinence after his TURP although this resolved after stopping his alpha blocker. He did again develop urinary retention which appeared related to clot retention in October 2009. He was taken to the OR in November 2009 and had non-healing prostate tissue which appeared to be the most likely cause of his symptoms and hematuria and underwent resection of residual prostate tissue with a TURP. He has had intermittently problems with urinary retention which have appeared to be related to clot retention and hematuria from a prostatic source. He also has mixed urge and stress incontinence symptoms since his last TURP. 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 He was found to have a low grade, Ta recurrence in June 2009 in the bladder. He has  subsequently required further treatment for bladder recurences.  March 2009: Left L/S nephroureterectomy for pT3 Nx Mx (invasion of renal parenchyma), low grade urothelial carcinoma of the left renal pelvis June 2009: TURBT/[facility], Ta/low grade September 2009: TURBT/[facility], Ta/low grade July 2010: TURBT, Ta/low grade Jun 2014: TUR of bladder neck/prostatic urethral tumor - Ta/low grade  4) Chronic kidney disease: Baseline Cr is 1.6.  5) Gross hematuria: He has had hematuria which has required hospitalization and transfusion in the past. This appears to be related to his history of radiation therapy. 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 He required placement of a right nephrostomy tube in January 2010 for management and was able to have his urethral catheter removed. He was treated with hyperbaric oxygen therapy in February 2010. He did develop recurrent gross hematuria and clot retention in November 2010 but was found to have an isolated bleeding vessel which was able to be fulgurated. In June 2012, he again presented with clot retention and bleeding and required hospital admission, clot evacuation, and fulguration of bleeding site at the bladder neck. He had persistent bleeding after his TUR in June 2104 requiring multiple catheters and fulguration of a bleeding site at the bladder neck.  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 These areas were brush biopsed and were negative for urothelial carcinoma.   Interval  history:  He follows up today.  He has continued to have difficulty with gross hematuria.  Since his catheter was replaced last week, he has continued to note intermittent hematuria.  His urine becomes quite clear  when he lays down at night to rest but he developed gross hematuria again in the morning when he is active.  His catheter has been draining well and he has had no episodes of clot retention.   Past Medical History Problems  1. History of  Arthritis V13.4 2. History of  Atrial Fibrillation 427.31 3. History of  Benign Prostatic Hyperplasia 600.20 4. History of  Bladder Irrigation 5. History of  Glaucoma 365.9 6. History of  Hypertension 401.9 7. Prostate Cancer 185 8. 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 History of  Cataract Surgery 2. History of  Cystoscopy With Biopsy 3. History of  Cystoscopy With Fulguration 4. History of  Cystoscopy With Fulguration Medium Lesion (2-5cm) 5. History of  Cystoscopy With Fulguration Small Lesion (5-51mm) 6. History of  Cystoscopy With Fulguration Small Lesion (5-51mm) 7. History of  Cystoscopy With Insertion Of Ureteral Stent Right 8. History of  Cystoscopy With Insertion Of Ureteral Stent Right 9. History of  Cystoscopy With Ureteroscopy Left 10. History of  Cystoscopy With Ureteroscopy Right 11. History of  Cystoscopy With Ureteroscopy Right 12. History of  Cystourethroscopy With Irrigation And Evacuation Of Clots 13. History of  Cystourethroscopy With Irrigation And Evacuation Of Clots 14. History of  Cystourethroscopy With Irrigation And Evacuation Of Clots 15. History of  Inguinal Hernia Repair 16. History of  Kidney Surgery Laparoscopically Assisted Nephroureterectomy 17. History of  Laser Vaporization With Transurethral Resection Of Prostate 18. History of  Transuret Resect Of Resid Obstr Tissue After 90 Days Postop 19. History of  Transurethral Resection Of Bladder Neck 20. History of  Transurethral Resection Of Prostate (TURP)  Current Meds 1. AmLODIPine Besylate 5 MG Oral Tablet; Therapy: (Recorded:07Mar2012) to 2. Benicar 40 MG  Oral Tablet; Therapy: 29Nov2011 to 3. Bisoprolol Fumarate 5 MG Oral Tablet; Therapy: (Recorded:05Jun2012) to 4. Ciprofloxacin HCl 500 MG Oral Tablet; Therapy: (Recorded:17Jun2014) to 5. Dorzolamide HCl 2 % Ophthalmic Solution; Therapy: (Recorded:05Jun2012) to 6. Hydrocodone-Acetaminophen 5-325 MG Oral Tablet; Therapy: 09Jun2014 to 7. Multivitamin/Iron TABS; Therapy: (Recorded:16Nov2007) to 8. Travatan 0.004 % SOLN; Therapy: (Recorded:16Nov2007) to 9. Vitamin B12 TABS; Therapy: (Recorded:16Nov2007) to  Allergies Medication  1. No Known Drug Allergies  Review of Systems  Genitourinary: hematuria.  Constitutional: no fever and no recent weight loss.    Physical Exam Constitutional: Well nourished and well developed . No acute distress.  ENT:. The ears and nose are normal in appearance.  Neck: The appearance of the neck is normal and no neck mass is present.  Pulmonary: No respiratory distress, normal respiratory rhythm and effort and clear bilateral breath sounds.  Cardiovascular: Heart rate and rhythm are normal . No peripheral edema.  Genitourinary: He has an indwelling catheter which is draining well but grossly pink urine.    Assessment Assessed  1. Gross Hematuria 599.71  Plan Gross Hematuria (599.71)  1. Follow-up Office  Follow-up  Requested for: 08Jul2014  Discussion/Summary  1.  Gross hematuria: His hematuria remains persistent and I am hesitant to remove his catheter.  Considering the significant difficulty he has had since his TUR of his urothelial tumor, I have recommended we proceed with cystoscopy under anesthesia, clot evacuation, and fulguration of any  bleeding sites.  He will be admitted to the hospital overnight for a voiding trial.  We have reviewed the potential risks, complications, and alternative options were discussed.  Informed consent was obtained.  2.  Urothelial carcinoma: He currently is scheduled to be seen in October for surveillance cystoscopy.  3.   Prostate cancer: He is due for PSA surveillance in May 2015.  4.  LUTS/incontinence: He will continue current therapy with finasteride and Vesicare.  Cc: Dr. Mila Palmer     Signatures Electronically signed by : Heloise Purpura, M.D.; Jul 23 2012  1:27PM

## 2012-08-04 LAB — URINE CULTURE

## 2012-08-05 ENCOUNTER — Ambulatory Visit (HOSPITAL_COMMUNITY): Payer: Medicare Other | Admitting: *Deleted

## 2012-08-05 ENCOUNTER — Encounter (HOSPITAL_COMMUNITY): Payer: Self-pay | Admitting: *Deleted

## 2012-08-05 ENCOUNTER — Encounter (HOSPITAL_COMMUNITY)
Admission: RE | Disposition: A | Discharge status: Home / Self Care | Payer: Self-pay | Source: Ambulatory Visit | Attending: Urology

## 2012-08-05 ENCOUNTER — Observation Stay (HOSPITAL_COMMUNITY)
Admission: RE | Admit: 2012-08-05 | Discharge: 2012-08-06 | Disposition: A | Discharge status: Home / Self Care | Payer: Medicare Other | Source: Ambulatory Visit | Attending: Urology | Admitting: Urology

## 2012-08-05 DIAGNOSIS — C61 Malignant neoplasm of prostate: Secondary | ICD-10-CM | POA: Insufficient documentation

## 2012-08-05 DIAGNOSIS — I129 Hypertensive chronic kidney disease with stage 1 through stage 4 chronic kidney disease, or unspecified chronic kidney disease: Secondary | ICD-10-CM | POA: Insufficient documentation

## 2012-08-05 DIAGNOSIS — R310 Gross hematuria: Principal | ICD-10-CM | POA: Insufficient documentation

## 2012-08-05 DIAGNOSIS — N189 Chronic kidney disease, unspecified: Secondary | ICD-10-CM | POA: Insufficient documentation

## 2012-08-05 DIAGNOSIS — Z9079 Acquired absence of other genital organ(s): Secondary | ICD-10-CM | POA: Insufficient documentation

## 2012-08-05 DIAGNOSIS — C659 Malignant neoplasm of unspecified renal pelvis: Secondary | ICD-10-CM | POA: Insufficient documentation

## 2012-08-05 DIAGNOSIS — N3946 Mixed incontinence: Secondary | ICD-10-CM | POA: Insufficient documentation

## 2012-08-05 DIAGNOSIS — N401 Enlarged prostate with lower urinary tract symptoms: Secondary | ICD-10-CM | POA: Insufficient documentation

## 2012-08-05 DIAGNOSIS — R339 Retention of urine, unspecified: Secondary | ICD-10-CM | POA: Insufficient documentation

## 2012-08-05 DIAGNOSIS — Z905 Acquired absence of kidney: Secondary | ICD-10-CM | POA: Insufficient documentation

## 2012-08-05 DIAGNOSIS — C679 Malignant neoplasm of bladder, unspecified: Secondary | ICD-10-CM | POA: Insufficient documentation

## 2012-08-05 DIAGNOSIS — Z79899 Other long term (current) drug therapy: Secondary | ICD-10-CM | POA: Insufficient documentation

## 2012-08-05 DIAGNOSIS — N138 Other obstructive and reflux uropathy: Secondary | ICD-10-CM | POA: Insufficient documentation

## 2012-08-05 HISTORY — PX: CYSTOSCOPY: SHX5120

## 2012-08-05 SURGERY — CYSTOSCOPY
Anesthesia: General | Wound class: Clean Contaminated

## 2012-08-05 MED ORDER — VANCOMYCIN HCL 500 MG IV SOLR
500.0000 mg | INTRAVENOUS | Status: DC
  Filled 2012-08-05: qty 500

## 2012-08-05 MED ORDER — CIPROFLOXACIN IN D5W 400 MG/200ML IV SOLN
INTRAVENOUS | Status: AC
  Filled 2012-08-05: qty 200

## 2012-08-05 MED ORDER — BISOPROLOL FUMARATE 5 MG PO TABS
2.5000 mg | ORAL_TABLET | Freq: Every morning | ORAL | Status: DC
  Administered 2012-08-06: 2.5 mg via ORAL
  Filled 2012-08-05: qty 0.5

## 2012-08-05 MED ORDER — SODIUM CHLORIDE 0.45 % IV SOLN
INTRAVENOUS | Status: DC
  Administered 2012-08-05: 20:00:00 via INTRAVENOUS

## 2012-08-05 MED ORDER — HYDROCODONE-ACETAMINOPHEN 5-325 MG PO TABS
1.0000 | ORAL_TABLET | ORAL | Status: DC | PRN
  Administered 2012-08-05: 2 via ORAL
  Filled 2012-08-05: qty 2

## 2012-08-05 MED ORDER — FENTANYL CITRATE 0.05 MG/ML IJ SOLN
INTRAMUSCULAR | Status: DC | PRN
  Administered 2012-08-05 (×4): 25 ug via INTRAVENOUS

## 2012-08-05 MED ORDER — TRAVOPROST 0.004 % OP SOLN: 1.0000 [drp] | Freq: Every day | OPHTHALMIC | Status: DC

## 2012-08-05 MED ORDER — CIPROFLOXACIN IN D5W 400 MG/200ML IV SOLN
400.0000 mg | INTRAVENOUS | Status: AC
  Administered 2012-08-05: 400 mg via INTRAVENOUS

## 2012-08-05 MED ORDER — LIDOCAINE HCL (CARDIAC) 20 MG/ML IV SOLN
INTRAVENOUS | Status: DC | PRN
  Administered 2012-08-05: 50 mg via INTRAVENOUS

## 2012-08-05 MED ORDER — ACETAMINOPHEN 325 MG PO TABS: 650.0000 mg | ORAL_TABLET | ORAL | Status: DC | PRN

## 2012-08-05 MED ORDER — VITAMIN D3 25 MCG (1000 UNIT) PO TABS
1000.0000 [IU] | ORAL_TABLET | Freq: Every day | ORAL | Status: DC
  Administered 2012-08-06: 1000 [IU] via ORAL
  Filled 2012-08-05: qty 1

## 2012-08-05 MED ORDER — LACTATED RINGERS IV SOLN
INTRAVENOUS | Status: DC
  Administered 2012-08-05: 1000 mL via INTRAVENOUS

## 2012-08-05 MED ORDER — STERILE WATER FOR IRRIGATION IR SOLN
Status: DC | PRN
  Administered 2012-08-05: 3000 mL
  Administered 2012-08-05: 9000 mL

## 2012-08-05 MED ORDER — LACTATED RINGERS IV SOLN: INTRAVENOUS | Status: DC

## 2012-08-05 MED ORDER — FENTANYL CITRATE 0.05 MG/ML IJ SOLN: 25.0000 ug | INTRAMUSCULAR | Status: DC | PRN

## 2012-08-05 MED ORDER — IRBESARTAN 300 MG PO TABS
300.0000 mg | ORAL_TABLET | Freq: Every day | ORAL | Status: DC
  Filled 2012-08-05: qty 1

## 2012-08-05 MED ORDER — TRAVOPROST (BAK FREE) 0.004 % OP SOLN
1.0000 [drp] | Freq: Every day | OPHTHALMIC | Status: DC
  Administered 2012-08-06: 1 [drp] via OPHTHALMIC
  Filled 2012-08-05: qty 2.5

## 2012-08-05 MED ORDER — HYDROCODONE-ACETAMINOPHEN 5-325 MG PO TABS: 1.0000 | ORAL_TABLET | Freq: Four times a day (QID) | ORAL | Status: DC | PRN

## 2012-08-05 MED ORDER — VANCOMYCIN HCL IN DEXTROSE 1-5 GM/200ML-% IV SOLN
1000.0000 mg | Freq: Once | INTRAVENOUS | Status: AC
  Administered 2012-08-05: 1000 mg via INTRAVENOUS

## 2012-08-05 MED ORDER — DOCUSATE SODIUM 100 MG PO CAPS
100.0000 mg | ORAL_CAPSULE | Freq: Two times a day (BID) | ORAL | Status: DC
  Administered 2012-08-05 – 2012-08-06 (×2): 100 mg via ORAL
  Filled 2012-08-05 (×3): qty 1

## 2012-08-05 MED ORDER — PROPOFOL 10 MG/ML IV BOLUS
INTRAVENOUS | Status: DC | PRN
  Administered 2012-08-05: 180 mg via INTRAVENOUS

## 2012-08-05 MED ORDER — NITROFURANTOIN MONOHYD MACRO 100 MG PO CAPS: 100.0000 mg | ORAL_CAPSULE | Freq: Two times a day (BID) | ORAL | Status: DC

## 2012-08-05 MED ORDER — AMLODIPINE BESYLATE 5 MG PO TABS
5.0000 mg | ORAL_TABLET | Freq: Every morning | ORAL | Status: DC
  Administered 2012-08-06: 5 mg via ORAL
  Filled 2012-08-05: qty 1

## 2012-08-05 MED ORDER — PROMETHAZINE HCL 25 MG/ML IJ SOLN: 6.2500 mg | INTRAMUSCULAR | Status: DC | PRN

## 2012-08-05 MED ORDER — OLMESARTAN MEDOXOMIL 40 MG PO TABS
40.0000 mg | ORAL_TABLET | Freq: Every day | ORAL | Status: DC
  Administered 2012-08-06: 40 mg via ORAL
  Filled 2012-08-05: qty 1

## 2012-08-05 MED ORDER — VANCOMYCIN HCL IN DEXTROSE 1-5 GM/200ML-% IV SOLN
INTRAVENOUS | Status: AC
  Filled 2012-08-05: qty 200

## 2012-08-05 MED ORDER — FINASTERIDE 5 MG PO TABS
5.0000 mg | ORAL_TABLET | Freq: Every morning | ORAL | Status: DC
  Administered 2012-08-06: 5 mg via ORAL
  Filled 2012-08-05: qty 1

## 2012-08-05 MED ORDER — FERROUS SULFATE 325 (65 FE) MG PO TABS
325.0000 mg | ORAL_TABLET | Freq: Two times a day (BID) | ORAL | Status: DC
  Administered 2012-08-05 – 2012-08-06 (×2): 325 mg via ORAL
  Filled 2012-08-05 (×3): qty 1

## 2012-08-05 SURGICAL SUPPLY — 13 items
BAG URO CATCHER STRL LF (DRAPE) ×2 IMPLANT
CLOTH BEACON ORANGE TIMEOUT ST (SAFETY) ×2 IMPLANT
DRAPE CAMERA CLOSED 9X96 (DRAPES) ×2 IMPLANT
ELECT REM PT RETURN 9FT ADLT (ELECTROSURGICAL) ×2
ELECTRODE REM PT RTRN 9FT ADLT (ELECTROSURGICAL) ×1 IMPLANT
GLOVE BIOGEL M STRL SZ7.5 (GLOVE) ×2 IMPLANT
GOWN STRL NON-REIN LRG LVL3 (GOWN DISPOSABLE) ×4 IMPLANT
LOOPS CUTTING 24FR (ELECTROSURGICAL) ×2 IMPLANT
MANIFOLD NEPTUNE II (INSTRUMENTS) ×2 IMPLANT
PACK CYSTO (CUSTOM PROCEDURE TRAY) ×2 IMPLANT
SYRINGE IRR TOOMEY STRL 70CC (SYRINGE) ×2 IMPLANT
TUBING CONNECTING 10 (TUBING) ×2 IMPLANT
WATER STERILE IRR 3000ML UROMA (IV SOLUTION) ×6 IMPLANT

## 2012-08-05 NOTE — Op Note (Signed)
Preoperative diagnosis:  1. Gross hematuria 2.  History of radiation cystitis 3.  History of urothelial carcinoma 4.  History of prostate cancer  Postoperative diagnosis: Same as above  Procedure(s): 1. Cystoscopy 2.  Clot evacuation 3.  Fulguration of bleeding site at bladder neck  Surgeon: Dr. Rolly Salter, Jr  Anesthesia: General  Complications: None  EBL: Minimal  Intraoperative findings: There was significant clot in the bladder which required evacuation.  In addition, there was an active bleeding site At the anterior bladder neck.  Indication: Alexander Barnett is an 79 year old gentleman with a history of prostate cancer and urothelial carcinoma.  He received radiation therapy for prostate cancer and has had issues related to radiation cystitis/prostatitis.  Recently, he developed recurrent gross hematuria and was found to have a recurrent low-grade urothelial carcinoma at the bladder neck and prostatic urethra.  He underwent transurethral resection and has had ongoing issues with gross hematuria since his procedure.  This is been managed with a catheter intermittently.  Due to refractory bleeding, he presents today for further evaluation including clot evacuation and fulguration of any bleeding sites.  The potential risks, complications, and alternative options have been discussed in detail and informed consent obtained.  Description of procedure:  The patient was taken to the operating room and a general anesthetic was administered.  He was given preoperative antibiotics, placed in the dorsal lithotomy position, and prepped and draped in the usual sterile fashion.  Next a preoperative timeout was performed.  Specifically, culture specific antibiotics were administered based on his most recent urine culture.   Cystourethroscopy was then performed which revealed a normal anterior urethra.  Examination of the prostatic urethra demonstrated findings consistent with his prior radiation  therapy and prior transurethral resections with poorly healed tissue.  There was some evidence of friable tissue with bleeding and inspection of the bladder revealed a large clot within the bladder.  This clot was evacuated from the bladder.  Once the clot was entirely evacuated, reinspection with the resectoscope was performed.  There was noted to be an active bleeding site at 12:00 located at the bladder neck.  The site was fulgurated until it stopped.  There were additional bleeding sites noted in the anterior aspect of the prostatic urethra.The bladder was emptied and reinspected and no tumors, clots, or other mucosal pathology was identified.  Reinspection of the bladder neck revealed the previously seen bleeding sites in the control with adequate hemostasis.  The anteriorly located prostatic urethral leading sites were then controlled with electrocautery and it was decided to remove the resectoscope and not place a catheter as it was felt that the catheter might actually cause more bleeding than it would help.  I was able to apply suprapubic pressure under anesthesia demonstrated a clear voiding stream after the procedure.  He tolerated the procedure well and without complications.  He was able to be awakened and transferred to the recovery unit in satisfactory condition.

## 2012-08-05 NOTE — Anesthesia Preprocedure Evaluation (Deleted)
Anesthesia Evaluation Anesthesia Physical Anesthesia Plan Anesthesia Quick Evaluation  

## 2012-08-05 NOTE — Progress Notes (Addendum)
ANTIBIOTIC CONSULT NOTE - INITIAL  Pharmacy Consult for Vancomycin Indication: UTI  No Known Allergies  Patient Measurements:   Body Weight: 74 kg as of 08/01/12 IBW: 66 kg  Vital Signs: Temp: 97.8 F (36.6 C) (07/21 1702) Temp src: Oral (07/21 1235) BP: 147/56 mmHg (07/21 1702) Pulse Rate: 90 (07/21 1702) Intake/Output from previous day:   Intake/Output from this shift: Total I/O In: 500 [I.V.:500] Out: 300 [Urine:300]  Labs: No results found for this basename: WBC, HGB, PLT, LABCREA, CREATININE,  in the last 72 hours The CrCl is unknown because both a height and weight (above a minimum accepted value) are required for this calculation. No results found for this basename: VANCOTROUGH, Leodis Binet, VANCORANDOM, GENTTROUGH, GENTPEAK, GENTRANDOM, TOBRATROUGH, TOBRAPEAK, TOBRARND, AMIKACINPEAK, AMIKACINTROU, AMIKACIN,  in the last 72 hours   Microbiology: Recent Results (from the past 720 hour(s))  URINE CULTURE     Status: None   Collection Time    08/01/12  2:59 PM      Result Value Range Status   Specimen Description URINE, CATHETERIZED   Final   Special Requests NONE   Final   Culture  Setup Time 08/01/2012 21:22   Final   Colony Count >=100,000 COLONIES/ML   Final   Culture     Final   Value: STAPHYLOCOCCUS EPIDERMIDIS     Note: RIFAMPIN AND GENTAMICIN SHOULD NOT BE USED AS SINGLE DRUGS FOR TREATMENT OF STAPH INFECTIONS.   Report Status 08/04/2012 FINAL   Final   Organism ID, Bacteria STAPHYLOCOCCUS EPIDERMIDIS   Final    Medical History: Past Medical History  Diagnosis Date  . Hypertension   . Arthritis   . Dysrhythmia 06/2006    1 time years ago A-Fib.-sees Dr. Armanda Magic   . History of skin cancer   . Hematuria   . Anemia   . Hx of transfusion   . History of kidney cancer   . Cancer     HX PROSTAT AND KIDNEY CANCER     Assessment: 61 yoM presents with gross hematuria.  PMH significant for h/o radiation cystitis, urothelial carcinoma and prostate  cancer.  Post-op cytoscopy with clot evacuation.  Urine culture collected 7/17 grew staph epidermidis.  MD ordered vancomycin for post-op treatment.  Vancomycin 1g already given to patient today at 1600.  SCr elevated at 2.13 on 7/17.  CrCl~23.9 ml/min.  Goal of Therapy:  Vancomycin trough level 10-15 mcg/ml  Plan:   Vancomycin 500 mg IV q24h (first dose tomorrow afternoon).  F/u SCr in AM for dose adjustment.  Staph epidermidis urine culture is oxacillin-susceptible so could consider cefazolin for treatment.  Clance Boll 08/05/2012,5:25 PM

## 2012-08-05 NOTE — Interval H&P Note (Signed)
History and Physical Interval Note:  08/05/2012 2:04 PM  Alexander Barnett  has presented today for surgery, with the diagnosis of GROSS HEMATURIA, HEMORRHAGIC CYSTITIS  The various methods of treatment have been discussed with the patient and family. After consideration of risks, benefits and other options for treatment, the patient has consented to  Procedure(s) with comments: CYSTOSCOPY (N/A) - CYSTO, CLOT EVACUATION, FULGERATION OF BLEEDING AREAS   as a surgical intervention .  The patient's history has been reviewed, patient examined, no change in status, stable for surgery.  I have reviewed the patient's chart and labs.  Questions were answered to the patient's satisfaction.     Quintan Saldivar,LES

## 2012-08-05 NOTE — Transfer of Care (Signed)
Immediate Anesthesia Transfer of Care Note  Patient: Alexander Barnett  Procedure(s) Performed: Procedure(s) (LRB): CYSTOSCOPY (N/A)  Patient Location: PACU  Anesthesia Type: General  Level of Consciousness: sedated, patient cooperative and responds to stimulaton  Airway & Oxygen Therapy: Patient Spontanous Breathing and Patient connected to face mask oxgen  Post-op Assessment: Report given to PACU RN and Post -op Vital signs reviewed and stable  Post vital signs: Reviewed and stable  Complications: No apparent anesthesia complications.

## 2012-08-05 NOTE — Anesthesia Preprocedure Evaluation (Signed)
Anesthesia Evaluation  Patient identified by MRN, date of birth, ID band Patient awake    Reviewed: Allergy & Precautions, H&P , NPO status , Patient's Chart, lab work & pertinent test results, reviewed documented beta blocker date and time   Airway Mallampati: II TM Distance: >3 FB Neck ROM: Full    Dental  (+) Dental Advisory Given and Teeth Intact   Pulmonary neg pulmonary ROS,  breath sounds clear to auscultation        Cardiovascular hypertension, Pt. on medications and Pt. on home beta blockers + dysrhythmias Atrial Fibrillation Rhythm:Regular Rate:Normal     Neuro/Psych negative neurological ROS  negative psych ROS   GI/Hepatic negative GI ROS, Neg liver ROS,   Endo/Other  negative endocrine ROS  Renal/GU Renal disease     Musculoskeletal negative musculoskeletal ROS (+)   Abdominal   Peds  Hematology negative hematology ROS (+)   Anesthesia Other Findings   Reproductive/Obstetrics                           Anesthesia Physical Anesthesia Plan  ASA: II  Anesthesia Plan: General   Post-op Pain Management:    Induction: Intravenous  Airway Management Planned: LMA  Additional Equipment:   Intra-op Plan:   Post-operative Plan:   Informed Consent: I have reviewed the patients History and Physical, chart, labs and discussed the procedure including the risks, benefits and alternatives for the proposed anesthesia with the patient or authorized representative who has indicated his/her understanding and acceptance.   Dental advisory given  Plan Discussed with: CRNA  Anesthesia Plan Comments:         Anesthesia Quick Evaluation

## 2012-08-05 NOTE — Anesthesia Postprocedure Evaluation (Signed)
Anesthesia Post Note  Patient: Alexander Barnett  Procedure(s) Performed: Procedure(s) (LRB): CYSTOSCOPY (N/A)  Anesthesia type: General  Patient location: PACU  Post pain: Pain level controlled  Post assessment: Post-op Vital signs reviewed  Last Vitals:  Filed Vitals:   08/05/12 1702  BP: 147/56  Pulse: 90  Temp: 36.6 C  Resp: 15    Post vital signs: Reviewed  Level of consciousness: sedated  Complications: No apparent anesthesia complications

## 2012-08-06 ENCOUNTER — Encounter (HOSPITAL_COMMUNITY): Payer: Self-pay | Admitting: Urology

## 2012-08-06 LAB — CREATININE, SERUM
GFR calc Af Amer: 36 mL/min — ABNORMAL LOW (ref >90)
GFR calc non Af Amer: 31 mL/min — ABNORMAL LOW (ref >90)

## 2012-08-06 NOTE — Discharge Summary (Signed)
  Date of admission: 08/05/2012  Date of discharge: 08/06/2012  Admission diagnosis: Gross hematuria   Discharge diagnosis: Gross hematuria  Secondary diagnoses: CKD, Urothelial carcinoma of bladder, kidney, and prostatic urethra, prostate cancer s/p radiation therapy.  History and Physical: For full details, please see admission history and physical. Briefly, Alexander Barnett is a 79 y.o. year old patient with persistent gross hematuria after recent TUR of bladder neck/prostatic urethral tumor which has been persistent despite conservative management.   Hospital Course: He was taken to the OR on 08/05/12 and underwent cystoscopy and clot evacuation along with fulguration of bleeding sites at the bladder neck and prostatic urethra.  He was left without a catheter overnight and voided clear urine all night. He was felt stable for discharge home on POD # 1.  Laboratory values: No results found for this basename: HGB, HCT,  in the last 72 hours  Recent Labs  08/06/12 0513  CREATININE 1.83*    Disposition: Home  Discharge instruction: The patient was instructed to be ambulatory but told to refrain from heavy lifting, strenuous activity, or driving.  Discharge medications:    Medication List         amLODipine 5 MG tablet  Commonly known as:  NORVASC  Take 5 mg by mouth every morning.     bisoprolol 5 MG tablet  Commonly known as:  ZEBETA  Take 2.5 mg by mouth every morning. Takes 1/2     cholecalciferol 1000 UNITS tablet  Commonly known as:  VITAMIN D  Take 1,000 Units by mouth daily.     docusate sodium 100 MG capsule  Commonly known as:  COLACE  Take 100 mg by mouth daily as needed for constipation.     ferrous sulfate 325 (65 FE) MG tablet  Take 325 mg by mouth 2 (two) times daily.     finasteride 5 MG tablet  Commonly known as:  PROSCAR  Take 5 mg by mouth every morning.     HYDROcodone-acetaminophen 5-325 MG per tablet  Commonly known as:  NORCO/VICODIN  Take 1-2  tablets by mouth every 6 (six) hours as needed for pain.     multivitamin with minerals Tabs  Take 1 tablet by mouth daily.     nitrofurantoin (macrocrystal-monohydrate) 100 MG capsule  Commonly known as:  MACROBID  Take 1 capsule (100 mg total) by mouth 2 (two) times daily.     olmesartan 40 MG tablet  Commonly known as:  BENICAR  Take 40 mg by mouth every morning.     travoprost (benzalkonium) 0.004 % ophthalmic solution  Commonly known as:  TRAVATAN  Place 1 drop into both eyes at bedtime.        Followup:      Follow-up Information   Follow up with Manford Sprong,LES, MD. (08/27/12 at 12:15)    Contact information:   [address] [place] [employer] [place] [address] [phone]

## 2012-08-06 NOTE — Progress Notes (Signed)
Patient discharge to home, D/c instructions and follow up appointment done and was given to the patient, verbalized understanding. PIV removed no s/s of infiltration noted.

## 2012-08-06 NOTE — Care Management Note (Signed)
    Page 1 of 1   08/06/2012     12:50:55 PM   CARE MANAGEMENT NOTE 08/06/2012  Patient:  Alexander Barnett, Alexander Barnett   Account Number:  [id number]  Date Initiated:  08/06/2012  Documentation initiated by:  Lanier Clam  Subjective/Objective Assessment:   ADMITTED W/HEMATURIA.     Action/Plan:   FROM HOME.   Anticipated DC Date:  08/06/2012   Anticipated DC Plan:  HOME/SELF CARE      DC Planning Services  CM consult      Choice offered to / List presented to:             Status of service:  Completed, signed off Medicare Important Message given?   (If response is "NO", the following Medicare IM given date fields will be blank) Date Medicare IM given:   Date Additional Medicare IM given:    Discharge Disposition:  HOME/SELF CARE  Per UR Regulation:  Reviewed for med. necessity/level of care/duration of stay  If discussed at Long Length of Stay Meetings, dates discussed:    Comments:  08/06/12 Curlie Macken RN,BSN NCM [phone] S/P CYSTOSCOPY.D/C HOME NO NEEDS OR ORDERS.

## 2012-08-06 NOTE — Progress Notes (Signed)
Patient ID: Alexander Barnett, male   DOB: [birth year], 79 y.o.   MRN: [id number]  1 Day Post-Op Subjective: Pt has been voiding clear urine grossly since surgery yesterday.  Objective: Vital signs in last 24 hours: Temp:  [97.7 F (36.5 C)-98.2 F (36.8 C)] 98.2 F (36.8 C) (07/22 0539) Pulse Rate:  [36-90] 65 (07/22 0539) Resp:  [12-18] 16 (07/22 0539) BP: (108-149)/(54-94) 124/60 mmHg (07/22 0539) SpO2:  [93 %-100 %] 96 % (07/22 0539) Weight:  [73.9 kg (162 lb 14.7 oz)] 73.9 kg (162 lb 14.7 oz) (07/21 1630)  Intake/Output from previous day: 07/21 0701 - 07/22 0700 In: 811.3 [P.O.:120; I.V.:691.3] Out: 1450 [Urine:1450] Intake/Output this shift:    Physical Exam:  General: Alert and oriented Abdomen: Soft, ND   Lab Results: No results found for this basename: HGB, HCT,  in the last 72 hours BMET  Recent Labs  08/06/12 0513  CREATININE 1.83*     Studies/Results: No results found.  Assessment/Plan: - D/C home   LOS: 1 day   Fabiha Rougeau,LES 08/06/2012, 7:34 AM

## 2012-09-25 ENCOUNTER — Other Ambulatory Visit: Payer: Self-pay | Admitting: Dermatology

## 2012-12-04 ENCOUNTER — Encounter: Payer: Self-pay | Admitting: Cardiology

## 2012-12-04 DIAGNOSIS — Z9289 Personal history of other medical treatment: Secondary | ICD-10-CM | POA: Insufficient documentation

## 2012-12-04 DIAGNOSIS — M1990 Unspecified osteoarthritis, unspecified site: Secondary | ICD-10-CM | POA: Insufficient documentation

## 2012-12-04 DIAGNOSIS — D649 Anemia, unspecified: Secondary | ICD-10-CM | POA: Insufficient documentation

## 2012-12-04 DIAGNOSIS — R319 Hematuria, unspecified: Secondary | ICD-10-CM | POA: Insufficient documentation

## 2012-12-04 DIAGNOSIS — Z85528 Personal history of other malignant neoplasm of kidney: Secondary | ICD-10-CM | POA: Insufficient documentation

## 2012-12-04 DIAGNOSIS — I10 Essential (primary) hypertension: Secondary | ICD-10-CM | POA: Insufficient documentation

## 2012-12-04 DIAGNOSIS — Z85828 Personal history of other malignant neoplasm of skin: Secondary | ICD-10-CM | POA: Insufficient documentation

## 2012-12-05 ENCOUNTER — Ambulatory Visit (INDEPENDENT_AMBULATORY_CARE_PROVIDER_SITE_OTHER): Payer: Medicare Other | Admitting: Cardiology

## 2012-12-05 ENCOUNTER — Encounter: Payer: Self-pay | Admitting: Cardiology

## 2012-12-05 ENCOUNTER — Encounter (INDEPENDENT_AMBULATORY_CARE_PROVIDER_SITE_OTHER): Payer: Self-pay

## 2012-12-05 ENCOUNTER — Other Ambulatory Visit: Payer: Self-pay | Admitting: Cardiology

## 2012-12-05 ENCOUNTER — Encounter: Payer: Self-pay | Admitting: General Surgery

## 2012-12-05 VITALS — BP 150/64 | HR 64 | Ht 67.0 in | Wt 163.8 lb

## 2012-12-05 DIAGNOSIS — I4891 Unspecified atrial fibrillation: Secondary | ICD-10-CM

## 2012-12-05 DIAGNOSIS — I5189 Other ill-defined heart diseases: Secondary | ICD-10-CM

## 2012-12-05 DIAGNOSIS — I10 Essential (primary) hypertension: Secondary | ICD-10-CM

## 2012-12-05 DIAGNOSIS — I519 Heart disease, unspecified: Secondary | ICD-10-CM

## 2012-12-05 DIAGNOSIS — I359 Nonrheumatic aortic valve disorder, unspecified: Secondary | ICD-10-CM

## 2012-12-05 DIAGNOSIS — I498 Other specified cardiac arrhythmias: Secondary | ICD-10-CM

## 2012-12-05 DIAGNOSIS — R001 Bradycardia, unspecified: Secondary | ICD-10-CM

## 2012-12-05 DIAGNOSIS — I480 Paroxysmal atrial fibrillation: Secondary | ICD-10-CM

## 2012-12-05 DIAGNOSIS — I350 Nonrheumatic aortic (valve) stenosis: Secondary | ICD-10-CM

## 2012-12-05 MED ORDER — ATENOLOL 25 MG PO TABS: 25.0000 mg | ORAL_TABLET | Freq: Every day | ORAL | Status: DC

## 2012-12-05 NOTE — Patient Instructions (Addendum)
Your physician has recommended you make the following change in your medication: Stop Bisoprolol and Start Atenolol 25 MG Daily  Your physician recommends that you continue on your current medications as directed. Please refer to the Current Medication list given to you today.  Your physician has requested that you have an echocardiogram. Echocardiography is a painless test that uses sound waves to create images of your heart. It provides your doctor with information about the size and shape of your heart and how well your heart's chambers and valves are working. This procedure takes approximately one hour. There are no restrictions for this procedure.  Your physician wants you to follow-up in: 1 year with Dr Sherlyn Lick will receive a reminder letter in the mail two months in advance. If you don't receive a letter, please call our office to schedule the follow-up appointment.

## 2012-12-05 NOTE — Progress Notes (Addendum)
[address] [place], [place]  [address] Phone: [phone] Fax:  [phone]  Date:  12/05/2012   ID:  Alexander Barnett, DOB 05-17-1920, MRN [id number]  PCP:  Johnn Hai, MD  Cardiologist:  Armanda Magic, MD     History of Present Illness: Alexander Barnett is a 79 y.o. male with a history of PAF, asymptomatic bradycardia and HTN who presents today for followup.  He is doing well.  He denies any chest pain, SOB, DOE, LE edema, dizziness, palpitations or syncope.   Wt Readings from Last 3 Encounters:  12/05/12 163 lb 12.8 oz (74.299 kg)  08/05/12 162 lb 14.7 oz (73.9 kg)  08/05/12 162 lb 14.7 oz (73.9 kg)     Past Medical History  Diagnosis Date  . Arthritis   . History of skin cancer   . Hematuria   . Anemia   . Hx of transfusion   . History of kidney cancer   . Hypertension   . Diabetes mellitus without complication     prediabetes diet controlled  . PAF (paroxysmal atrial fibrillation) 06/2006    refused anticoagulation  . Bradycardia   . BPH (benign prostatic hyperplasia)   . Erectile dysfunction   . Diverticulosis of colon   . CKD (chronic kidney disease) stage 3, GFR 30-59 ml/min   . Aortic stenosis, mild   . Prostate cancer   . 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 Diastolic dysfunction     Current Outpatient Prescriptions  Medication Sig Dispense Refill  . amLODipine (NORVASC) 5 MG tablet Take 5 mg by mouth every morning.      . cholecalciferol (VITAMIN D) 1000 UNITS tablet Take 1,000 Units by mouth daily.      . Coenzyme Q10 (CO Q-10) 100 MG CAPS Take by mouth.      . docusate sodium (COLACE) 100 MG capsule Take 100 mg by mouth daily as needed for constipation.      . finasteride (PROSCAR) 5 MG tablet Take 5 mg by mouth every morning.       . Multiple Vitamin (MULTIVITAMIN WITH MINERALS) TABS Take 1 tablet by mouth daily.      . travoprost, benzalkonium, (TRAVATAN) 0.004 % ophthalmic solution Place 1 drop into both  eyes at bedtime.      Marland Kitchen atenolol (TENORMIN) 25 MG tablet Take 1 tablet (25 mg total) by mouth daily.  30 tablet  11  . BENICAR 40 MG tablet TAKE 1 TABLET BY MOUTH ONCE DAILY  90 tablet  2   No current facility-administered medications for this visit.    Allergies:   No Known Allergies  Social History:  The patient  reports that he quit smoking about 50 years ago. His smoking use included Cigarettes. He has a 30 pack-year smoking history. He does not have any smokeless tobacco history on file. He reports that he drinks about 0.6 ounces of alcohol per week. He reports that he does not use illicit drugs.   Family History:  The patient's family history is not on file.   ROS:  Please see the history of present illness.      All other systems reviewed and negative.   PHYSICAL EXAM: VS:  BP 150/64  Pulse 64  Ht 5\' 7"  (1.702 m)  Wt 163 lb 12.8 oz (74.299 kg)  BMI 25.65 kg/m2 Well nourished, well developed, in no acute distress HEENT: normal Neck: no JVD Cardiac:  normal S1, S2; RRR;  2/6 SM at the RUSB radiating to the LLSB Lungs:  clear to auscultation bilaterally, no wheezing, rhonchi or rales Abd: soft, nontender, no hepatomegaly Ext: no edema Skin: warm and dry Neuro:  CNs 2-12 intact, no focal abnormalities noted  EKG: NSR with no ST changes    ASSESSMENT AND PLAN:  1. PAF maintaining NSR with no reoccurrence.  - continue Bisoprolol until he runs out then we will change to Atenolol 25mg  daily due to insurance coverage  2. HTN - mildly elevated BP today  - continue Benicar/amlodipine 3.   Diastolic Dysfunction 4.   Mild AS - murmur appears louder today  - check 2D echo to reassess AS  Followup with me in 1 year  Signed, Armanda Magic, MD 12/05/2012 9:52 PM

## 2012-12-20 ENCOUNTER — Ambulatory Visit (HOSPITAL_COMMUNITY): Payer: Medicare Other | Attending: Cardiology | Admitting: Radiology

## 2012-12-20 ENCOUNTER — Telehealth: Payer: Self-pay

## 2012-12-20 DIAGNOSIS — R011 Cardiac murmur, unspecified: Secondary | ICD-10-CM

## 2012-12-20 DIAGNOSIS — I10 Essential (primary) hypertension: Secondary | ICD-10-CM | POA: Insufficient documentation

## 2012-12-20 DIAGNOSIS — I059 Rheumatic mitral valve disease, unspecified: Secondary | ICD-10-CM | POA: Insufficient documentation

## 2012-12-20 DIAGNOSIS — I379 Nonrheumatic pulmonary valve disorder, unspecified: Secondary | ICD-10-CM | POA: Insufficient documentation

## 2012-12-20 DIAGNOSIS — I498 Other specified cardiac arrhythmias: Secondary | ICD-10-CM | POA: Insufficient documentation

## 2012-12-20 DIAGNOSIS — I480 Paroxysmal atrial fibrillation: Secondary | ICD-10-CM

## 2012-12-20 DIAGNOSIS — I359 Nonrheumatic aortic valve disorder, unspecified: Secondary | ICD-10-CM | POA: Insufficient documentation

## 2012-12-20 DIAGNOSIS — I350 Nonrheumatic aortic (valve) stenosis: Secondary | ICD-10-CM

## 2012-12-20 DIAGNOSIS — I4891 Unspecified atrial fibrillation: Secondary | ICD-10-CM | POA: Insufficient documentation

## 2012-12-20 DIAGNOSIS — Z87891 Personal history of nicotine dependence: Secondary | ICD-10-CM | POA: Insufficient documentation

## 2012-12-20 DIAGNOSIS — E785 Hyperlipidemia, unspecified: Secondary | ICD-10-CM | POA: Insufficient documentation

## 2012-12-20 NOTE — Progress Notes (Signed)
Echocardiogram performed.  

## 2012-12-20 NOTE — Telephone Encounter (Signed)
Samples

## 2012-12-25 ENCOUNTER — Other Ambulatory Visit: Payer: Self-pay | Admitting: General Surgery

## 2012-12-25 DIAGNOSIS — I350 Nonrheumatic aortic (valve) stenosis: Secondary | ICD-10-CM

## 2012-12-28 ENCOUNTER — Other Ambulatory Visit: Payer: Self-pay | Admitting: Cardiology

## 2013-02-21 ENCOUNTER — Other Ambulatory Visit: Payer: Self-pay

## 2013-02-21 MED ORDER — ATENOLOL 25 MG PO TABS: 25.0000 mg | ORAL_TABLET | Freq: Every day | ORAL | Status: DC

## 2013-03-22 ENCOUNTER — Encounter: Payer: Self-pay | Admitting: *Deleted

## 2013-08-18 ENCOUNTER — Other Ambulatory Visit: Payer: Self-pay | Admitting: Dermatology

## 2013-08-29 ENCOUNTER — Other Ambulatory Visit: Payer: Self-pay

## 2013-08-29 MED ORDER — OLMESARTAN MEDOXOMIL 40 MG PO TABS: ORAL_TABLET | ORAL | Status: DC

## 2013-09-24 ENCOUNTER — Other Ambulatory Visit: Payer: Self-pay

## 2013-09-24 MED ORDER — AMLODIPINE BESYLATE 5 MG PO TABS: ORAL_TABLET | ORAL | Status: DC

## 2013-12-05 ENCOUNTER — Encounter: Payer: Self-pay | Admitting: Cardiology

## 2013-12-05 ENCOUNTER — Ambulatory Visit (INDEPENDENT_AMBULATORY_CARE_PROVIDER_SITE_OTHER): Payer: Medicare Other | Admitting: Cardiology

## 2013-12-05 VITALS — BP 156/64 | HR 64 | Ht 67.0 in | Wt 161.0 lb

## 2013-12-05 DIAGNOSIS — I5189 Other ill-defined heart diseases: Secondary | ICD-10-CM

## 2013-12-05 DIAGNOSIS — I480 Paroxysmal atrial fibrillation: Secondary | ICD-10-CM

## 2013-12-05 DIAGNOSIS — I519 Heart disease, unspecified: Secondary | ICD-10-CM

## 2013-12-05 DIAGNOSIS — N183 Chronic kidney disease, stage 3 unspecified: Secondary | ICD-10-CM

## 2013-12-05 DIAGNOSIS — I10 Essential (primary) hypertension: Secondary | ICD-10-CM

## 2013-12-05 DIAGNOSIS — I350 Nonrheumatic aortic (valve) stenosis: Secondary | ICD-10-CM

## 2013-12-05 LAB — BASIC METABOLIC PANEL
BUN: 25 mg/dL — AB (ref 6–23)
CALCIUM: 8.9 mg/dL (ref 8.4–10.5)
CO2: 24 meq/L (ref 19–32)
Chloride: 106 mEq/L (ref 96–112)
Creatinine, Ser: 1.6 mg/dL — ABNORMAL HIGH (ref 0.4–1.5)
GFR: 42.64 mL/min — AB (ref >60.00)
GLUCOSE: 98 mg/dL (ref 70–99)
POTASSIUM: 4.4 meq/L (ref 3.5–5.1)
Sodium: 138 mEq/L (ref 135–145)

## 2013-12-05 NOTE — Progress Notes (Signed)
[facility], [place], [place]  [address] Phone: [phone] Fax:  [phone]  Date:  12/05/2013   ID:  Alexander Barnett, DOB 24-Nov-1920, MRN [id number]  PCP:  Alessandra Grout, MD  Cardiologist:  Fransico Him, MD    History of Present Illness: Alexander Barnett is a 80 y.o. male with a history of PAF, asymptomatic bradycardia,mild AS and HTN who presents today for followup. He is doing well. He denies any chest pain, SOB, DOE, LE edema, dizziness, palpitations or syncope.   Wt Readings from Last 3 Encounters:  12/05/13 161 lb (73.029 kg)  12/05/12 163 lb 12.8 oz (74.299 kg)  08/05/12 162 lb 14.7 oz (73.9 kg)     Past Medical History  Diagnosis Date  . Arthritis   . History of skin cancer   . Hematuria   . Anemia   . Hx of transfusion   . History of kidney cancer   . Hypertension   . Diabetes mellitus without complication     prediabetes diet controlled  . PAF (paroxysmal atrial fibrillation) 06/2006    refused anticoagulation  . Bradycardia   . BPH (benign prostatic hyperplasia)   . Erectile dysfunction   . Diverticulosis of colon   . CKD (chronic kidney disease) stage 3, GFR 30-59 ml/min   . Aortic stenosis, mild   . Prostate cancer   . 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 Diastolic dysfunction     Current Outpatient Prescriptions  Medication Sig Dispense Refill  . amLODipine (NORVASC) 5 MG tablet TAKE 1 TABLET EVERY DAY 90 tablet 0  . atenolol (TENORMIN) 25 MG tablet Take 1 tablet (25 mg total) by mouth daily. 30 tablet 1  . cholecalciferol (VITAMIN D) 1000 UNITS tablet Take 1,000 Units by mouth daily.    . Coenzyme Q10 (CO Q-10) 100 MG CAPS Take by mouth.    . finasteride (PROSCAR) 5 MG tablet Take 5 mg by mouth every morning.     . Multiple Vitamin (MULTIVITAMIN WITH MINERALS) TABS Take 1 tablet by mouth daily.    Marland Kitchen olmesartan (BENICAR) 40 MG tablet TAKE 1 TABLET BY MOUTH ONCE DAILY 90 tablet 0  . travoprost, benzalkonium,  (TRAVATAN) 0.004 % ophthalmic solution Place 1 drop into both eyes at bedtime.     No current facility-administered medications for this visit.    Allergies:   No Known Allergies  Social History:  The patient  reports that he quit smoking about 51 years ago. His smoking use included Cigarettes. He has a 30 pack-year smoking history. He does not have any smokeless tobacco history on file. He reports that he drinks about 0.6 oz of alcohol per week. He reports that he does not use illicit drugs.   Family History:  The patient's family history is not on file.   ROS:  Please see the history of present illness.      All other systems reviewed and negative.   PHYSICAL EXAM: VS:  BP 156/64 mmHg  Pulse 64  Ht 5\' 7"  (1.702 m)  Wt 161 lb (73.029 kg)  BMI 25.21 kg/m2 Well nourished, well developed, in no acute distress HEENT: normal Neck: no JVD Cardiac:  normal S1, S2; RRR; 2/6 SM at RUSB to LLSB Lungs:  clear to auscultation bilaterally, no wheezing, rhonchi or rales Abd: soft, nontender, no hepatomegaly Ext: no edema Skin: warm and dry Neuro:  CNs 2-12 intact, no focal abnormalities noted  EKG:  NSR with no ST changes  ASSESSMENT AND PLAN:    1.  PAF maintaining NSR with no reoccurrence. - continue Atenolol   2.  HTN - mildly elevated BP today - continue Benicar/amlodipine/atenolol  - check BMET  - I have asked him to check his BP daily for a week and call with results.   3. Diastolic Dysfunction   4. Mild to moderate AS - murmur appears louder today - check 2D echo to reassess AS    5.  CKD stage 3 - per PCP  Followup with me in 1 year  Signed, Fransico Him, MD [facility] 12/05/2013 11:16 AM

## 2013-12-05 NOTE — Patient Instructions (Signed)
Your physician recommends that you have lab work TODAY (BMET).  Your physician recommends that you continue on your current medications as directed. Please refer to the Current Medication list given to you today.  Your physician wants you to follow-up in: 1 year with Dr. Radford Pax. You will receive a reminder letter in the mail two months in advance. If you don't receive a letter, please call our office to schedule the follow-up appointment.  Please check your blood pressure daily for one week and call us with results. [phone].

## 2013-12-05 NOTE — Addendum Note (Signed)
Addended by: Harland German A on: 12/05/2013 11:35 AM   Modules accepted: Orders

## 2013-12-13 ENCOUNTER — Other Ambulatory Visit: Payer: Self-pay | Admitting: Cardiology

## 2013-12-15 ENCOUNTER — Telehealth: Payer: Self-pay | Admitting: Cardiology

## 2013-12-15 NOTE — Telephone Encounter (Signed)
To Dr. Turner for review. 

## 2013-12-15 NOTE — Telephone Encounter (Signed)
BP for the most part are controlled - continue current meds

## 2013-12-15 NOTE — Telephone Encounter (Signed)
New Msg   Patient calling to report BP readings: 11/23 133/61 pulse 64 11/24 145/74 pulse 62 11/25 135/68 pulse 59 11/26 148/66 pulse 64 11/27 134/70 pulse 62 11/28 121/64 pulse 58 11/29 143/66 pulse 57   Please contact at [phone] to discuss if need be, patient states he really doesn't need call back unless necessary.

## 2013-12-18 ENCOUNTER — Other Ambulatory Visit: Payer: Self-pay | Admitting: *Deleted

## 2013-12-18 MED ORDER — AMLODIPINE BESYLATE 5 MG PO TABS: ORAL_TABLET | ORAL | Status: DC

## 2014-01-14 ENCOUNTER — Other Ambulatory Visit: Payer: Self-pay | Admitting: *Deleted

## 2014-01-14 MED ORDER — OLMESARTAN MEDOXOMIL 40 MG PO TABS: 40.0000 mg | ORAL_TABLET | Freq: Every day | ORAL | Status: DC

## 2014-03-22 ENCOUNTER — Other Ambulatory Visit: Payer: Self-pay | Admitting: Cardiology

## 2014-03-26 ENCOUNTER — Other Ambulatory Visit: Payer: Self-pay | Admitting: Urology

## 2014-03-26 ENCOUNTER — Encounter (HOSPITAL_COMMUNITY): Payer: Self-pay

## 2014-03-26 ENCOUNTER — Inpatient Hospital Stay (HOSPITAL_COMMUNITY)
Admission: AD | Admit: 2014-03-26 | Discharge: 2014-04-01 | DRG: 669 | Disposition: A | Discharge status: Home / Self Care | Payer: Medicare Other | Source: Ambulatory Visit | Attending: Urology | Admitting: Urology

## 2014-03-26 DIAGNOSIS — Z8546 Personal history of malignant neoplasm of prostate: Secondary | ICD-10-CM

## 2014-03-26 DIAGNOSIS — D62 Acute posthemorrhagic anemia: Secondary | ICD-10-CM | POA: Diagnosis present

## 2014-03-26 DIAGNOSIS — R339 Retention of urine, unspecified: Secondary | ICD-10-CM | POA: Diagnosis present

## 2014-03-26 DIAGNOSIS — Z85528 Personal history of other malignant neoplasm of kidney: Secondary | ICD-10-CM | POA: Diagnosis not present

## 2014-03-26 DIAGNOSIS — R310 Gross hematuria: Secondary | ICD-10-CM | POA: Diagnosis present

## 2014-03-26 DIAGNOSIS — I129 Hypertensive chronic kidney disease with stage 1 through stage 4 chronic kidney disease, or unspecified chronic kidney disease: Secondary | ICD-10-CM | POA: Diagnosis present

## 2014-03-26 DIAGNOSIS — Z9842 Cataract extraction status, left eye: Secondary | ICD-10-CM | POA: Diagnosis not present

## 2014-03-26 DIAGNOSIS — Z923 Personal history of irradiation: Secondary | ICD-10-CM

## 2014-03-26 DIAGNOSIS — I480 Paroxysmal atrial fibrillation: Secondary | ICD-10-CM | POA: Diagnosis present

## 2014-03-26 DIAGNOSIS — Z961 Presence of intraocular lens: Secondary | ICD-10-CM | POA: Diagnosis present

## 2014-03-26 DIAGNOSIS — N320 Bladder-neck obstruction: Secondary | ICD-10-CM | POA: Diagnosis present

## 2014-03-26 DIAGNOSIS — N183 Chronic kidney disease, stage 3 (moderate): Secondary | ICD-10-CM | POA: Diagnosis present

## 2014-03-26 DIAGNOSIS — M1990 Unspecified osteoarthritis, unspecified site: Secondary | ICD-10-CM | POA: Diagnosis present

## 2014-03-26 DIAGNOSIS — C679 Malignant neoplasm of bladder, unspecified: Principal | ICD-10-CM | POA: Diagnosis present

## 2014-03-26 DIAGNOSIS — Z9841 Cataract extraction status, right eye: Secondary | ICD-10-CM | POA: Diagnosis not present

## 2014-03-26 DIAGNOSIS — N365 Urethral false passage: Secondary | ICD-10-CM | POA: Diagnosis present

## 2014-03-26 DIAGNOSIS — Z87891 Personal history of nicotine dependence: Secondary | ICD-10-CM | POA: Diagnosis not present

## 2014-03-26 DIAGNOSIS — Z79899 Other long term (current) drug therapy: Secondary | ICD-10-CM

## 2014-03-26 DIAGNOSIS — E119 Type 2 diabetes mellitus without complications: Secondary | ICD-10-CM | POA: Diagnosis present

## 2014-03-26 DIAGNOSIS — N400 Enlarged prostate without lower urinary tract symptoms: Secondary | ICD-10-CM | POA: Diagnosis present

## 2014-03-26 LAB — BASIC METABOLIC PANEL
Anion gap: 7 (ref 5–15)
BUN: 26 mg/dL — AB (ref 6–23)
CO2: 26 mmol/L (ref 19–32)
CREATININE: 1.43 mg/dL — AB (ref 0.50–1.35)
Calcium: 8.9 mg/dL (ref 8.4–10.5)
Chloride: 107 mmol/L (ref 96–112)
GFR calc Af Amer: 48 mL/min — ABNORMAL LOW (ref >90)
GFR calc non Af Amer: 41 mL/min — ABNORMAL LOW (ref >90)
GLUCOSE: 105 mg/dL — AB (ref 70–99)
Potassium: 4.2 mmol/L (ref 3.5–5.1)
SODIUM: 140 mmol/L (ref 135–145)

## 2014-03-26 LAB — HEMOGLOBIN AND HEMATOCRIT, BLOOD
HCT: 34 % — ABNORMAL LOW (ref 39.0–52.0)
HEMOGLOBIN: 11.3 g/dL — AB (ref 13.0–17.0)

## 2014-03-26 MED ORDER — DOCUSATE SODIUM 100 MG PO CAPS
100.0000 mg | ORAL_CAPSULE | Freq: Two times a day (BID) | ORAL | Status: DC
  Administered 2014-03-26 – 2014-03-31 (×6): 100 mg via ORAL
  Filled 2014-03-26 (×9): qty 1

## 2014-03-26 MED ORDER — ZOLPIDEM TARTRATE 5 MG PO TABS
5.0000 mg | ORAL_TABLET | Freq: Every evening | ORAL | Status: DC | PRN
  Administered 2014-03-26 – 2014-03-29 (×4): 5 mg via ORAL
  Filled 2014-03-26 (×4): qty 1

## 2014-03-26 MED ORDER — DEXTROSE-NACL 5-0.45 % IV SOLN
INTRAVENOUS | Status: DC
  Administered 2014-03-26 – 2014-03-31 (×6): via INTRAVENOUS

## 2014-03-26 MED ORDER — ONDANSETRON HCL 4 MG/2ML IJ SOLN: 4.0000 mg | INTRAMUSCULAR | Status: DC | PRN

## 2014-03-26 MED ORDER — OXYBUTYNIN CHLORIDE 5 MG PO TABS
5.0000 mg | ORAL_TABLET | Freq: Three times a day (TID) | ORAL | Status: DC | PRN
  Administered 2014-03-30: 5 mg via ORAL
  Filled 2014-03-26: qty 1

## 2014-03-26 NOTE — Progress Notes (Signed)
Foley cath irrigated per MD orders

## 2014-03-26 NOTE — Progress Notes (Signed)
IS given to P\pt. Instruction given in how to use it. Verbalizes understanding

## 2014-03-26 NOTE — H&P (Signed)
Urology History and Physical Exam  CC: Blood in urine  HPI: This 81 year old male with a history of adenocarcinoma of the prostate, status post radiotherapy, with history of recurrent radiation-induced hemorrhagic cystitis comes in today with recent onset this morning of gross painless hematuria. The patient has had significant amount of blood out of his urethra and in his urine for the past hour or 2. Because of past episodes similar in nature, he called for evaluation/office visit. He is having no fever, chills, nausea, vomiting, dysuria, back or abdominal pain. He is not on aspirin. Up until this morning he was in his usual state of good health.   He was seen in the office, attempt was made for placement of 24 fr hematuria catheter. 20 fr Councill was eventually placed. Because of bleeding and pt's history and age, he is admitted for hematuria management.   PMH: Past Medical History  Diagnosis Date  . Arthritis   . History of skin cancer   . Hematuria   . Anemia   . Hx of transfusion   . History of kidney cancer   . Hypertension   . Diabetes mellitus without complication     prediabetes diet controlled  . PAF (paroxysmal atrial fibrillation) 06/2006    refused anticoagulation  . Bradycardia   . BPH (benign prostatic hyperplasia)   . Erectile dysfunction   . Diverticulosis of colon   . CKD (chronic kidney disease) stage 3, GFR 30-59 ml/min   . Aortic stenosis, mild   . Prostate cancer   . 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 Diastolic dysfunction     PSH: Past Surgical History  Procedure Laterality Date  . Tonsillectomy      as child  . Hernia repair  05/2002    right  . Eye surgery  02/2004    bil.cataract w/ lens implant  . Mohs surgery  02/2002    nose  . Nephrectomy  03/2007    left  . Prostate surgery  12/2003  . [place]  04/1998    to prostate  . Imrt radiation  04/2006    for prostate cancer  . Prostate surgery  03/1998   surgery in prostate area  . Urinary surgery  03/1998    bleeding  . Hyperbaric treatments      40 treatments for prostate-to try to stop bleeding  . Transurethral resection of prostate N/A 06/24/2012    Procedure: TRANSURETHRAL RESECTION OF THE PROSTATE (TURP);  Surgeon: Dutch Gray, MD;  Location: [facility];  Service: Urology;  Laterality: N/A;  CYSTOSCOPY, TRANSURETHRAL RESECTION OF PROSTATIC URETHRAL TUMOR   . Cystoscopy N/A 06/24/2012    Procedure: CYSTOSCOPY;  Surgeon: Dutch Gray, MD;  Location: [facility];  Service: Urology;  Laterality: N/A;  . Cystoscopy N/A 08/05/2012    Procedure: CYSTOSCOPY;  Surgeon: Dutch Gray, MD;  Location: [facility];  Service: Urology;  Laterality: N/A;  CYSTO, CLOT EVACUATION, FULGERATION OF BLEEDING AREAS AT BLADDER NECK AND PROSTATIC URETHRA      Allergies: No Known Allergies  Medications: Prescriptions prior to admission  Medication Sig Dispense Refill Last Dose  . amLODipine (NORVASC) 5 MG tablet TAKE 1 TABLET EVERY DAY 90 tablet 3 03/26/2014 at Unknown time  . atenolol (TENORMIN) 25 MG tablet TAKE 1 TABLET (25 MG TOTAL) BY MOUTH DAILY. 90 tablet 2 03/26/2014 at 0830  . cholecalciferol (VITAMIN D) 1000 UNITS tablet Take 1,000 Units by mouth daily.   03/25/2014 at Unknown  time  . Coenzyme Q10 (CO Q-10) 100 MG CAPS Take 1 capsule by mouth daily.    03/25/2014 at Unknown time  . finasteride (PROSCAR) 5 MG tablet Take 5 mg by mouth every morning.    03/26/2014 at Unknown time  . Multiple Vitamin (MULTIVITAMIN WITH MINERALS) TABS Take 1 tablet by mouth daily.   Past Week at Unknown time  . olmesartan (BENICAR) 40 MG tablet Take 1 tablet (40 mg total) by mouth daily. 90 tablet 3 03/26/2014 at Unknown time  . travoprost, benzalkonium, (TRAVATAN) 0.004 % ophthalmic solution Place 1 drop into both eyes at bedtime.   03/25/2014 at Unknown time     Social History: History   Social History  . Marital Status: Widowed    Spouse Name: N/A  . Number of Children: N/A  . Years of  Education: N/A   Occupational History  . Not on file.   Social History Main Topics  . Smoking status: Former Smoker -- 1.50 packs/day for 20 years    Types: Cigarettes    Quit date: 06/21/1962  . Smokeless tobacco: Not on file  . Alcohol Use: 0.6 oz/week    1 Glasses of wine per week     Comment: glass wine  occassionally  . Drug Use: No  . Sexual Activity: No   Other Topics Concern  . Not on file   Social History Narrative    Family History: History reviewed. No pertinent family history.  Review of Systems: Positive: blood in urine Negative: A further 10 point review of systems was negative except what is listed in the HPI.                  Physical Exam: @VITALS2 @ General: No acute distress.  Awake. Head:  Normocephalic.  Atraumatic. ENT:  EOMI.  Mucous membranes moist Neck:  Supple.  No lymphadenopathy. CV:  S1 present. S2 present. Regular rate. Pulmonary: Equal effort bilaterally.  Clear to auscultation bilaterally. Abdomen: Soft.  Non- tender to palpation. Skin:  Normal turgor.  No visible rash. Extremity: No gross deformity of bilateral upper extremities.  No gross deformity of                             lower extremities. Neurologic: Alert. Appropriate mood.  Penis:  Circumcised.  No lesions. Urethra: Orthotopic meatus. Blood is present at meatus. Scrotum: No lesions.  No ecchymosis.  No erythema. Testicles: Descended bilaterally.  Atrophic. Epididymis: Palpable bilaterally. Nontender to palpation.  Studies:  Recent Labs     03/26/14  1515  HGB  11.3*    Recent Labs     03/26/14  1515  NA  140  K  4.2  CL  107  CO2  26  BUN  26*  CREATININE  1.43*  CALCIUM  8.9  GFRNONAA  41*  GFRAA  48*     No results for input(s): INR, APTT in the last 72 hours.  Invalid input(s): PT   Invalid input(s): ABG    Assessment:  Recurrent radiation-induced hemorrhagic cystitis  Plan: The patient is admitted for management of recurrent hematuria.  Currently, he has a 49 [place] council tip catheter. We'll do hand irrigations. It was difficult to place a three-way irrigation catheter because of a bladder neck contracture. Hopefully, hand irrigation with a 2 way catheter will suffice for the present time with limiting his bleeding.

## 2014-03-27 LAB — HEMOGLOBIN AND HEMATOCRIT, BLOOD
HCT: 30.9 % — ABNORMAL LOW (ref 39.0–52.0)
HEMOGLOBIN: 10.3 g/dL — AB (ref 13.0–17.0)

## 2014-03-27 NOTE — Progress Notes (Signed)
Patient ID: Alexander Barnett, male   DOB: 1920/07/19, 81 y.o.   MRN: [id number]   Pt with no new complaints tonight.  I hand irrigated his catheter again with sterile water.  I removed multiple small clot until he was irrigating mostly clear to light pink.  I placed his catheter on traction.  - Will continue catheter on traction overnight.  I will make him NPO after MN in case he should need to go to the OR for cysto/fulguration tomorrow.  I have discussed with Dr. Louis Meckel who will be seeing him tomorrow.  Hgb pending for the morning as well.

## 2014-03-27 NOTE — Progress Notes (Signed)
Patient ID: Alexander Barnett, male   DOB: Apr 23, 1920, 81 y.o.   MRN: [id number]    Subjective: Pt with persistent gross hematuria.  A few clots continue to be irrigated from bladder about every few hours.  He has a 20 Fr catheter in right now as a larger 3 way catheter could not be placed due to bladder neck stenosis.  Objective: Vital signs in last 24 hours: Temp:  [98.2 F (36.8 C)-98.7 F (37.1 C)] 98.7 F (37.1 C) (03/11 0536) Pulse Rate:  [68-105] 68 (03/11 0536) Resp:  [18-20] 18 (03/11 0536) BP: (146-156)/(61-74) 150/62 mmHg (03/11 0536) SpO2:  [95 %-97 %] 95 % (03/11 0536) Weight:  [70.988 kg (156 lb 8 oz)] 70.988 kg (156 lb 8 oz) (03/10 1500)  Intake/Output from previous day: 03/10 0701 - 03/11 0700 In: 1615.8 [P.O.:240; I.V.:325.8] Out: 2400 [Urine:2400] Intake/Output this shift: Total I/O In: 1375.8 [I.V.:325.8; Other:1050] Out: 1700 [Urine:1700]  Physical Exam:  General: Alert and oriented GU: Urine red tinged, draining well  Lab Results:  Recent Labs  03/26/14 1515 03/27/14 0500  HGB 11.3* 10.3*  HCT 34.0* 30.9*   BMET  Recent Labs  03/26/14 1515  NA 140  K 4.2  CL 107  CO2 26  GLUCOSE 105*  BUN 26*  CREATININE 1.43*  CALCIUM 8.9     Studies/Results: No results found.  Assessment/Plan: - Follow Hgb - Continue hand irrigation - Will make further determination regarding disposition later today.   LOS: 1 day   Rayon Mcchristian,LES 03/27/2014, 6:57 AM

## 2014-03-28 LAB — HEMOGLOBIN AND HEMATOCRIT, BLOOD
HEMATOCRIT: 29.9 % — AB (ref 39.0–52.0)
Hemoglobin: 9.8 g/dL — ABNORMAL LOW (ref 13.0–17.0)

## 2014-03-28 NOTE — Progress Notes (Signed)
Patient ID: Alexander Barnett, male   DOB: 1920-10-05, 81 y.o.   MRN: [id number]    Subjective: Clearing, albeit slowly.  Small clots irrigated this AM. Patient otherwise without complaints.  He is pessimistic about the prospects of this clearing up on its own...   Objective: Vital signs in last 24 hours: Temp:  [98.4 F (36.9 C)-98.9 F (37.2 C)] 98.4 F (36.9 C) (03/12 0629) Pulse Rate:  [63-74] 63 (03/12 0629) Resp:  [20] 20 (03/12 0629) BP: (124-161)/(57-64) 161/57 mmHg (03/12 0629) SpO2:  [94 %-100 %] 94 % (03/12 0629)  Intake/Output from previous day: 03/11 0701 - 03/12 0700 In: 3894.2 [P.O.:240; I.V.:1614.2] Out: 3890 [Urine:3890] Intake/Output this shift:    Physical Exam:  General: Alert and oriented GU: Urine red tinged, draining well  Lab Results:  Recent Labs  03/26/14 1515 03/27/14 0500 03/28/14 0404  HGB 11.3* 10.3* 9.8*  HCT 34.0* 30.9* 29.9*   BMET  Recent Labs  03/26/14 1515  NA 140  K 4.2  CL 107  CO2 26  GLUCOSE 105*  BUN 26*  CREATININE 1.43*  CALCIUM 8.9     Studies/Results: No results found.  Assessment/Plan: - On-going gross hematuria - traction overnight on foley didn't appear to be particularly helpful.    Hgb stable.  It clears easily, minimal clots this AM - all small. - one more day of conservative management. - NPO p MN - will check first thing in AM tomorrow and post patient for OR if hematuria on-going.   LOS: 2 days   Ardis Hughs 03/28/2014, 10:41 AM

## 2014-03-29 ENCOUNTER — Inpatient Hospital Stay (HOSPITAL_COMMUNITY): Payer: Medicare Other | Admitting: Certified Registered Nurse Anesthetist

## 2014-03-29 ENCOUNTER — Encounter (HOSPITAL_COMMUNITY)
Admission: AD | Disposition: A | Discharge status: Home / Self Care | Payer: Self-pay | Source: Ambulatory Visit | Attending: Urology

## 2014-03-29 ENCOUNTER — Encounter (HOSPITAL_COMMUNITY): Payer: Self-pay | Admitting: Certified Registered Nurse Anesthetist

## 2014-03-29 HISTORY — PX: TRANSURETHRAL RESECTION OF BLADDER TUMOR: SHX2575

## 2014-03-29 LAB — BASIC METABOLIC PANEL
Anion gap: 5 (ref 5–15)
BUN: 11 mg/dL (ref 6–23)
CALCIUM: 8 mg/dL — AB (ref 8.4–10.5)
CO2: 25 mmol/L (ref 19–32)
CREATININE: 1.37 mg/dL — AB (ref 0.50–1.35)
Chloride: 106 mmol/L (ref 96–112)
GFR calc non Af Amer: 43 mL/min — ABNORMAL LOW (ref >90)
GFR, EST AFRICAN AMERICAN: 50 mL/min — AB (ref >90)
Glucose, Bld: 108 mg/dL — ABNORMAL HIGH (ref 70–99)
POTASSIUM: 3.5 mmol/L (ref 3.5–5.1)
Sodium: 136 mmol/L (ref 135–145)

## 2014-03-29 LAB — SURGICAL PCR SCREEN
MRSA, PCR: NEGATIVE
Staphylococcus aureus: NEGATIVE

## 2014-03-29 LAB — TYPE AND SCREEN
ABO/RH(D): O POS
Antibody Screen: NEGATIVE

## 2014-03-29 LAB — CBC
HCT: 27 % — ABNORMAL LOW (ref 39.0–52.0)
HEMOGLOBIN: 9 g/dL — AB (ref 13.0–17.0)
MCH: 30.2 pg (ref 26.0–34.0)
MCHC: 33.3 g/dL (ref 30.0–36.0)
MCV: 90.6 fL (ref 78.0–100.0)
PLATELETS: 128 10*3/uL — AB (ref 150–400)
RBC: 2.98 MIL/uL — ABNORMAL LOW (ref 4.22–5.81)
RDW: 17.5 % — ABNORMAL HIGH (ref 11.5–15.5)
WBC: 8.1 10*3/uL (ref 4.0–10.5)

## 2014-03-29 SURGERY — TURBT (TRANSURETHRAL RESECTION OF BLADDER TUMOR)
Anesthesia: General | Site: Bladder

## 2014-03-29 MED ORDER — HEPARIN SODIUM (PORCINE) 5000 UNIT/ML IJ SOLN
5000.0000 [IU] | Freq: Three times a day (TID) | INTRAMUSCULAR | Status: DC
  Administered 2014-03-29 – 2014-03-30 (×4): 5000 [IU] via SUBCUTANEOUS
  Filled 2014-03-29 (×4): qty 1

## 2014-03-29 MED ORDER — FENTANYL CITRATE 0.05 MG/ML IJ SOLN
25.0000 ug | INTRAMUSCULAR | Status: DC | PRN
  Administered 2014-03-29 (×2): 50 ug via INTRAVENOUS

## 2014-03-29 MED ORDER — 0.9 % SODIUM CHLORIDE (POUR BTL) OPTIME
TOPICAL | Status: DC | PRN
  Administered 2014-03-29: 1000 mL

## 2014-03-29 MED ORDER — METHYLENE BLUE 1 % INJ SOLN
INTRAMUSCULAR | Status: DC | PRN
  Administered 2014-03-29: 10 mL via INTRAVENOUS

## 2014-03-29 MED ORDER — FENTANYL CITRATE 0.05 MG/ML IJ SOLN
INTRAMUSCULAR | Status: AC
  Filled 2014-03-29: qty 2

## 2014-03-29 MED ORDER — ONDANSETRON HCL 4 MG/2ML IJ SOLN
INTRAMUSCULAR | Status: DC | PRN
Start: 2014-03-29 — End: 2014-03-29
  Administered 2014-03-29: 4 mg via INTRAVENOUS

## 2014-03-29 MED ORDER — LIDOCAINE HCL (CARDIAC) 20 MG/ML IV SOLN
INTRAVENOUS | Status: AC
  Filled 2014-03-29: qty 5

## 2014-03-29 MED ORDER — BELLADONNA ALKALOIDS-OPIUM 16.2-60 MG RE SUPP
RECTAL | Status: DC | PRN
  Administered 2014-03-29: 1 via RECTAL

## 2014-03-29 MED ORDER — BELLADONNA ALKALOIDS-OPIUM 16.2-60 MG RE SUPP: 1.0000 | Freq: Three times a day (TID) | RECTAL | Status: DC | PRN

## 2014-03-29 MED ORDER — ONDANSETRON HCL 4 MG/2ML IJ SOLN
INTRAMUSCULAR | Status: AC
  Filled 2014-03-29: qty 2

## 2014-03-29 MED ORDER — HYDROMORPHONE HCL 1 MG/ML IJ SOLN
INTRAMUSCULAR | Status: AC
  Filled 2014-03-29: qty 1

## 2014-03-29 MED ORDER — LACTATED RINGERS IV SOLN
INTRAVENOUS | Status: DC | PRN
  Administered 2014-03-29: 12:00:00 via INTRAVENOUS

## 2014-03-29 MED ORDER — IOHEXOL 300 MG/ML  SOLN
INTRAMUSCULAR | Status: DC | PRN
  Administered 2014-03-29: 10 mL

## 2014-03-29 MED ORDER — LIDOCAINE HCL (CARDIAC) 20 MG/ML IV SOLN
INTRAVENOUS | Status: DC | PRN
  Administered 2014-03-29: 100 mg via INTRAVENOUS

## 2014-03-29 MED ORDER — STERILE WATER FOR IRRIGATION IR SOLN
Status: DC | PRN
  Administered 2014-03-29 (×2): 3000 mL

## 2014-03-29 MED ORDER — PANTOPRAZOLE SODIUM 40 MG IV SOLR
40.0000 mg | Freq: Every day | INTRAVENOUS | Status: DC
  Administered 2014-03-29 – 2014-03-31 (×3): 40 mg via INTRAVENOUS
  Filled 2014-03-29 (×4): qty 40

## 2014-03-29 MED ORDER — PROPOFOL 10 MG/ML IV BOLUS
INTRAVENOUS | Status: DC | PRN
  Administered 2014-03-29: 200 mg via INTRAVENOUS

## 2014-03-29 MED ORDER — METHYLENE BLUE 1 % INJ SOLN
INTRAMUSCULAR | Status: AC
  Filled 2014-03-29: qty 10

## 2014-03-29 MED ORDER — AMLODIPINE BESYLATE 5 MG PO TABS
5.0000 mg | ORAL_TABLET | Freq: Every day | ORAL | Status: DC
  Administered 2014-03-29 – 2014-03-30 (×2): 5 mg via ORAL
  Filled 2014-03-29 (×2): qty 1

## 2014-03-29 MED ORDER — HYDROMORPHONE HCL 1 MG/ML IJ SOLN
0.2500 mg | INTRAMUSCULAR | Status: DC | PRN
  Administered 2014-03-29 (×3): 0.5 mg via INTRAVENOUS

## 2014-03-29 MED ORDER — FINASTERIDE 5 MG PO TABS
5.0000 mg | ORAL_TABLET | Freq: Every morning | ORAL | Status: DC
  Administered 2014-03-30 – 2014-03-31 (×2): 5 mg via ORAL
  Filled 2014-03-29 (×3): qty 1

## 2014-03-29 MED ORDER — CIPROFLOXACIN IN D5W 400 MG/200ML IV SOLN
INTRAVENOUS | Status: AC
  Filled 2014-03-29: qty 200

## 2014-03-29 MED ORDER — IRBESARTAN 150 MG PO TABS
300.0000 mg | ORAL_TABLET | Freq: Every day | ORAL | Status: DC
  Administered 2014-03-29 – 2014-03-30 (×2): 300 mg via ORAL
  Filled 2014-03-29 (×2): qty 2

## 2014-03-29 MED ORDER — SODIUM CHLORIDE 0.9 % IR SOLN
Status: DC | PRN
  Administered 2014-03-29 (×5): 3000 mL

## 2014-03-29 MED ORDER — LACTATED RINGERS IV SOLN
INTRAVENOUS | Status: DC
  Administered 2014-03-29: 12:00:00 via INTRAVENOUS

## 2014-03-29 MED ORDER — BELLADONNA ALKALOIDS-OPIUM 16.2-60 MG RE SUPP
RECTAL | Status: AC
  Filled 2014-03-29: qty 1

## 2014-03-29 MED ORDER — CIPROFLOXACIN IN D5W 400 MG/200ML IV SOLN
INTRAVENOUS | Status: DC | PRN
  Administered 2014-03-29: 400 mg via INTRAVENOUS

## 2014-03-29 MED ORDER — PROPOFOL 10 MG/ML IV BOLUS
INTRAVENOUS | Status: AC
  Filled 2014-03-29: qty 20

## 2014-03-29 MED ORDER — ATENOLOL 25 MG PO TABS
25.0000 mg | ORAL_TABLET | Freq: Every day | ORAL | Status: DC
  Administered 2014-03-29 – 2014-03-31 (×3): 25 mg via ORAL
  Filled 2014-03-29 (×4): qty 1

## 2014-03-29 MED ORDER — EPHEDRINE SULFATE 50 MG/ML IJ SOLN
INTRAMUSCULAR | Status: DC | PRN
  Administered 2014-03-29 (×2): 10 mg via INTRAVENOUS

## 2014-03-29 MED ORDER — LATANOPROST 0.005 % OP SOLN
1.0000 [drp] | Freq: Every day | OPHTHALMIC | Status: DC
  Administered 2014-03-30: 1 [drp] via OPHTHALMIC
  Filled 2014-03-29: qty 2.5

## 2014-03-29 MED ORDER — SODIUM CHLORIDE 0.9 % IR SOLN
3000.0000 mL | Status: DC
  Administered 2014-03-29 – 2014-03-30 (×5): 3000 mL

## 2014-03-29 MED ORDER — TRAVOPROST (BAK FREE) 0.004 % OP SOLN
1.0000 [drp] | Freq: Every day | OPHTHALMIC | Status: DC
  Filled 2014-03-29: qty 2.5

## 2014-03-29 MED ORDER — FENTANYL CITRATE 0.05 MG/ML IJ SOLN
INTRAMUSCULAR | Status: DC | PRN
  Administered 2014-03-29: 25 ug via INTRAVENOUS
  Administered 2014-03-29: 50 ug via INTRAVENOUS
  Administered 2014-03-29 (×5): 25 ug via INTRAVENOUS

## 2014-03-29 MED ORDER — SODIUM CHLORIDE 0.9 % IR SOLN
3000.0000 mL | Status: DC
  Administered 2014-03-30: 3000 mL

## 2014-03-29 SURGICAL SUPPLY — 15 items
BAG URINE LEG 500ML (DRAIN) ×3 IMPLANT
CATH HEMA 3WAY 30CC 22FR COUDE (CATHETERS) ×3 IMPLANT
CATH URET 5FR 28IN OPEN ENDED (CATHETERS) ×3 IMPLANT
GLOVE BIOGEL PI IND STRL 6.5 (GLOVE) ×2 IMPLANT
GLOVE BIOGEL PI INDICATOR 6.5 (GLOVE) ×4
GOWN STRL REUS W/ TWL XL LVL3 (GOWN DISPOSABLE) ×3 IMPLANT
GOWN STRL REUS W/TWL XL LVL3 (GOWN DISPOSABLE) ×6
GUIDEWIRE STR DUAL SENSOR (WIRE) ×3 IMPLANT
LOOPS RESECTOSCOPE DISP (ELECTROSURGICAL) ×3 IMPLANT
PACK CYSTO (CUSTOM PROCEDURE TRAY) ×3 IMPLANT
SYR 30ML LL (SYRINGE) ×3 IMPLANT
SYRINGE IRR TOOMEY STRL 70CC (SYRINGE) ×3 IMPLANT
TUBING CONNECTING 10 (TUBING) ×2 IMPLANT
TUBING CONNECTING 10' (TUBING) ×1
WATER STERILE IRR 500ML POUR (IV SOLUTION) ×3 IMPLANT

## 2014-03-29 NOTE — Anesthesia Procedure Notes (Signed)
Procedure Name: LMA Insertion Date/Time: 03/29/2014 12:35 PM Performed by: Maxwell Caul Pre-anesthesia Checklist: Patient identified, Emergency Drugs available, Suction available and Patient being monitored Patient Re-evaluated:Patient Re-evaluated prior to inductionOxygen Delivery Method: Circle system utilized Preoxygenation: Pre-oxygenation with 100% oxygen Intubation Type: IV induction LMA: LMA inserted LMA Size: 4.0 Number of attempts: 1 Placement Confirmation: breath sounds checked- equal and bilateral and positive ETCO2 Tube secured with: Tape Dental Injury: Teeth and Oropharynx as per pre-operative assessment

## 2014-03-29 NOTE — Anesthesia Preprocedure Evaluation (Addendum)
Anesthesia Evaluation  Patient identified by MRN, date of birth, ID band Patient awake    Reviewed: Allergy & Precautions, H&P , NPO status , Patient's Chart, lab work & pertinent test results, reviewed documented beta blocker date and time   Airway Mallampati: II  TM Distance: >3 FB Neck ROM: Full    Dental  (+) Dental Advisory Given, Chipped Chipped right front upper tooth:   Pulmonary neg pulmonary ROS, former smoker,  breath sounds clear to auscultation  Pulmonary exam normal       Cardiovascular hypertension, Pt. on medications and Pt. on home beta blockers + dysrhythmias Atrial Fibrillation + Valvular Problems/Murmurs AS Rhythm:Regular Rate:Normal  Mild AS. bradycardia   Neuro/Psych negative neurological ROS  negative psych ROS   GI/Hepatic negative GI ROS, Neg liver ROS,   Endo/Other  diabetes, Well Controlled, Type 2Diet controlled DM  Renal/GU Renal diseaseStage 3 chronic kidney disease CR 1.48     Musculoskeletal negative musculoskeletal ROS (+)   Abdominal Normal abdominal exam  (+)   Peds  Hematology negative hematology ROS (+) anemia , hgb 9.8   Anesthesia Other Findings   Reproductive/Obstetrics                           Anesthesia Physical Anesthesia Plan  ASA: III  Anesthesia Plan: General   Post-op Pain Management:    Induction:   Airway Management Planned: LMA  Additional Equipment:   Intra-op Plan:   Post-operative Plan:   Informed Consent:   Plan Discussed with: Surgeon  Anesthesia Plan Comments:         Anesthesia Quick Evaluation

## 2014-03-29 NOTE — Op Note (Signed)
Preoperative diagnosis:  1. Gross hematuria   Postoperative diagnosis:  1. Recurrent bladder cancer, approximately 2 cm tumor located in the posterior wall of the bladder. 2. Several flatter appearing lesions around the bladder tumor, concerning for a CIS 3. Several posterior urethral false passages 4. Bleeding at the bladder neck, particularly in the 3 to 4:00 position 5. Large blood clot within the bladder  Procedure: 1. Cystoscopy, blood clot evacuation 2. Fulguration of the bladder neck at the 3:00 to 4:00 position 3. Retrograde pyelogram on the right with interpretation 4. Transurethral resection of bladder tumor at the posterior bladder wall, 2 cm in size  Surgeon: Ardis Hughs, MD  Anesthesia: General  Complications: None  Intraoperative findings:  #1 there were several false passages within the bulbar and membranous urethra from failed Foley catheter attempts. #2 there was some small bleeding vessels at the bladder neck in the 3:00 to 4 clock position which were fulgurated. #3 there was a large clot within the bladder that was evacuated #4 the bladder was trabeculated with a small diverticulum just posterior to the trigone #5 the right ureteral orifice was readily apparent and a retrograde pyelogram was performed demonstrating a normal caliber ureter with no filling defects within the ureter or renal pelvis. #6 the left ureteral orifice was not located. It was facing posterior on the downslope of the trigone. I was not able to visualize it despite giving the patient methylene blue. As such, a retrograde pyelogram was not performed on the left side. #7 there was a 2 cm papillary lesion on the posterior wall of the bladder with surrounding carpet-like/flat lesions concerning for CIS.  EBL: Minimal  Specimens: The bladder tumor and surrounding mucosa were sent to pathology together. The resection was seemingly deep although no obvious perforation noted.  Indication:  Alexander Barnett is a 81 y.o. patient with long history of prostate cancer status post external beam radiation therapy as well as bladder cancer. He was admitted to the hospital 2 days prior with gross hematuria. After 2 days of conservative management and persistent clot and irrigation requirements, a decision was made to take the patient to the operating room for clot evacuation, cystoscopy, and potentially resection of the recurrent bladder tumor.  After reviewing the management options for treatment, he elected to proceed with the above surgical procedure(s). We have discussed the potential benefits and risks of the procedure, side effects of the proposed treatment, the likelihood of the patient achieving the goals of the procedure, and any potential problems that might occur during the procedure or recuperation. Informed consent has been obtained.  Description of procedure:  The patient was taken to the operating room and general anesthesia was induced.  The patient was placed in the dorsal lithotomy position, prepped and draped in the usual sterile fashion, and preoperative antibiotics were administered. A preoperative time-out was performed.   A 22 French 30 cystoscope was then gently passed through the patient's urethra and cystourethroscopy performed with the above findings. I then exchanged the cystoscope sheath for a resectoscope sheath in order to help irrigate the large blood clot within the bladder. I then exchanged for the 22 French 30 cystoscope again and fulgurated the small vessels around the bladder neck that were bleeding. Once the bleeding was stopped I was able to perform a 360 cystoscopic evaluation noting the above findings. I then performed a right retrograde pyelogram in the routine fashion with the above findings. This point I then replaced the 22 French cystoscope sheath  for the resectoscope sheath and using a loop, resected the tumor and surrounding area. All visible tumor was  either resected or fulgurated. Hemostasis was then meticulously achieved. The specimen was then evacuated from the bladder and sent to pathology. I then passed a 41 [place] three-way Foley catheter using a stylette into the patient's bladder. Effluent was notably clear, and as such CBI was not initiated. A B and O suppository was placed into the patient's rectum. The patient was subsequently awoken and returned to the PACU in good condition.  Ardis Hughs, M.D.

## 2014-03-29 NOTE — Anesthesia Postprocedure Evaluation (Signed)
  Anesthesia Post-op Note  Patient: Alexander Barnett  Procedure(s) Performed: Procedure(s) (LRB): CYSTOSCOPY, CLOT EVACUATION, RIGHT RETROGRADE, TRANSURETHRAL RESECTION OF BLADDER TUMOR (TURBT) (N/A)  Patient Location: PACU  Anesthesia Type: General  Level of Consciousness: awake and alert   Airway and Oxygen Therapy: Patient Spontanous Breathing  Post-op Pain: mild  Post-op Assessment: Post-op Vital signs reviewed, Patient's Cardiovascular Status Stable, Respiratory Function Stable, Patent Airway and No signs of Nausea or vomiting  Last Vitals:  Filed Vitals:   03/29/14 0412  BP: 143/63  Pulse: 72  Temp: 36.8 C  Resp: 19    Post-op Vital Signs: stable   Complications: No apparent anesthesia complications

## 2014-03-29 NOTE — Transfer of Care (Signed)
Immediate Anesthesia Transfer of Care Note  Patient: Alexander Barnett  Procedure(s) Performed: Procedure(s) (LRB): CYSTOSCOPY, CLOT EVACUATION, RIGHT RETROGRADE, TRANSURETHRAL RESECTION OF BLADDER TUMOR (TURBT) (N/A)  Patient Location: PACU  Anesthesia Type: General  Level of Consciousness: sedated, patient cooperative and responds to stimulation  Airway & Oxygen Therapy: Patient Spontanous Breathing and Patient connected to face mask oxgen  Post-op Assessment: Report given to PACU RN and Post -op Vital signs reviewed and stable  Post vital signs: Reviewed and stable  Complications: No apparent anesthesia complications

## 2014-03-29 NOTE — Progress Notes (Signed)
Patient ID: Alexander Barnett, male   DOB: 1920-04-06, 81 y.o.   MRN: [id number]  Day of Surgery Subjective: No change in hematuria in last 24 hours - consistently getting 1-2 syringes of clot with each irrigation. No other complaints this AM.  Objective: Vital signs in last 24 hours: Temp:  [98.2 F (36.8 C)-98.9 F (37.2 C)] 98.2 F (36.8 C) (03/13 0412) Pulse Rate:  [72-74] 72 (03/13 0412) Resp:  [18-19] 19 (03/13 0412) BP: (139-147)/(63-77) 143/63 mmHg (03/13 0412) SpO2:  [96 %-98 %] 96 % (03/13 0412)  Intake/Output from previous day: 03/12 0701 - 03/13 0700 In: 2260.8 [P.O.:240; I.V.:835.8] Out: 2860 [Urine:2860] Intake/Output this shift: Total I/O In: 160 [Other:160] Out: 160 [Urine:160]  Physical Exam:  General: Alert and oriented GU: Urine red tinged, draining well  Lab Results:  Recent Labs  03/26/14 1515 03/27/14 0500 03/28/14 0404  HGB 11.3* 10.3* 9.8*  HCT 34.0* 30.9* 29.9*   BMET  Recent Labs  03/26/14 1515  NA 140  K 4.2  CL 107  CO2 26  GLUCOSE 105*  BUN 26*  CREATININE 1.43*  CALCIUM 8.9     Studies/Results: No results found.  Assessment/Plan: - On-going gross hematuria - dwindling Hgb, otherwise stable.  Will plan cysto/clot evac in OR today.  LOS: 3 days   Ardis Hughs 03/29/2014, 10:03 AM

## 2014-03-30 ENCOUNTER — Encounter (HOSPITAL_COMMUNITY): Payer: Self-pay | Admitting: Urology

## 2014-03-30 LAB — GLUCOSE, CAPILLARY: Glucose-Capillary: 119 mg/dL — ABNORMAL HIGH (ref 70–99)

## 2014-03-30 MED ORDER — PHENOL 1.4 % MT LIQD
1.0000 | OROMUCOSAL | Status: DC | PRN
  Filled 2014-03-30: qty 177

## 2014-03-30 MED ORDER — MENTHOL 3 MG MT LOZG
1.0000 | LOZENGE | OROMUCOSAL | Status: DC | PRN
  Filled 2014-03-30 (×4): qty 9

## 2014-03-30 MED ORDER — ACETAMINOPHEN 325 MG PO TABS
650.0000 mg | ORAL_TABLET | ORAL | Status: DC | PRN
  Administered 2014-03-30: 650 mg via ORAL
  Filled 2014-03-30: qty 2

## 2014-03-30 NOTE — Progress Notes (Signed)
Patient ID: Alexander Barnett, male   DOB: November 20, 1920, 81 y.o.   MRN: [id number]  1 Day Post-Op Subjective: Pt still on moderate CBI.  Urine light pink to red overnight.  Did not need to be irrigated.  S/P TURBT yesterday and fulguration of bleeding areas at bladder neck.  Objective: Vital signs in last 24 hours: Temp:  [97.5 F (36.4 C)-98.2 F (36.8 C)] 97.5 F (36.4 C) (03/14 0533) Pulse Rate:  [58-92] 74 (03/14 0533) Resp:  [11-19] 19 (03/14 0533) BP: (97-146)/(50-80) 112/52 mmHg (03/14 0533) SpO2:  [96 %-100 %] 99 % (03/14 0533)  Intake/Output from previous day: 03/13 0701 - 03/14 0700 In: 10633.3 [P.O.:720; I.V.:2253.3] Out: 7160 [Urine:7160] Intake/Output this shift:    Physical Exam:  General: Alert and oriented GU: Minimal clots when manually irrigated this morning.    Lab Results:  Recent Labs  03/28/14 0404 03/29/14 1544  HGB 9.8* 9.0*  HCT 29.9* 27.0*   BMET  Recent Labs  03/29/14 1544  NA 136  K 3.5  CL 106  CO2 25  GLUCOSE 108*  BUN 11  CREATININE 1.37*  CALCIUM 8.0*     Studies/Results: No results found.  Assessment/Plan: Continue CBI and titrate down. Path pending from TURBT   LOS: 4 days   Jceon Alverio,LES 03/30/2014, 7:45 AM

## 2014-03-30 NOTE — Progress Notes (Addendum)
Patient ID: Alexander Barnett, male   DOB: [birth year], 81 y.o.   MRN: [id number]  Pt with an episode where his right knee became weak and he slumped down.  He did not hit himself anywhere or suffer any injury.  He has been mildly orthostatic but now hemodynamically stable and oriented to person, place, and time.  He denies dizziness, chest pain, SOB, etc.  Urine has continued to be red to pink off and on with CBI still running.    He states that he took Ambien last night and has been "out of it" today until more recently.  - Hold Ambien - Continue CBI - Monitor Hgb and vitals - Will keep on telemetry overnight due to episode today - Will hold Bracey heparin with ongoing bleeding issues, continue SCDs

## 2014-03-30 NOTE — Evaluation (Addendum)
Physical Therapy Evaluation Patient Details Name: Alexander Barnett MRN: [id number] DOB: 1920/03/30 Today's Date: 03/30/2014   History of Present Illness  This 81 year old male with a history of adenocarcinoma of the prostate, status post radiotherapy, with history of recurrent radiation-induced hemorrhagic cystitis  admitted with recent onset of gross painless hematuria.  S/P TURBT 03/29/14.  Clinical Impression  Pt admitted with above diagnosis. Pt currently with functional limitations due to the deficits listed below (see PT Problem List). Pt is active and independent with mobility at baseline. With sit to stand pt reported moderate dizziness today. Pt noted to have significant orthostatic hypotension. BP sitting 102/48, standing 77/45. Gait deferred due to low BP. Expect he'll return to independence with mobility once hypotension resolves.  Pt will benefit from skilled PT to increase their independence and safety with mobility to allow discharge to the venue listed below.       Follow Up Recommendations No PT follow up    Equipment Recommendations  None recommended by PT    Recommendations for Other Services       Precautions / Restrictions Precautions Precautions: Fall Precaution Comments: no h/o falls however pt has orthostatic hypotension on PT eval Restrictions Weight Bearing Restrictions: No      Mobility  Bed Mobility Overal bed mobility: Independent                Transfers Overall transfer level: Needs assistance Equipment used: Rolling walker (2 wheeled) Transfers: Sit to/from Stand Sit to Stand: Min guard         General transfer comment: min/guard due to hypotension  Ambulation/Gait             General Gait Details: deferred due to orthostatic hypotension  Stairs            Wheelchair Mobility    Modified Rankin (Stroke Patients Only)       Balance Overall balance assessment: Independent                                            Pertinent Vitals/Pain Pain Assessment: 0-10 Pain Score: 7  Pain Location: R medial knee, pain started after surgery Pain Descriptors / Indicators: Sore Pain Intervention(s): Monitored during session;Limited activity within patient's tolerance;Patient requesting pain meds-RN notified    Home Living Family/patient expects to be discharged to:: Private residence Living Arrangements: Alone Available Help at Discharge: Friend(s);Available PRN/intermittently Type of Home: House Home Access: Stairs to enter Entrance Stairs-Rails: Left;Right;Can reach both Entrance Stairs-Number of Steps: 3 Home Layout: Two level Home Equipment: None      Prior Function Level of Independence: Independent               Hand Dominance        Extremity/Trunk Assessment   Upper Extremity Assessment: Overall WFL for tasks assessed           Lower Extremity Assessment: Overall WFL for tasks assessed      Cervical / Trunk Assessment: Normal  Communication   Communication: HOH  Cognition Arousal/Alertness: Awake/alert Behavior During Therapy: WFL for tasks assessed/performed Overall Cognitive Status: Within Functional Limits for tasks assessed                      General Comments      Exercises        Assessment/Plan    PT Assessment Patient needs  continued PT services  PT Diagnosis Generalized weakness;Difficulty walking   PT Problem List Decreased activity tolerance;Cardiopulmonary status limiting activity;Decreased knowledge of use of DME  PT Treatment Interventions Gait training;Stair training;Functional mobility training;Therapeutic activities;Patient/family education;Therapeutic exercise   PT Goals (Current goals can be found in the Care Plan section) Acute Rehab PT Goals Patient Stated Goal: return to flying model airplanes, playing golf PT Goal Formulation: With patient Time For Goal Achievement: 04/13/14 Potential to Achieve Goals: Good     Frequency Min 3X/week   Barriers to discharge        Co-evaluation               End of Session Equipment Utilized During Treatment: Gait belt Activity Tolerance: Treatment limited secondary to medical complications (Comment) (orthostatic hypotension) Patient left: in bed;with call bell/phone within reach;with bed alarm set Nurse Communication: Mobility status         Time: 4235-3614 PT Time Calculation (min) (ACUTE ONLY): 33 min   Charges:   PT Evaluation $Initial PT Evaluation Tier I: 1 Procedure PT Treatments $Therapeutic Activity: 8-22 mins   PT G Codes:        Philomena Doheny 03/30/2014, 2:00 PM [phone]

## 2014-03-31 LAB — HEMOGLOBIN AND HEMATOCRIT, BLOOD
HCT: 23.1 % — ABNORMAL LOW (ref 39.0–52.0)
Hemoglobin: 7.8 g/dL — ABNORMAL LOW (ref 13.0–17.0)

## 2014-03-31 NOTE — Progress Notes (Signed)
Patient ID: Alexander Barnett, male   DOB: [birth year], 81 y.o.   MRN: [id number]  2 Days Post-Op Subjective: Pt with moderate CBI still running but urine has been clearing overnight with no clots.  Feels better after not having taken Ambien last night.  Objective: Vital signs in last 24 hours: Temp:  [98.5 F (36.9 C)-99.4 F (37.4 C)] 99.3 F (37.4 C) (03/15 0544) Pulse Rate:  [65-102] 79 (03/15 0544) Resp:  [18-20] 18 (03/15 0544) BP: (77-118)/(37-87) 112/48 mmHg (03/15 0544) SpO2:  [93 %-100 %] 93 % (03/15 0544)  Intake/Output from previous day: 03/14 0701 - 03/15 0700 In: 13041.7 [P.O.:720; I.V.:1221.7] Out: 26801 [Urine:26800; Stool:1] Intake/Output this shift:    Physical Exam:  General: Alert and oriented CV: RRR Lungs: Clear Abdomen: Soft, ND GU: Urine light pink on moderate CBI Ext: NT, No erythema  Lab Results:  Recent Labs  03/29/14 1544 03/31/14 0700  HGB 9.0* 7.8*  HCT 27.0* 23.1*   BMET  Recent Labs  03/29/14 1544  NA 136  K 3.5  CL 106  CO2 25  GLUCOSE 108*  BUN 11  CREATININE 1.37*  CALCIUM 8.0*     Studies/Results: No results found.  Assessment/Plan: - Urine improved.  Will continue to titrate down CBI. - Recheck Hgb tomorrow and will consider transfusion if necessary or if still evidence of orthostasis today. - Pathology from TURBT pending.   LOS: 5 days   Alexander Barnett,LES 03/31/2014, 8:06 AM

## 2014-03-31 NOTE — Progress Notes (Signed)
Physical Therapy Treatment Patient Details Name: Alexander Barnett MRN: [id number] DOB: April 22, 1920 Today's Date: 03/31/2014    History of Present Illness This 81 year old male with a history of adenocarcinoma of the prostate, status post radiotherapy, with history of recurrent radiation-induced hemorrhagic cystitis  admitted with recent onset of gross painless hematuria.  S/P TURBT 03/29/14.    PT Comments    Noted pt's orthostatic BPs are improved from yesterday, though he continues to have a significant drop in systolic. He denied dizziness in standing and was able to walk 400' with RW without difficulty.   Follow Up Recommendations  No PT follow up     Equipment Recommendations  None recommended by PT    Recommendations for Other Services       Precautions / Restrictions Precautions Precautions: Fall Precaution Comments: no h/o falls however pt has orthostatic hypotension on PT eval Restrictions Weight Bearing Restrictions: No    Mobility  Bed Mobility Overal bed mobility: Independent                Transfers Overall transfer level: Needs assistance Equipment used: Rolling walker (2 wheeled) Transfers: Sit to/from Stand Sit to Stand: Supervision         General transfer comment: cues hand placement  Ambulation/Gait Ambulation/Gait assistance: Supervision Ambulation Distance (Feet): 400 Feet Assistive device: Rolling walker (2 wheeled) Gait Pattern/deviations: WFL(Within Functional Limits)   Gait velocity interpretation: at or above normal speed for age/gender     Stairs            Wheelchair Mobility    Modified Rankin (Stroke Patients Only)       Balance Overall balance assessment: Modified Independent                                  Cognition Arousal/Alertness: Awake/alert Behavior During Therapy: WFL for tasks assessed/performed Overall Cognitive Status: Within Functional Limits for tasks assessed                       Exercises      General Comments        Pertinent Vitals/Pain Pain Assessment: No/denies pain    Home Living                      Prior Function            PT Goals (current goals can now be found in the care plan section) Acute Rehab PT Goals Patient Stated Goal: return to flying model airplanes, playing golf PT Goal Formulation: With patient Time For Goal Achievement: 04/13/14 Potential to Achieve Goals: Good Progress towards PT goals: Progressing toward goals    Frequency  Min 3X/week    PT Plan Current plan remains appropriate    Co-evaluation             End of Session Equipment Utilized During Treatment: Gait belt Activity Tolerance: Patient tolerated treatment well (orthostatic hypotension) Patient left: with call bell/phone within reach;in chair;with chair alarm set     Time: 3382-5053 PT Time Calculation (min) (ACUTE ONLY): 24 min  Charges:  $Gait Training: 23-37 mins                    G Codes:      Philomena Doheny 03/31/2014, 12:38 PM [phone]

## 2014-04-01 LAB — HEMOGLOBIN AND HEMATOCRIT, BLOOD
HEMATOCRIT: 23 % — AB (ref 39.0–52.0)
HEMOGLOBIN: 7.6 g/dL — AB (ref 13.0–17.0)

## 2014-04-01 NOTE — Discharge Summary (Signed)
Date of admission: 03/26/2014  Date of discharge: 04/01/2014  Admission diagnosis: Gross hematuria, clot urinary retention  Discharge diagnosis: Gross hematuria, clot urinary retention, bladder tumor, False urethral passage, acute blood loss anemia  Secondary diagnoses: Hypertension  History and Physical: For full details, please see admission history and physical. Briefly, Alexander Barnett is a 81 y.o. 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 He also has a history of recurrent radiation cystitis with significant bleeding previously requiring blood transfusion and hyperbaric oxygen.  He presented to the urology clinic with recurrent gross hematuria.  Attempts to place a hematuria catheter were unsuccessful.  He required cystoscopy and placement of a catheter over a wire.  He was admitted for further evaluation.  Hospital Course: He was initially managed with periodic manual irrigation.  However, he had persistent hematuria and was eventually taken to the operating room by Dr. Louis Meckel on 03/29/14.  He was noted to have persistent bleeding at the bladder neck that was fulgurated.  Cystoscopic findings revealed a recurrent bladder tumor that was transurethrally resected.  There are also other areas that raise concern for carcinoma in situ and were fulgurated. His pathology eventually returned for high-grade, Ta urothelial carcinoma.  He continued to have hematuria requiring continuous bladder irrigation postoperatively.  After approximately another 48 hours, his continuous bladder irrigation was able to be weaned off.  His catheter continued to drain well without evidence of persistent worsening bleeding by 04/01/14.  He also had an episode of weakness and orthostasis on 03/30/14.  After discussing this with the patient, he felt that this was related to taking Ambien the night before.  This  medication was then withheld his hemoglobin although low was felt to be stable.  He remained stable and nonsymptomatic.  He was felt to be able to be discharged home in stable condition on 04/01/14.  He was discharged home with his catheter.  Laboratory values:  Recent Labs  03/29/14 1544 03/31/14 0700 04/01/14 0418  HGB 9.0* 7.8* 7.6*  HCT 27.0* 23.1* 23.0*    Recent Labs  03/29/14 1544  CREATININE 1.37*    Disposition: Home  Discharge instruction: The patient was instructed to be ambulatory but told to refrain from heavy lifting, strenuous activity, or driving.   Discharge medications:    Medication List    STOP taking these medications        amLODipine 5 MG tablet  Commonly known as:  NORVASC     olmesartan 40 MG tablet  Commonly known as:  BENICAR      TAKE these medications        atenolol 25 MG tablet  Commonly known as:  TENORMIN  TAKE 1 TABLET (25 MG TOTAL) BY MOUTH DAILY.     cholecalciferol 1000 UNITS tablet  Commonly known as:  VITAMIN D  Take 1,000 Units by mouth daily.     Co Q-10 100 MG Caps  Take 1 capsule by mouth daily.     finasteride 5 MG tablet  Commonly known as:  PROSCAR  Take 5 mg by mouth every morning.     multivitamin with minerals Tabs tablet  Take 1 tablet by mouth daily.     travoprost (benzalkonium) 0.004 % ophthalmic solution  Commonly known as:  TRAVATAN  Place 1 drop into both eyes at bedtime.        Followup:  Follow-up Information    Follow up with Dutch Gray, MD.   Specialty:  Urology   Why:  Office to call to arrange for next week   Contact information:   [place] [address] [phone]

## 2014-04-01 NOTE — Progress Notes (Signed)
Patient ID: Alexander Barnett, male   DOB: 22-Feb-1920, 81 y.o.   MRN: [id number]  3 Days Post-Op Subjective: Pt doing well.  Urine has remained mostly clear overnight off CBI. No dizziness or orthostasis.  Objective: Vital signs in last 24 hours: Temp:  [98.4 F (36.9 C)-98.9 F (37.2 C)] 98.4 F (36.9 C) (03/16 0414) Pulse Rate:  [49-80] 71 (03/16 0414) Resp:  [20] 20 (03/16 0414) BP: (98-148)/(44-61) 148/58 mmHg (03/16 0414) SpO2:  [93 %-98 %] 93 % (03/16 0414)  Intake/Output from previous day: 03/15 0701 - 03/16 0700 In: 1680 [P.O.:480; I.V.:1200] Out: 3451 [Urine:3450; Stool:1] Intake/Output this shift:    Physical Exam:  General: Alert and oriented Urine mostly clear and draining well  Lab Results:  Recent Labs  03/29/14 1544 03/31/14 0700 04/01/14 0418  HGB 9.0* 7.8* 7.6*  HCT 27.0* 23.1* 23.0*   BMET  Recent Labs  03/29/14 1544  NA 136  K 3.5  CL 106  CO2 25  GLUCOSE 108*  BUN 11  CREATININE 1.37*  CALCIUM 8.0*     Studies/Results: No results found.  Assessment/Plan: Hgb stable.  Will check orthostatics and ambulate this morning.  If hemodynamically stable and asymptomatic from anemia, will d/c home with catheter this morning.  If symptomatic or orthostatic, will consider blood transfusion today.   LOS: 6 days   Alexander Barnett,LES 04/01/2014, 8:41 AM

## 2014-04-01 NOTE — Discharge Instructions (Signed)

## 2014-04-01 NOTE — Progress Notes (Signed)
Patient negative for orthostatics and any weakness or dizziness upon standing and ambulation.  Will proceed with discharge.

## 2014-04-01 NOTE — Progress Notes (Signed)
Refused night orthostatics

## 2014-04-22 ENCOUNTER — Telehealth: Payer: Self-pay | Admitting: Cardiology

## 2014-04-22 NOTE — Telephone Encounter (Signed)
Spoke with pt. He will check with insurance and let us know.

## 2014-04-22 NOTE — Telephone Encounter (Signed)
Notified that Dr. Radford Pax recommends starting Losartan 100 mg daily and then check BP daily and record for a week and call results to office.  He states he just got a new 90 day Rx and was $300 so he wants to take those before starting on Losartan.  Advised for him to call us back when he has a couple of weeks left of Rx and will send Rx for Losartan to pharmacy.  He verbalizes understanding.

## 2014-04-22 NOTE — Telephone Encounter (Signed)
New message     Pt c/o medication issue:  1. Name of Medication: benicar  2. How are you currently taking this medication (dosage and times per day)? 40mg  daily 3. Are you having a reaction (difficulty breathing--STAT)?   4. What is your medication issue? copay for benicar is 300 plus dollars.  Is there something else he can take?

## 2014-04-22 NOTE — Telephone Encounter (Signed)
Left message to call back  

## 2014-04-22 NOTE — Telephone Encounter (Signed)
Pt called back.  He has checked with insurance and Irbesartan, Losartan, Telmisartan and Valsartan would be covered at Tier 1

## 2014-04-22 NOTE — Telephone Encounter (Signed)
Please find out from insurance which ARB is covered

## 2014-04-22 NOTE — Telephone Encounter (Signed)
Will forward to Dr Turner for review  

## 2014-04-22 NOTE — Telephone Encounter (Signed)
Change Benicar to Losartan 100mg  daily and have patient check BP daily for a week on new med and call with results

## 2014-07-17 ENCOUNTER — Other Ambulatory Visit: Payer: Self-pay | Admitting: *Deleted

## 2014-07-17 MED ORDER — LOSARTAN POTASSIUM 100 MG PO TABS: 100.0000 mg | ORAL_TABLET | Freq: Every day | ORAL | Status: DC

## 2014-07-17 NOTE — Telephone Encounter (Signed)
Patient called and stated that he is almost finished with the benicar and is ready for an rx to be sent in for losartan. Per 04/22/14 phone note, patient would change from Benicar to Losartan 100mg  daily and check BP daily for a week on new med and call with results. Rx sent in and he is aware to call with BP readings.

## 2014-08-21 ENCOUNTER — Telehealth: Payer: Self-pay | Admitting: Cardiology

## 2014-08-21 NOTE — Telephone Encounter (Signed)
Stable BP and HR - no changes in meds

## 2014-08-21 NOTE — Telephone Encounter (Signed)
New Message  Pt wanted to relay his latest BP measures 7/27- 1025am 134/67 p 61 7/28- 11am 141/69 p 64 7/29 1030am 116/60 p 65 7/29 135pm 119/63 p 64 7/30 148pm 131/63 p 65 7/31 1150am 142/80 p 63 8/1 945am 140/67 p 67 8/4 3pm 144/17 p 56 8/5 245pm 122/65 p 57  Pt wanted to mention that he thinks the change to Benicar to cozar has been successfully allowing him to control the BP.

## 2014-08-21 NOTE — Telephone Encounter (Signed)
Left message for patient thanking him for BP readings and to continue current medications. Instructed him to call back if he has any questions or concerns.

## 2014-08-26 ENCOUNTER — Encounter: Payer: Self-pay | Admitting: Cardiology

## 2014-11-04 ENCOUNTER — Other Ambulatory Visit: Payer: Self-pay | Admitting: Dermatology

## 2014-12-07 NOTE — Progress Notes (Signed)
Cardiology Office Note   Date:  12/08/2014   ID:  Alexander Barnett, DOB 02-02-1920, MRN [id number]  PCP:  Alessandra Grout, MD    Chief Complaint  Patient presents with  . Atrial Fibrillation  . Aortic Stenosis      History of Present Illness: Alexander Barnett is a 81 y.o. male with a history of PAF, asymptomatic bradycardia,mild AS and HTN who presents today for followup. He is doing well. He denies any chest pain, SOB, DOE, LE edema, dizziness, palpitations or syncope.    Past Medical History  Diagnosis Date  . Arthritis   . History of skin cancer   . Hematuria   . Anemia   . Hx of transfusion   . History of kidney cancer   . Hypertension   . Diabetes mellitus without complication     prediabetes diet controlled  . PAF (paroxysmal atrial fibrillation) 06/2006    refused anticoagulation  . Bradycardia   . BPH (benign prostatic hyperplasia)   . Erectile dysfunction   . Diverticulosis of colon   . CKD (chronic kidney disease) stage 3, GFR 30-59 ml/min   . Aortic stenosis, mild   . Prostate cancer   . 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 Diastolic dysfunction     Past Surgical History  Procedure Laterality Date  . Tonsillectomy      as child  . Hernia repair  05/2002    right  . Eye surgery  02/2004    bil.cataract w/ lens implant  . Mohs surgery  02/2002    nose  . Nephrectomy  03/2007    left  . Prostate surgery  12/2003  . Targus  04/1998    to prostate  . Imrt radiation  04/2006    for prostate cancer  . Prostate surgery  03/1998    surgery in prostate area  . Urinary surgery  03/1998    bleeding  . Hyperbaric treatments      40 treatments for prostate-to try to stop bleeding  . Transurethral resection of prostate N/A 06/24/2012    Procedure: TRANSURETHRAL RESECTION OF THE PROSTATE (TURP);  Surgeon: Dutch Gray, MD;  Location: [facility];  Service: Urology;  Laterality: N/A;  CYSTOSCOPY, TRANSURETHRAL  RESECTION OF PROSTATIC URETHRAL TUMOR   . Cystoscopy N/A 06/24/2012    Procedure: CYSTOSCOPY;  Surgeon: Dutch Gray, MD;  Location: [facility];  Service: Urology;  Laterality: N/A;  . Cystoscopy N/A 08/05/2012    Procedure: CYSTOSCOPY;  Surgeon: Dutch Gray, MD;  Location: [facility];  Service: Urology;  Laterality: N/A;  CYSTO, CLOT EVACUATION, FULGERATION OF BLEEDING AREAS AT BLADDER NECK AND PROSTATIC URETHRA    . Transurethral resection of bladder tumor N/A 03/29/2014    Procedure: CYSTOSCOPY, CLOT EVACUATION, RIGHT RETROGRADE, TRANSURETHRAL RESECTION OF BLADDER TUMOR (TURBT);  Surgeon: Ardis Hughs, MD;  Location: [facility];  Service: Urology;  Laterality: N/A;     Current Outpatient Prescriptions  Medication Sig Dispense Refill  . atenolol (TENORMIN) 25 MG tablet TAKE 1 TABLET (25 MG TOTAL) BY MOUTH DAILY. 90 tablet 2  . cholecalciferol (VITAMIN D) 1000 UNITS tablet Take 1,000 Units by mouth daily.    . Coenzyme Q10 (CO Q-10) 100 MG CAPS Take 1 capsule by mouth daily.     . finasteride (PROSCAR) 5 MG tablet Take 5 mg by mouth every morning.     Marland Kitchen  losartan (COZAAR) 100 MG tablet Take 1 tablet (100 mg total) by mouth daily. 90 tablet 1  . Multiple Vitamin (MULTIVITAMIN WITH MINERALS) TABS Take 1 tablet by mouth daily.    . travoprost, benzalkonium, (TRAVATAN) 0.004 % ophthalmic solution Place 1 drop into both eyes at bedtime.     No current facility-administered medications for this visit.    Allergies:   Review of patient's allergies indicates no known allergies.    Social History:  The patient  reports that he quit smoking about 52 years ago. His smoking use included Cigarettes. He has a 30 pack-year smoking history. He does not have any smokeless tobacco history on file. He reports that he drinks about 0.6 oz of alcohol per week. He reports that he does not use illicit drugs.   Family History:  The patient's family history is not on file.    ROS:  Please see the history of present  illness.   Otherwise, review of systems are positive for none.   All other systems are reviewed and negative.    PHYSICAL EXAM: VS:  BP 160/70 mmHg  Ht 5\' 6"  (1.676 m)  Wt 70.035 kg (154 lb 6.4 oz)  BMI 24.93 kg/m2 , BMI Body mass index is 24.93 kg/(m^2). GEN: Well nourished, well developed, in no acute distress HEENT: normal Neck: no JVD, carotid bruits, or masses Cardiac: RRR; no rubs, or gallops,no edema.  2/6 SM at RUSB Respiratory:  clear to auscultation bilaterally, normal work of breathing GI: soft, nontender, nondistended, + BS MS: no deformity or atrophy Skin: warm and dry, no rash Neuro:  Strength and sensation are intact Psych: euthymic mood, full affect   EKG:  EKG is ordered today. The ekg ordered today demonstrates NSR with PAC's   Recent Labs: 03/29/2014: BUN 11; Creatinine, Ser 1.37*; Platelets 128*; Potassium 3.5; Sodium 136 04/01/2014: Hemoglobin 7.6*    Lipid Panel    Component Value Date/Time   CHOL  07/11/2006 0650    186        ATP III CLASSIFICATION:  <200     mg/dL   Desirable  200-239  mg/dL   Borderline High  >=240    mg/dL   High   TRIG 46 07/11/2006 0650   HDL 61 07/11/2006 0650   CHOLHDL 3.0 07/11/2006 0650   VLDL 9 07/11/2006 0650   LDLCALC * 07/11/2006 0650    116        Total Cholesterol/HDL:CHD Risk Coronary Heart Disease Risk Table                     Men   Women  1/2 Average Risk   3.4   3.3      Wt Readings from Last 3 Encounters:  12/08/14 70.035 kg (154 lb 6.4 oz)  03/26/14 70.988 kg (156 lb 8 oz)  12/05/13 73.029 kg (161 lb)    ASSESSMENT AND PLAN:   1. PAF maintaining NSR with no reoccurrence. - continue Atenolol  2. HTN - borderline controlled - continue Atenolol and losartan - I have asked him to check his BP daily for a week and call with results.  3. Diastolic Dysfunction  4. Mild to moderate AS  - check 2D echo to reassess AS   5. CKD stage 3 -  per PCP    Current medicines are reviewed at length with the patient today.  The patient does not have concerns regarding medicines.  The following changes have been made:  no  change  Labs/ tests ordered today: See above Assessment and Plan  Orders Placed This Encounter  Procedures  . EKG 12-Lead     Disposition:   FU with me in 1 year  Signed, Sueanne Margarita, MD  12/08/2014 9:43 AM    [facility] [address], [place], [place]  [address] Phone: [phone]; Fax: [phone]

## 2014-12-08 ENCOUNTER — Ambulatory Visit (INDEPENDENT_AMBULATORY_CARE_PROVIDER_SITE_OTHER): Payer: Medicare Other | Admitting: Cardiology

## 2014-12-08 VITALS — BP 160/70 | Ht 66.0 in | Wt 154.4 lb

## 2014-12-08 DIAGNOSIS — I350 Nonrheumatic aortic (valve) stenosis: Secondary | ICD-10-CM | POA: Diagnosis not present

## 2014-12-08 DIAGNOSIS — I10 Essential (primary) hypertension: Secondary | ICD-10-CM | POA: Diagnosis not present

## 2014-12-08 DIAGNOSIS — I480 Paroxysmal atrial fibrillation: Secondary | ICD-10-CM | POA: Diagnosis not present

## 2014-12-08 MED ORDER — LOSARTAN POTASSIUM 100 MG PO TABS: 100.0000 mg | ORAL_TABLET | Freq: Every day | ORAL | Status: DC

## 2014-12-08 MED ORDER — ATENOLOL 25 MG PO TABS: 25.0000 mg | ORAL_TABLET | Freq: Every day | ORAL | Status: DC

## 2014-12-08 NOTE — Patient Instructions (Signed)
Medication Instructions:  Your physician recommends that you continue on your current medications as directed. Please refer to the Current Medication list given to you today.   Labwork: None  Testing/Procedures: Your physician has requested that you have an echocardiogram. Echocardiography is a painless test that uses sound waves to create images of your heart. It provides your doctor with information about the size and shape of your heart and how well your heart's chambers and valves are working. This procedure takes approximately one hour. There are no restrictions for this procedure.  Follow-Up: Your physician wants you to follow-up in: 1 year with Dr. Radford Pax. You will receive a reminder letter in the mail two months in advance. If you don't receive a letter, please call our office to schedule the follow-up appointment.   Any Other Special Instructions Will Be Listed Below (If Applicable). Please check your blood pressure daily for one week and call with results.    If you need a refill on your cardiac medications before your next appointment, please call your pharmacy.

## 2014-12-13 ENCOUNTER — Other Ambulatory Visit: Payer: Self-pay | Admitting: Cardiology

## 2014-12-21 ENCOUNTER — Other Ambulatory Visit: Payer: Self-pay

## 2014-12-21 ENCOUNTER — Telehealth: Payer: Self-pay | Admitting: Cardiology

## 2014-12-21 ENCOUNTER — Ambulatory Visit (HOSPITAL_COMMUNITY): Payer: Medicare Other | Attending: Cardiology

## 2014-12-21 DIAGNOSIS — I340 Nonrheumatic mitral (valve) insufficiency: Secondary | ICD-10-CM | POA: Diagnosis not present

## 2014-12-21 DIAGNOSIS — I517 Cardiomegaly: Secondary | ICD-10-CM | POA: Diagnosis not present

## 2014-12-21 DIAGNOSIS — I352 Nonrheumatic aortic (valve) stenosis with insufficiency: Secondary | ICD-10-CM | POA: Diagnosis not present

## 2014-12-21 DIAGNOSIS — I371 Nonrheumatic pulmonary valve insufficiency: Secondary | ICD-10-CM | POA: Diagnosis not present

## 2014-12-21 DIAGNOSIS — N189 Chronic kidney disease, unspecified: Secondary | ICD-10-CM | POA: Diagnosis not present

## 2014-12-21 DIAGNOSIS — I129 Hypertensive chronic kidney disease with stage 1 through stage 4 chronic kidney disease, or unspecified chronic kidney disease: Secondary | ICD-10-CM | POA: Insufficient documentation

## 2014-12-21 DIAGNOSIS — I350 Nonrheumatic aortic (valve) stenosis: Secondary | ICD-10-CM

## 2014-12-21 NOTE — Telephone Encounter (Signed)
Walk in pt form-BP Readings-dropped off-Katy back Tuesday 12/6 will give to her then

## 2014-12-22 ENCOUNTER — Telehealth: Payer: Self-pay

## 2014-12-22 DIAGNOSIS — I350 Nonrheumatic aortic (valve) stenosis: Secondary | ICD-10-CM

## 2014-12-22 NOTE — Telephone Encounter (Signed)
Walk-in form received containing a list of patient's BP and HR. All readings were taken between 1100A-1200 noon. 11/29: BP 135/70 11/30: BP 123/71  HR 55 12/1:   BP 132/72  HR 63 12/2:   BP 126/69  HR 63 12/3:   BP 130/78  HR 63  To Dr. Radford Pax for review.

## 2014-12-22 NOTE — Telephone Encounter (Signed)
Informed patient of results and verbal understanding expressed.  Repeat ECHO ordered to be scheduled in 1 year. Patient agrees with treatment plan. 

## 2014-12-22 NOTE — Telephone Encounter (Signed)
Good BP and HR - no change

## 2014-12-22 NOTE — Telephone Encounter (Signed)
-----   Message from Sueanne Margarita, MD sent at 12/21/2014  4:13 PM EST ----- Echo showed normal LVF with moderate AS and mild AR,mildly dilated RV and mild PR - repeat echo in 1 year

## 2014-12-23 NOTE — Telephone Encounter (Signed)
Informed patient his VS are good and no changes are being made. Patient grateful for call.

## 2015-11-24 ENCOUNTER — Other Ambulatory Visit: Payer: Self-pay | Admitting: Cardiology

## 2015-12-01 ENCOUNTER — Other Ambulatory Visit: Payer: Self-pay | Admitting: Cardiology

## 2015-12-15 ENCOUNTER — Ambulatory Visit: Payer: Medicare Other | Admitting: Cardiology

## 2015-12-22 ENCOUNTER — Ambulatory Visit (HOSPITAL_COMMUNITY): Payer: Medicare Other | Attending: Cardiology

## 2015-12-22 ENCOUNTER — Other Ambulatory Visit: Payer: Self-pay

## 2015-12-22 DIAGNOSIS — I1310 Hypertensive heart and chronic kidney disease without heart failure, with stage 1 through stage 4 chronic kidney disease, or unspecified chronic kidney disease: Secondary | ICD-10-CM | POA: Insufficient documentation

## 2015-12-22 DIAGNOSIS — I340 Nonrheumatic mitral (valve) insufficiency: Secondary | ICD-10-CM | POA: Diagnosis not present

## 2015-12-22 DIAGNOSIS — N189 Chronic kidney disease, unspecified: Secondary | ICD-10-CM | POA: Insufficient documentation

## 2015-12-22 DIAGNOSIS — I272 Pulmonary hypertension, unspecified: Secondary | ICD-10-CM | POA: Diagnosis not present

## 2015-12-22 DIAGNOSIS — I350 Nonrheumatic aortic (valve) stenosis: Secondary | ICD-10-CM

## 2015-12-23 ENCOUNTER — Ambulatory Visit (INDEPENDENT_AMBULATORY_CARE_PROVIDER_SITE_OTHER): Payer: Medicare Other | Admitting: Cardiology

## 2015-12-23 ENCOUNTER — Encounter: Payer: Self-pay | Admitting: Cardiology

## 2015-12-23 VITALS — BP 148/86 | HR 57 | Ht 66.0 in | Wt 152.8 lb

## 2015-12-23 DIAGNOSIS — I10 Essential (primary) hypertension: Secondary | ICD-10-CM | POA: Diagnosis not present

## 2015-12-23 DIAGNOSIS — I4819 Other persistent atrial fibrillation: Secondary | ICD-10-CM

## 2015-12-23 DIAGNOSIS — I350 Nonrheumatic aortic (valve) stenosis: Secondary | ICD-10-CM

## 2015-12-23 DIAGNOSIS — R001 Bradycardia, unspecified: Secondary | ICD-10-CM

## 2015-12-23 DIAGNOSIS — I272 Pulmonary hypertension, unspecified: Secondary | ICD-10-CM

## 2015-12-23 DIAGNOSIS — I481 Persistent atrial fibrillation: Secondary | ICD-10-CM

## 2015-12-23 HISTORY — DX: Pulmonary hypertension, unspecified: I27.20

## 2015-12-23 MED ORDER — ATENOLOL 25 MG PO TABS: 25.0000 mg | ORAL_TABLET | Freq: Every day | ORAL | 3 refills | Status: DC

## 2015-12-23 MED ORDER — AMLODIPINE BESYLATE 5 MG PO TABS: 5.0000 mg | ORAL_TABLET | Freq: Every day | ORAL | 3 refills | Status: DC

## 2015-12-23 MED ORDER — LOSARTAN POTASSIUM 100 MG PO TABS: 100.0000 mg | ORAL_TABLET | Freq: Every day | ORAL | 3 refills | Status: DC

## 2015-12-23 NOTE — Progress Notes (Signed)
Cardiology Office Note    Date:  12/23/2015   ID:  Alexander Barnett, DOB 1920-11-27, MRN [id number]  PCP:  Alessandra Grout, MD (Inactive)  Cardiologist:  Fransico Him, MD   Chief Complaint  Patient presents with  . Atrial Fibrillation  . Hypertension  . Aortic Stenosis    History of Present Illness:  Alexander Barnett is a 82 y.o. male with a history of PAF, asymptomatic bradycardia,mild AS and HTN who presents today for followup. He is doing well. He denies any chest pain, SOB, DOE, LE edema, dizziness, palpitations or syncope.   Past Medical History:  Diagnosis Date  . Anemia   . Aortic stenosis    moderate by echo 12/2015  . Arthritis   . BPH (benign prostatic hyperplasia)   . Bradycardia   . CKD (chronic kidney disease) stage 3, GFR 30-59 ml/min   . Diabetes mellitus without complication ([place])    prediabetes diet controlled  . Diastolic dysfunction   . Diverticulosis of colon   . Erectile dysfunction   . Hematuria   . History of kidney cancer   . History of skin cancer   . Hx of transfusion   . Hypertension   . Persistent atrial fibrillation ([place]) 06/2006   refused anticoagulation  . Prostate cancer ([place])   . Pulmonary HTN 12/23/2015   Moderate with PASP 70mmHg  . Transitional cell carcinoma of bladder ([facility])    and renal pelvis    Past Surgical History:  Procedure Laterality Date  . CYSTOSCOPY N/A 06/24/2012   Procedure: CYSTOSCOPY;  Surgeon: Dutch Gray, MD;  [facility];  Service: Urology;  Laterality: N/A;  . CYSTOSCOPY N/A 08/05/2012   Procedure: CYSTOSCOPY;  Surgeon: Dutch Gray, MD;  [facility];  Service: Urology;  Laterality: N/A;  CYSTO, CLOT EVACUATION, FULGERATION OF BLEEDING AREAS AT BLADDER NECK AND PROSTATIC URETHRA    . EYE SURGERY  02/2004   bil.cataract w/ lens implant  . HERNIA REPAIR  05/2002   right  . hyperbaric treatments     40 treatments for prostate-to try to stop bleeding  . imrt radiation  04/2006   for prostate cancer    . MOHS SURGERY  02/2002   nose  . NEPHRECTOMY  03/2007   left  . PROSTATE SURGERY  12/2003  . PROSTATE SURGERY  03/1998   surgery in prostate area  . targus  04/1998   to prostate  . TONSILLECTOMY     as child  . TRANSURETHRAL RESECTION OF BLADDER TUMOR N/A 03/29/2014   Procedure: CYSTOSCOPY, CLOT EVACUATION, RIGHT RETROGRADE, TRANSURETHRAL RESECTION OF BLADDER TUMOR (TURBT);  Surgeon: Ardis Hughs, MD;  Location[facility];  Service: Urology;  Laterality: N/A;  . TRANSURETHRAL RESECTION OF PROSTATE N/A 06/24/2012   Procedure: TRANSURETHRAL RESECTION OF THE PROSTATE (TURP);  Surgeon: Dutch Gray, MD;  Location[facility];  Service: Urology;  Laterality: N/A;  CYSTOSCOPY, TRANSURETHRAL RESECTION OF PROSTATIC URETHRAL TUMOR   . URINARY SURGERY  03/1998   bleeding    Current Medications: Outpatient Medications Prior to Visit  Medication Sig Dispense Refill  . amLODipine (NORVASC) 5 MG tablet TAKE 1 TABLET BY MOUTH EVERY DAY 90 tablet 0  . atenolol (TENORMIN) 25 MG tablet TAKE 1 TABLET (25 MG TOTAL) BY MOUTH DAILY. 90 tablet 0  . cholecalciferol (VITAMIN D) 1000 UNITS tablet Take 1,000 Units by mouth daily.    . Coenzyme Q10 (CO Q-10) 100 MG CAPS Take 1 capsule by mouth daily.     Marland Kitchen  finasteride (PROSCAR) 5 MG tablet Take 5 mg by mouth every morning.     Marland Kitchen losartan (COZAAR) 100 MG tablet Take 1 tablet (100 mg total) by mouth daily. 90 tablet 3  . Multiple Vitamin (MULTIVITAMIN WITH MINERALS) TABS Take 1 tablet by mouth daily.    . travoprost, benzalkonium, (TRAVATAN) 0.004 % ophthalmic solution Place 1 drop into both eyes at bedtime.     No facility-administered medications prior to visit.      Allergies:   Patient has no known allergies.   Social History   Social History  . Marital status: Widowed    Spouse name: N/A  . Number of children: N/A  . Years of education: N/A   Social History Main Topics  . Smoking status: Former Smoker    Packs/day: 1.50    Years: 20.00     Types: Cigarettes    Quit date: 06/21/1962  . Smokeless tobacco: Never Used  . Alcohol use 0.6 oz/week    1 Glasses of wine per week     Comment: glass wine  occassionally  . Drug use: No  . Sexual activity: No   Other Topics Concern  . None   Social History Narrative  . None     Family History:  The patient's family history is not on file.   ROS:   Please see the history of present illness.    ROS All other systems reviewed and are negative.  No flowsheet data found.     PHYSICAL EXAM:   VS:  BP (!) 148/86   Pulse (!) 57   Ht 5\' 6"  (1.676 m)   Wt 152 lb 12.8 oz (69.3 kg)   BMI 24.66 kg/m    GEN: Well nourished, well developed, in no acute distress  HEENT: normal  Neck: no JVD, carotid bruits, or masses Cardiac: RRR; no rubs or gallops,no edema.  Intact distal pulses bilaterally. 2/6 SM at RUSB to LLSB Respiratory:  clear to auscultation bilaterally, normal work of breathing GI: soft, nontender, nondistended, + BS MS: no deformity or atrophy  Skin: warm and dry, no rash Neuro:  Alert and Oriented x 3, Strength and sensation are intact Psych: euthymic mood, full affect  Wt Readings from Last 3 Encounters:  12/23/15 152 lb 12.8 oz (69.3 kg)  12/08/14 154 lb 6.4 oz (70 kg)  03/26/14 156 lb 8 oz (71 kg)      Studies/Labs Reviewed:   EKG:  EKG is  ordered today.  The ekg ordered today demonstrates sinus bradycardia 57bpm with no ST changes  Recent Labs: No results found for requested labs within last 8760 hours.   Lipid Panel    Component Value Date/Time   CHOL  07/11/2006 0650    186        ATP III CLASSIFICATION:  <200     mg/dL   Desirable  200-239  mg/dL   Borderline High  >=240    mg/dL   High   TRIG 46 07/11/2006 0650   HDL 61 07/11/2006 0650   CHOLHDL 3.0 07/11/2006 0650   VLDL 9 07/11/2006 0650   LDLCALC (H) 07/11/2006 0650    116        Total Cholesterol/HDL:CHD Risk Coronary Heart Disease Risk Table                     Men   Women  1/2  Average Risk   3.4   3.3    Additional studies/ records that  were reviewed today include:  none    ASSESSMENT:    1. Persistent atrial fibrillation ([place])   2. Essential hypertension   3. Aortic valve stenosis, etiology of cardiac valve disease unspecified   4. Bradycardia   5. Pulmonary HTN      PLAN:  In order of problems listed above:  1. Persistent atrial fibrillation maintaining NSR.  Continue BB.  Patient refused anticoagulation  2. HTN - BP controlled on current meds.  Continue amlodipine/BB and ARB. 3. Moderate AS by echo yesterday.  He is asymptomatic. Repeat echo in 1 year 4. Asymptomatic bradycardia 5. Moderate pulmonary HTN most likely secondary to AS.  He has not SOB or edema. Will continue to follow.      Medication Adjustments/Labs and Tests Ordered: Current medicines are reviewed at length with the patient today.  Concerns regarding medicines are outlined above.  Medication changes, Labs and Tests ordered today are listed in the Patient Instructions below.  There are no Patient Instructions on file for this visit.   Signed, Fransico Him, MD  12/23/2015 10:56 AM    [facility] [place], [place],   [address] Phone: [phone]; Fax: [phone]

## 2015-12-23 NOTE — Patient Instructions (Signed)
Medication Instructions:  Your physician recommends that you continue on your current medications as directed. Please refer to the Current Medication list given to you today.   Labwork: None  Testing/Procedures: Your physician has requested that you have an echocardiogram in one year. Echocardiography is a painless test that uses sound waves to create images of your heart. It provides your doctor with information about the size and shape of your heart and how well your heart's chambers and valves are working. This procedure takes approximately one hour. There are no restrictions for this procedure.  Follow-Up: Your physician wants you to follow-up in: 1 year with Dr. Radford Pax. You will receive a reminder letter in the mail two months in advance. If you don't receive a letter, please call our office to schedule the follow-up appointment.   Any Other Special Instructions Will Be Listed Below (If Applicable).     If you need a refill on your cardiac medications before your next appointment, please call your pharmacy.

## 2016-03-09 ENCOUNTER — Other Ambulatory Visit: Payer: Self-pay | Admitting: Gastroenterology

## 2016-03-09 DIAGNOSIS — R1084 Generalized abdominal pain: Secondary | ICD-10-CM

## 2016-03-09 DIAGNOSIS — D509 Iron deficiency anemia, unspecified: Secondary | ICD-10-CM

## 2016-03-10 ENCOUNTER — Ambulatory Visit
Admission: RE | Admit: 2016-03-10 | Discharge: 2016-03-10 | Disposition: A | Discharge status: Home / Self Care | Payer: Medicare Other | Source: Ambulatory Visit | Attending: Gastroenterology | Admitting: Gastroenterology

## 2016-03-10 DIAGNOSIS — R1084 Generalized abdominal pain: Secondary | ICD-10-CM

## 2016-03-10 DIAGNOSIS — D509 Iron deficiency anemia, unspecified: Secondary | ICD-10-CM

## 2016-05-31 ENCOUNTER — Telehealth: Payer: Self-pay | Admitting: Hematology

## 2016-05-31 ENCOUNTER — Encounter: Payer: Self-pay | Admitting: Hematology

## 2016-05-31 NOTE — Telephone Encounter (Signed)
Received a call from [facility] to schedule the pt to see Dr. Irene Limbo on 6/14 at 11am. She will notify the pt. Letter mailed.

## 2016-06-29 ENCOUNTER — Ambulatory Visit (HOSPITAL_BASED_OUTPATIENT_CLINIC_OR_DEPARTMENT_OTHER): Payer: Medicare Other

## 2016-06-29 ENCOUNTER — Ambulatory Visit (HOSPITAL_BASED_OUTPATIENT_CLINIC_OR_DEPARTMENT_OTHER): Payer: Medicare Other | Admitting: Hematology

## 2016-06-29 ENCOUNTER — Encounter: Payer: Self-pay | Admitting: Hematology

## 2016-06-29 ENCOUNTER — Telehealth: Payer: Self-pay | Admitting: Hematology

## 2016-06-29 VITALS — BP 145/60 | HR 61 | Temp 98.7°F | Resp 20 | Ht 66.0 in | Wt 146.7 lb

## 2016-06-29 DIAGNOSIS — D649 Anemia, unspecified: Secondary | ICD-10-CM

## 2016-06-29 LAB — COMPREHENSIVE METABOLIC PANEL
ALBUMIN: 3.9 g/dL (ref 3.5–5.0)
ALK PHOS: 67 U/L (ref 40–150)
ALT: 16 U/L (ref 0–55)
AST: 30 U/L (ref 5–34)
Anion Gap: 12 mEq/L — ABNORMAL HIGH (ref 3–11)
BILIRUBIN TOTAL: 0.92 mg/dL (ref 0.20–1.20)
BUN: 23.2 mg/dL (ref 7.0–26.0)
CO2: 22 mEq/L (ref 22–29)
CREATININE: 1.5 mg/dL — AB (ref 0.7–1.3)
Calcium: 9.4 mg/dL (ref 8.4–10.4)
Chloride: 107 mEq/L (ref 98–109)
EGFR: 40 mL/min/{1.73_m2} — ABNORMAL LOW (ref >90)
GLUCOSE: 87 mg/dL (ref 70–140)
POTASSIUM: 5.1 meq/L (ref 3.5–5.1)
Sodium: 141 mEq/L (ref 136–145)
Total Protein: 7.7 g/dL (ref 6.4–8.3)

## 2016-06-29 LAB — CBC & DIFF AND RETIC
BASO%: 2.2 % — ABNORMAL HIGH (ref 0.0–2.0)
BASOS ABS: 0.2 10*3/uL — AB (ref 0.0–0.1)
EOS ABS: 0.2 10*3/uL (ref 0.0–0.5)
EOS%: 2.5 % (ref 0.0–7.0)
HEMATOCRIT: 28.1 % — AB (ref 38.4–49.9)
HGB: 9.3 g/dL — ABNORMAL LOW (ref 13.0–17.1)
IMMATURE RETIC FRACT: 11.9 % — AB (ref 3.00–10.60)
LYMPH%: 17.6 % (ref 14.0–49.0)
MCH: 29.8 pg (ref 27.2–33.4)
MCHC: 33.1 g/dL (ref 32.0–36.0)
MCV: 90.1 fL (ref 79.3–98.0)
MONO#: 0.7 10*3/uL (ref 0.1–0.9)
MONO%: 8.3 % (ref 0.0–14.0)
NEUT%: 69.4 % (ref 39.0–75.0)
NEUTROS ABS: 5.9 10*3/uL (ref 1.5–6.5)
Platelets: 205 10*3/uL (ref 140–400)
RBC: 3.12 10*6/uL — ABNORMAL LOW (ref 4.20–5.82)
RDW: 20.4 % — AB (ref 11.0–14.6)
RETIC %: 1.02 % (ref 0.80–1.80)
Retic Ct Abs: 31.82 10*3/uL — ABNORMAL LOW (ref 34.80–93.90)
WBC: 8.5 10*3/uL (ref 4.0–10.3)
lymph#: 1.5 10*3/uL (ref 0.9–3.3)

## 2016-06-29 LAB — CHCC SMEAR

## 2016-06-29 LAB — TECHNOLOGIST REVIEW

## 2016-06-29 LAB — LACTATE DEHYDROGENASE: LDH: 180 U/L (ref 125–245)

## 2016-06-29 LAB — IRON AND TIBC
%SAT: 54 % (ref 20–55)
IRON: 152 ug/dL (ref 42–163)
TIBC: 283 ug/dL (ref 202–409)
UIBC: 130 ug/dL (ref 117–376)

## 2016-06-29 LAB — FERRITIN: Ferritin: 217 ng/ml (ref 22–316)

## 2016-06-29 NOTE — Patient Instructions (Signed)
Thank you for choosing [facility] to provide your oncology and hematology care.  To afford each patient quality time with our providers, please arrive 30 minutes before your scheduled appointment time.  If you arrive late for your appointment, you may be asked to reschedule.  We strive to give you quality time with our providers, and arriving late affects you and other patients whose appointments are after yours.  If you are a no show for multiple scheduled visits, you may be dismissed from the clinic at the providers discretion.   Again, thank you for choosing [facility], our hope is that these requests will decrease the amount of time that you wait before being seen by our physicians.  ______________________________________________________________________ Should you have questions after your visit to the [facility], please contact our office at [phone] between the hours of 8:30 and 4:30 p.m.    Voicemails left after 4:30p.m will not be returned until the following business day.   For prescription refill requests, please have your pharmacy contact us directly.  Please also try to allow 48 hours for prescription requests.   Please contact the scheduling department for questions regarding scheduling.  For scheduling of procedures such as PET scans, CT scans, MRI, Ultrasound, etc please contact central scheduling at [phone].   Resources For Cancer Patients and Caregivers:  American Cancer Society:  800-227-2345  Can help patients locate various types of support and financial assistance Cancer Care: 1-800-813-HOPE (4673) Provides financial assistance, online support groups, medication/co-pay assistance.   [place] DSS:  336-641-3447 Where to apply for food stamps, Medicaid, and utility assistance Medicare Rights Center: 800-333-4114 Helps people with Medicare understand their rights and benefits, navigate the Medicare system, and secure the  quality healthcare they deserve SCAT: 336-333-6589 [place] [employer]'s shared-ride transportation service for eligible riders who have a disability that prevents them from riding the fixed route bus.   For additional information on assistance programs please contact our social worker:   Grier Hock/Abigail Elmore:  [phone] 

## 2016-06-29 NOTE — Telephone Encounter (Signed)
Scheduled appt per 6/14 los - Gave patient AVS and caelnder per LOS .

## 2016-06-29 NOTE — Progress Notes (Signed)
Marland Kitchen    HEMATOLOGY/ONCOLOGY CONSULTATION NOTE  Date of Service: 06/29/2016  Patient Care Team: Alessandra Grout, MD (Inactive) as PCP - General (Family Medicine)  CHIEF COMPLAINTS/PURPOSE OF CONSULTATION:  Anemia  HISTORY OF PRESENTING ILLNESS:   Alexander Barnett is a wonderful 83 y.o. male who has been referred to [place] by Dr .Alessandra Grout, MD (Inactive) for evaluation and management of Anemia.  Patient has multiple medical comorbidities as noted below. Has a history of aortic stenosis, atrial fibrillation not on anticoagulation, diabetes, kidney cancer, bladder cancer, prostate cancer (follows with urologist Dr. Alinda Money) , Pulmonary hypertension, chronic kidney disease.  Patient notes that he had no hematuria for 2 years but has noted some hematuria recently. Previously had more issues with radiation proctitis with some rectal bleeding. Has been on ferrous sulfate 325 mg by mouth daily for the last 4-5 months. CT abdomen pelvis on 03/10/2016 showed no acute findings within the abdomen or pelvis and showed status post left nephrectomy status. Had Colonoscopy done on 05/17/2016 by Dr. Daisey Must showed external/internal hemorrhoids, altered vascular mucosa in the rectum consistent with radiation proctitis which was thought to be the cause of the heme-positive stools. Diverticulosis. There was a plan to consider a capsule endoscopy is needed.   review labs recently show that his hemoglobin has been in the 9-10 range for the last year or more and occasionally up to 11. Patient notes no recent PRBC transfusions. His last iron levels on 04/28/2016 showed a ferritin of 118.5. We discussed possibilities of his anemia be multifactorial. He notes that he would not want a very aggressive workup and would not want to pursue bone marrow evaluation unless absolutely necessary. Patient notes that he has scheduled follow-up with Dr. Alinda Money for a repeat screening cystoscopy in the near future.   MEDICAL HISTORY:    Past Medical History:  Diagnosis Date  . Anemia   . Aortic stenosis    moderate by echo 12/2015  . Arthritis    Fingers  . BPH (benign prostatic hyperplasia)   . Bradycardia   . CKD (chronic kidney disease) stage 3, GFR 30-59 ml/min   . Diabetes mellitus without complication ([place])    prediabetes diet controlled  . Diastolic dysfunction   . Diverticulosis of colon   . Erectile dysfunction   . Hematuria   . History of kidney cancer   . History of skin cancer   . Hx of transfusion   . Hypertension   . Persistent atrial fibrillation ([place]) 06/2006   refused anticoagulation  . Prostate cancer ([place])   . Pulmonary HTN ([place]) 12/23/2015   Moderate with PASP 42mHg  . Transitional cell carcinoma of bladder ([facility])    and renal pelvis    SURGICAL HISTORY: Past Surgical History:  Procedure Laterality Date  . CYSTOSCOPY N/A 06/24/2012   Procedure: CYSTOSCOPY;  Surgeon: LDutch Gray MD;  Location: [facility];  Service: Urology;  Laterality: N/A;  . CYSTOSCOPY N/A 08/05/2012   Procedure: CYSTOSCOPY;  Surgeon: LDutch Gray MD;  Location: [facility];  Service: Urology;  Laterality: N/A;  CYSTO, CLOT EVACUATION, FULGERATION OF BLEEDING AREAS AT BLADDER NECK AND PROSTATIC URETHRA    . EYE SURGERY  02/2004   bil.cataract w/ lens implant  . HERNIA REPAIR  05/2002   right  . hyperbaric treatments     40 treatments for prostate-to try to stop bleeding  . imrt radiation  04/2006   for prostate cancer  . MOHS SURGERY  02/2002   nose  . NEPHRECTOMY  03/2007   left  . PROSTATE SURGERY  12/2003  . PROSTATE SURGERY  03/1998   surgery in prostate area  . targus  04/1998   to prostate  . TONSILLECTOMY     as child  . TRANSURETHRAL RESECTION OF BLADDER TUMOR N/A 03/29/2014   Procedure: CYSTOSCOPY, CLOT EVACUATION, RIGHT RETROGRADE, TRANSURETHRAL RESECTION OF BLADDER TUMOR (TURBT);  Surgeon: Ardis Hughs, MD;  Location: [facility];  Service: Urology;  Laterality: N/A;  . TRANSURETHRAL RESECTION OF  PROSTATE N/A 06/24/2012   Procedure: TRANSURETHRAL RESECTION OF THE PROSTATE (TURP);  Surgeon: Dutch Gray, MD;  Location: [facility];  Service: Urology;  Laterality: N/A;  CYSTOSCOPY, TRANSURETHRAL RESECTION OF PROSTATIC URETHRAL TUMOR   . URINARY SURGERY  03/1998   bleeding    SOCIAL HISTORY: Social History   Social History  . Marital status: Widowed    Spouse name: N/A  . Number of children: N/A  . Years of education: N/A   Occupational History  . Not on file.   Social History Main Topics  . Smoking status: Former Smoker    Packs/day: 1.50    Years: 20.00    Types: Cigarettes    Quit date: 06/21/1962  . Smokeless tobacco: Never Used  . Alcohol use 2.4 oz/week    4 Glasses of wine per week     Comment: glass wine  occassionally  . Drug use: No  . Sexual activity: No   Other Topics Concern  . Not on file   Social History Narrative  . No narrative on file    FAMILY HISTORY: History reviewed. No pertinent family history.  ALLERGIES:  is allergic to amoxicillin and ciprofloxacin.  MEDICATIONS:  Current Outpatient Prescriptions  Medication Sig Dispense Refill  . ferrous sulfate 325 (65 FE) MG tablet Take 325 mg by mouth daily with breakfast.    . amLODipine (NORVASC) 5 MG tablet Take 1 tablet (5 mg total) by mouth daily. 90 tablet 3  . atenolol (TENORMIN) 25 MG tablet Take 1 tablet (25 mg total) by mouth daily. 90 tablet 3  . cholecalciferol (VITAMIN D) 1000 UNITS tablet Take 1,000 Units by mouth daily.    . Coenzyme Q10 (CO Q-10) 100 MG CAPS Take 1 capsule by mouth daily.     . finasteride (PROSCAR) 5 MG tablet Take 5 mg by mouth every morning.     Marland Kitchen losartan (COZAAR) 100 MG tablet Take 1 tablet (100 mg total) by mouth daily. 90 tablet 3  . Multiple Vitamin (MULTIVITAMIN WITH MINERALS) TABS Take 1 tablet by mouth daily.    . travoprost, benzalkonium, (TRAVATAN) 0.004 % ophthalmic solution Place 1 drop into both eyes at bedtime.     No current facility-administered  medications for this visit.     REVIEW OF SYSTEMS:    10 Point review of Systems was done is negative except as noted above.  PHYSICAL EXAMINATION: ECOG PERFORMANCE STATUS: 2 - Symptomatic, <50% confined to bed  . Vitals:   06/29/16 1032  BP: (!) 145/60  Pulse: 61  Resp: 20  Temp: 98.7 F (37.1 C)   Filed Weights   06/29/16 1032  Weight: 146 lb 11.2 oz (66.5 kg)   .Body mass index is 23.68 kg/m.  GENERAL:alert, in no acute distress and comfortable SKIN: no acute rashes, no significant lesions EYES: conjunctiva are pink and non-injected, sclera anicteric OROPHARYNX: MMM, no exudates, no oropharyngeal erythema or ulceration NECK: supple, no JVD LYMPH:  no palpable lymphadenopathy in the cervical, axillary or inguinal  regions LUNGS: clear to auscultation b/l with normal respiratory effort HEART Irregular heart rate EN:  normoactive bowel sounds , non tender, not distended.No palpable hepatosplenomegaly  Extremity: no pedal edema PSYCH: alert & oriented x 3 with fluent speech NEURO: no focal motor/sensory deficits  LABORATORY DATA:  I have reviewed the data as listed  . CBC Latest Ref Rng & Units 06/29/2016 06/29/2016  WBC 4.0 - 10.3 10e3/uL 8.5 -  Hemoglobin 13.0 - 17.1 g/dL 9.3(L) -  Hematocrit 38.4 - 49.9 % 28.1(L) 29.2(L)  Platelets 140 - 400 10e3/uL 205 -    CMP Latest Ref Rng & Units 06/29/2016 06/29/2016  Glucose 70 - 140 mg/dl 87 -  BUN 7.0 - 26.0 mg/dL 23.2 -  Creatinine 0.7 - 1.3 mg/dL 1.5(H) -  Sodium 136 - 145 mEq/L 141 -  Potassium 3.5 - 5.1 mEq/L 5.1 -  Chloride 96 - 112 mmol/L - -  CO2 22 - 29 mEq/L 22 -  Calcium 8.4 - 10.4 mg/dL 9.4 -  Total Protein 6.4 - 8.3 g/dL 7.7 7.2  Total Bilirubin 0.20 - 1.20 mg/dL 0.92 -  Alkaline Phos 40 - 150 U/L 67 -  AST 5 - 34 U/L 30 -  ALT 0 - 55 U/L 16 -    RADIOGRAPHIC STUDIES: I have personally reviewed the radiological images as listed and agreed with the findings in the report. No results  found.  ASSESSMENT & PLAN:   83 year old gentleman with multiple medical comorbidities with   #1 Chronic normocytic anemia with a hemoglobin in the 9-10 range. This is likely multifactorial Has had heme positive stools which in colonoscopy were thought to be related to radiation proctitis. This could likely cause slow continuous losses. He also has had issues with hematuria which are being monitored by Dr. Alinda Money in the setting of his history of kidney cancer bladder cancer and prostate cancer. At his age one would expect at least some element of MDS. Patient is not keen to do a bone marrow examination. Peripheral blood does reveal some changes suggestive of MDS. Part of his anemia is also likely related to anemia of chronic kidney disease and the fact that he has only a single kidney. We'll rule out low-level hemolysis related to his aortic stenosis. Plan -Possible etiologies of his anemia were discussed in details with the patient. -This consent an extensive laboratory workup was sent out to evaluate potential etiologies as noted below. -Normal LDH level suggests against overt hemolysis -Iron profile suggests that his ferritin level is 217 and his iron has been adequately replaced. -No indications for IV iron at this time but the goals with his chronic kidney disease would be maintaining a ferritin level of at least 100 or above. -Patient is not keen to pursue bone marrow examination to further define the possibility of MDS. -He shall be continued to follow with urology for appropriate follow-up screening for his RCC, bladder cancer and prostate cancer. -Myeloma panel was done And shows no evidence of a paraproteinemia. -Erythropoietin levels were noted to be somewhat low at 12.7 considering his anemia. -B12 and folate levels within normal limits  . Orders Placed This Encounter  Procedures  . CBC & Diff and Retic    Standing Status:   Future    Number of Occurrences:   1    Standing  Expiration Date:   06/29/2017  . Comprehensive metabolic panel    Standing Status:   Future    Number of Occurrences:   1  Standing Expiration Date:   06/29/2017  . Smear    Standing Status:   Future    Number of Occurrences:   1    Standing Expiration Date:   06/29/2017  . Lactate dehydrogenase    Standing Status:   Future    Number of Occurrences:   1    Standing Expiration Date:   06/29/2017  . Haptoglobin    Standing Status:   Future    Number of Occurrences:   1    Standing Expiration Date:   06/29/2017  . Ferritin    Standing Status:   Future    Number of Occurrences:   1    Standing Expiration Date:   06/29/2017  . Iron and TIBC    Standing Status:   Future    Number of Occurrences:   1    Standing Expiration Date:   06/29/2017  . Vitamin B12    Standing Status:   Future    Number of Occurrences:   1    Standing Expiration Date:   06/29/2017  . Folate RBC    Standing Status:   Future    Number of Occurrences:   1    Standing Expiration Date:   06/29/2017  . Erythropoietin    Standing Status:   Future    Number of Occurrences:   1    Standing Expiration Date:   06/29/2017  . Sedimentation rate    Standing Status:   Future    Number of Occurrences:   1    Standing Expiration Date:   06/29/2017  . Multiple Myeloma Panel (SPEP&IFE w/QIG)    Standing Status:   Future    Number of Occurrences:   1    Standing Expiration Date:   06/29/2017  . Kappa/lambda light chains    Standing Status:   Future    Number of Occurrences:   1    Standing Expiration Date:   08/03/2017   Labs today RTC with Dr Irene Limbo in about 1 month with labs  All of the patients questions were answered with apparent satisfaction. The patient knows to call the clinic with any problems, questions or concerns.  I spent 40 minutes counseling the patient face to face. The total time spent in the appointment was 60 minutes and more than 50% was on counseling and direct patient cares.    Sullivan Lone MD [place]  AAHIVMS [facility] Hematology/Oncology Physician [facility]  (Office):       [phone] (Work cell):  [phone] (Fax):           [phone]  06/29/2016 11:03 AM

## 2016-06-30 LAB — FOLATE RBC
Folate, Hemolysate: 448.8 ng/mL
Hematocrit: 29.2 % — ABNORMAL LOW (ref 37.5–51.0)

## 2016-06-30 LAB — VITAMIN B12: Vitamin B12: 519 pg/mL (ref 232–1245)

## 2016-06-30 LAB — ERYTHROPOIETIN: Erythropoietin: 12.7 m[IU]/mL (ref 2.6–18.5)

## 2016-06-30 LAB — KAPPA/LAMBDA LIGHT CHAINS
IG KAPPA FREE LIGHT CHAIN: 48.7 mg/L — AB (ref 3.3–19.4)
IG LAMBDA FREE LIGHT CHAIN: 49.4 mg/L — AB (ref 5.7–26.3)
KAPPA/LAMBDA FLC RATIO: 0.99 (ref 0.26–1.65)

## 2016-06-30 LAB — SEDIMENTATION RATE: Sedimentation Rate-Westergren: 22 mm/hr (ref 0–30)

## 2016-06-30 LAB — HAPTOGLOBIN: HAPTOGLOBIN: 81 mg/dL (ref 34–200)

## 2016-07-03 LAB — MULTIPLE MYELOMA PANEL, SERUM
ALBUMIN SERPL ELPH-MCNC: 3.8 g/dL (ref 2.9–4.4)
ALBUMIN/GLOB SERPL: 1.2 (ref 0.7–1.7)
Alpha 1: 0.3 g/dL (ref 0.0–0.4)
Alpha2 Glob SerPl Elph-Mcnc: 0.6 g/dL (ref 0.4–1.0)
B-GLOBULIN SERPL ELPH-MCNC: 1 g/dL (ref 0.7–1.3)
GAMMA GLOB SERPL ELPH-MCNC: 1.6 g/dL (ref 0.4–1.8)
GLOBULIN, TOTAL: 3.4 g/dL (ref 2.2–3.9)
IgA, Qn, Serum: 373 mg/dL (ref 61–437)
IgG, Qn, Serum: 1430 mg/dL (ref 700–1600)
IgM, Qn, Serum: 91 mg/dL (ref 15–143)
Total Protein: 7.2 g/dL (ref 6.0–8.5)

## 2016-08-01 ENCOUNTER — Other Ambulatory Visit: Payer: Self-pay | Admitting: *Deleted

## 2016-08-02 ENCOUNTER — Telehealth: Payer: Self-pay | Admitting: Hematology

## 2016-08-02 ENCOUNTER — Other Ambulatory Visit (HOSPITAL_BASED_OUTPATIENT_CLINIC_OR_DEPARTMENT_OTHER): Payer: Medicare Other

## 2016-08-02 ENCOUNTER — Encounter: Payer: Self-pay | Admitting: Hematology

## 2016-08-02 ENCOUNTER — Ambulatory Visit (HOSPITAL_BASED_OUTPATIENT_CLINIC_OR_DEPARTMENT_OTHER): Payer: Medicare Other | Admitting: Hematology

## 2016-08-02 ENCOUNTER — Other Ambulatory Visit: Payer: Self-pay | Admitting: *Deleted

## 2016-08-02 VITALS — BP 174/71 | HR 70 | Temp 98.2°F | Resp 18 | Ht 66.0 in | Wt 147.2 lb

## 2016-08-02 DIAGNOSIS — D649 Anemia, unspecified: Secondary | ICD-10-CM | POA: Diagnosis not present

## 2016-08-02 DIAGNOSIS — D509 Iron deficiency anemia, unspecified: Secondary | ICD-10-CM

## 2016-08-02 LAB — CBC WITH DIFFERENTIAL/PLATELET
BASO%: 2.3 % — ABNORMAL HIGH (ref 0.0–2.0)
Basophils Absolute: 0.2 10*3/uL — ABNORMAL HIGH (ref 0.0–0.1)
EOS ABS: 0.1 10*3/uL (ref 0.0–0.5)
EOS%: 1.9 % (ref 0.0–7.0)
HCT: 26.9 % — ABNORMAL LOW (ref 38.4–49.9)
HEMOGLOBIN: 9.1 g/dL — AB (ref 13.0–17.1)
LYMPH#: 1.3 10*3/uL (ref 0.9–3.3)
LYMPH%: 18.3 % (ref 14.0–49.0)
MCH: 30.6 pg (ref 27.2–33.4)
MCHC: 33.8 g/dL (ref 32.0–36.0)
MCV: 90.6 fL (ref 79.3–98.0)
MONO#: 1 10*3/uL — AB (ref 0.1–0.9)
MONO%: 14.6 % — AB (ref 0.0–14.0)
NEUT%: 62.9 % (ref 39.0–75.0)
NEUTROS ABS: 4.4 10*3/uL (ref 1.5–6.5)
Platelets: 247 10*3/uL (ref 140–400)
RBC: 2.97 10*6/uL — ABNORMAL LOW (ref 4.20–5.82)
RDW: 20.7 % — ABNORMAL HIGH (ref 11.0–14.6)
WBC: 7 10*3/uL (ref 4.0–10.3)

## 2016-08-02 LAB — COMPREHENSIVE METABOLIC PANEL
ALBUMIN: 3.7 g/dL (ref 3.5–5.0)
ALK PHOS: 69 U/L (ref 40–150)
ALT: 15 U/L (ref 0–55)
AST: 23 U/L (ref 5–34)
Anion Gap: 10 mEq/L (ref 3–11)
BILIRUBIN TOTAL: 0.82 mg/dL (ref 0.20–1.20)
BUN: 18.3 mg/dL (ref 7.0–26.0)
CO2: 23 mEq/L (ref 22–29)
Calcium: 9.1 mg/dL (ref 8.4–10.4)
Chloride: 107 mEq/L (ref 98–109)
Creatinine: 1.4 mg/dL — ABNORMAL HIGH (ref 0.7–1.3)
EGFR: 42 mL/min/{1.73_m2} — AB (ref >90)
GLUCOSE: 114 mg/dL (ref 70–140)
Potassium: 4.5 mEq/L (ref 3.5–5.1)
SODIUM: 139 meq/L (ref 136–145)
TOTAL PROTEIN: 7 g/dL (ref 6.4–8.3)

## 2016-08-02 LAB — FERRITIN: Ferritin: 191 ng/ml (ref 22–316)

## 2016-08-02 NOTE — Telephone Encounter (Signed)
Gave patient avs report and appointments for September.  °

## 2016-08-02 NOTE — Patient Instructions (Signed)
Thank you for choosing [facility] to provide your oncology and hematology care.  To afford each patient quality time with our providers, please arrive 30 minutes before your scheduled appointment time.  If you arrive late for your appointment, you may be asked to reschedule.  We strive to give you quality time with our providers, and arriving late affects you and other patients whose appointments are after yours.   If you are a no show for multiple scheduled visits, you may be dismissed from the clinic at the providers discretion.    Again, thank you for choosing [facility], our hope is that these requests will decrease the amount of time that you wait before being seen by our physicians.  ______________________________________________________________________  Should you have questions after your visit to the [facility], please contact our office at [phone] between the hours of 8:30 and 4:30 p.m.    Voicemails left after 4:30p.m will not be returned until the following business day.    For prescription refill requests, please have your pharmacy contact us directly.  Please also try to allow 48 hours for prescription requests.    Please contact the scheduling department for questions regarding scheduling.  For scheduling of procedures such as PET scans, CT scans, MRI, Ultrasound, etc please contact central scheduling at [phone].    Resources For Cancer Patients and Caregivers:   Oncolink.org:  A wonderful resource for patients and healthcare providers for information regarding your disease, ways to tract your treatment, what to expect, etc.     American Cancer Society:  800-227-2345  Can help patients locate various types of support and financial assistance  Cancer Care: 1-800-813-HOPE (4673) Provides financial assistance, online support groups, medication/co-pay assistance.    [place] DSS:  336-641-3447 Where to apply for food  stamps, Medicaid, and utility assistance  Medicare Rights Center: 800-333-4114 Helps people with Medicare understand their rights and benefits, navigate the Medicare system, and secure the quality healthcare they deserve  SCAT: 336-333-6589 [employer] [employer]'s shared-ride transportation service for eligible riders who have a disability that prevents them from riding the fixed route bus.    For additional information on assistance programs please contact our social worker:   Grier Hock/Abigail Elmore:  [phone]            

## 2016-08-03 NOTE — Progress Notes (Signed)
Marland Kitchen    HEMATOLOGY/ONCOLOGY CLINIC NOTE  Date of Service: .08/02/2016  Patient Care Team: Alessandra Grout, MD (Inactive) as PCP - General (Family Medicine)  CHIEF COMPLAINTS/PURPOSE OF CONSULTATION:  Anemia  HISTORY OF PRESENTING ILLNESS:   Alexander Barnett is a wonderful 83 y.o. male who has been referred to [place] by Dr .Alessandra Grout, MD (Inactive) for evaluation and management of Anemia.  Patient has multiple medical comorbidities as noted below. Has a history of aortic stenosis, atrial fibrillation not on anticoagulation, diabetes, kidney cancer, bladder cancer, prostate cancer (follows with urologist Dr. Alinda Money) , Pulmonary hypertension, chronic kidney disease.  Patient notes that he had no hematuria for 2 years but has noted some hematuria recently. Previously had more issues with radiation proctitis with some rectal bleeding. Has been on ferrous sulfate 325 mg by mouth daily for the last 4-5 months. CT abdomen pelvis on 03/10/2016 showed no acute findings within the abdomen or pelvis and showed status post left nephrectomy status. Had Colonoscopy done on 05/17/2016 by Dr. Daisey Must showed external/internal hemorrhoids, altered vascular mucosa in the rectum consistent with radiation proctitis which was thought to be the cause of the heme-positive stools. Diverticulosis. There was a plan to consider a capsule endoscopy is needed.   review labs recently show that his hemoglobin has been in the 9-10 range for the last year or more and occasionally up to 11. Patient notes no recent PRBC transfusions. His last iron levels on 04/28/2016 showed a ferritin of 118.5. We discussed possibilities of his anemia be multifactorial. He notes that he would not want a very aggressive workup and would not want to pursue bone marrow evaluation unless absolutely necessary. Patient notes that he has scheduled follow-up with Dr. Alinda Money for a repeat screening cystoscopy in the near future.  INTERVAL HISTORY  Patient  is here for follow-up of his multifactorial anemia. He notes no overt bleeding since his last visit a month ago. Stools are dark due to oral iron. He notes that he feels about the same. Hemoglobin is relatively stable at 9.1. Has not needed any PRBC transfusions. We discussed the extensive workup results from labs done last time. We discussed that there is no indication for IV iron at this time. We talked about close observation versus further definitive workup to rule out MDS with bone marrow examination and also the consideration of possible use of EPO. Patient notes that he is quite clear that he would not want a bone marrow examination at this time. He wants to think about the EPO and wants to follow up in a couple of months with repeat labs prior to making a decision. No other acute new concerns. Has a follow-up this week with Dr. Alinda Money.    MEDICAL HISTORY:  Past Medical History:  Diagnosis Date  . Anemia   . Aortic stenosis    moderate by echo 12/2015  . Arthritis    Fingers  . BPH (benign prostatic hyperplasia)   . Bradycardia   . CKD (chronic kidney disease) stage 3, GFR 30-59 ml/min   . Diabetes mellitus without complication ([place])    prediabetes diet controlled  . Diastolic dysfunction   . Diverticulosis of colon   . Erectile dysfunction   . Hematuria   . History of kidney cancer   . History of skin cancer   . Hx of transfusion   . Hypertension   . Persistent atrial fibrillation ([place]) 06/2006   refused anticoagulation  . Prostate cancer ([place])   . Pulmonary HTN (  [place]) 12/23/2015   Moderate with PASP 61mmHg  . Transitional cell carcinoma of bladder ([facility])    and renal pelvis    SURGICAL HISTORY: Past Surgical History:  Procedure Laterality Date  . CYSTOSCOPY N/A 06/24/2012   Procedure: CYSTOSCOPY;  Surgeon: Dutch Gray, MD;  Location: [facility];  Service: Urology;  Laterality: N/A;  . CYSTOSCOPY N/A 08/05/2012   Procedure: CYSTOSCOPY;  Surgeon: Dutch Gray, MD;  Location:  [facility];  Service: Urology;  Laterality: N/A;  CYSTO, CLOT EVACUATION, FULGERATION OF BLEEDING AREAS AT BLADDER NECK AND PROSTATIC URETHRA    . EYE SURGERY  02/2004   bil.cataract w/ lens implant  . HERNIA REPAIR  05/2002   right  . hyperbaric treatments     40 treatments for prostate-to try to stop bleeding  . imrt radiation  04/2006   for prostate cancer  . MOHS SURGERY  02/2002   nose  . NEPHRECTOMY  03/2007   left  . PROSTATE SURGERY  12/2003  . PROSTATE SURGERY  03/1998   surgery in prostate area  . targus  04/1998   to prostate  . TONSILLECTOMY     as child  . TRANSURETHRAL RESECTION OF BLADDER TUMOR N/A 03/29/2014   Procedure: CYSTOSCOPY, CLOT EVACUATION, RIGHT RETROGRADE, TRANSURETHRAL RESECTION OF BLADDER TUMOR (TURBT);  Surgeon: Ardis Hughs, MD;  Location: [facility];  Service: Urology;  Laterality: N/A;  . TRANSURETHRAL RESECTION OF PROSTATE N/A 06/24/2012   Procedure: TRANSURETHRAL RESECTION OF THE PROSTATE (TURP);  Surgeon: Dutch Gray, MD;  Location: [facility];  Service: Urology;  Laterality: N/A;  CYSTOSCOPY, TRANSURETHRAL RESECTION OF PROSTATIC URETHRAL TUMOR   . URINARY SURGERY  03/1998   bleeding    SOCIAL HISTORY: Social History   Social History  . Marital status: Widowed    Spouse name: N/A  . Number of children: N/A  . Years of education: N/A   Occupational History  . Not on file.   Social History Main Topics  . Smoking status: Former Smoker    Packs/day: 1.50    Years: 20.00    Types: Cigarettes    Quit date: 06/21/1962  . Smokeless tobacco: Never Used  . Alcohol use 2.4 oz/week    4 Glasses of wine per week     Comment: glass wine  occassionally  . Drug use: No  . Sexual activity: No   Other Topics Concern  . Not on file   Social History Narrative  . No narrative on file    FAMILY HISTORY: History reviewed. No pertinent family history.  ALLERGIES:  is allergic to amoxicillin and ciprofloxacin.  MEDICATIONS:  Current Outpatient  Prescriptions  Medication Sig Dispense Refill  . amLODipine (NORVASC) 5 MG tablet Take 1 tablet (5 mg total) by mouth daily. 90 tablet 3  . atenolol (TENORMIN) 25 MG tablet Take 1 tablet (25 mg total) by mouth daily. 90 tablet 3  . cholecalciferol (VITAMIN D) 1000 UNITS tablet Take 1,000 Units by mouth daily.    . Coenzyme Q10 (CO Q-10) 100 MG CAPS Take 1 capsule by mouth daily.     . ferrous sulfate 325 (65 FE) MG tablet Take 325 mg by mouth daily with breakfast.    . finasteride (PROSCAR) 5 MG tablet Take 5 mg by mouth every morning.     Marland Kitchen losartan (COZAAR) 100 MG tablet Take 1 tablet (100 mg total) by mouth daily. 90 tablet 3  . Multiple Vitamin (MULTIVITAMIN WITH MINERALS) TABS Take 1 tablet by mouth daily.    Marland Kitchen  Multiple Vitamins-Minerals (ICAPS AREDS 2 PO) Take 1 capsule by mouth 2 (two) times daily.    . travoprost, benzalkonium, (TRAVATAN) 0.004 % ophthalmic solution Place 1 drop into both eyes at bedtime.     No current facility-administered medications for this visit.     REVIEW OF SYSTEMS:    10 Point review of Systems was done is negative except as noted above.  PHYSICAL EXAMINATION: ECOG PERFORMANCE STATUS: 2 - Symptomatic, <50% confined to bed  . Vitals:   08/02/16 1504  BP: (!) 174/71  Pulse: 70  Resp: 18  Temp: 98.2 F (36.8 C)   Filed Weights   08/02/16 1504  Weight: 147 lb 3.2 oz (66.8 kg)   .Body mass index is 23.76 kg/m.  GENERAL:alert, in no acute distress and comfortable SKIN: no acute rashes, no significant lesions EYES: conjunctiva are pink and non-injected, sclera anicteric OROPHARYNX: MMM, no exudates, no oropharyngeal erythema or ulceration NECK: supple, no JVD LYMPH:  no palpable lymphadenopathy in the cervical, axillary or inguinal regions LUNGS: clear to auscultation b/l with normal respiratory effort HEART Irregular heart rate EN:  normoactive bowel sounds , non tender, not distended.No palpable hepatosplenomegaly  Extremity: no pedal  edema PSYCH: alert & oriented x 3 with fluent speech NEURO: no focal motor/sensory deficits  LABORATORY DATA:  I have reviewed the data as listed  .Marland Kitchen CBC Latest Ref Rng & Units 08/02/2016 06/29/2016 06/29/2016  WBC 4.0 - 10.3 10e3/uL 7.0 8.5 -  Hemoglobin 13.0 - 17.1 g/dL 9.1(L) 9.3(L) -  Hematocrit 38.4 - 49.9 % 26.9(L) 28.1(L) 29.2(L)  Platelets 140 - 400 10e3/uL 247 205 -   . CMP Latest Ref Rng & Units 08/02/2016 06/29/2016 06/29/2016  Glucose 70 - 140 mg/dl 114 87 -  BUN 7.0 - 26.0 mg/dL 18.3 23.2 -  Creatinine 0.7 - 1.3 mg/dL 1.4(H) 1.5(H) -  Sodium 136 - 145 mEq/L 139 141 -  Potassium 3.5 - 5.1 mEq/L 4.5 5.1 -  Chloride 96 - 112 mmol/L - - -  CO2 22 - 29 mEq/L 23 22 -  Calcium 8.4 - 10.4 mg/dL 9.1 9.4 -  Total Protein 6.4 - 8.3 g/dL 7.0 7.7 7.2  Total Bilirubin 0.20 - 1.20 mg/dL 0.82 0.92 -  Alkaline Phos 40 - 150 U/L 69 67 -  AST 5 - 34 U/L 23 30 -  ALT 0 - 55 U/L 15 16 -   Component     Latest Ref Rng & Units 06/29/2016 08/02/2016  IgG (Immunoglobin G), Serum     700 - 1600 mg/dL 1,430   IgA/Immunoglobulin A, Serum     61 - 437 mg/dL 373   IgM, Qn, Serum     15 - 143 mg/dL 91   Total Protein     6.0 - 8.5 g/dL 7.2   Albumin SerPl Elph-Mcnc     2.9 - 4.4 g/dL 3.8   Alpha 1     0.0 - 0.4 g/dL 0.3   Alpha2 Glob SerPl Elph-Mcnc     0.4 - 1.0 g/dL 0.6   B-Globulin SerPl Elph-Mcnc     0.7 - 1.3 g/dL 1.0   Gamma Glob SerPl Elph-Mcnc     0.4 - 1.8 g/dL 1.6   M Protein SerPl Elph-Mcnc     Not Observed g/dL Not Observed   Globulin, Total     2.2 - 3.9 g/dL 3.4   Albumin/Glob SerPl     0.7 - 1.7 1.2   IFE 1  Comment   Please Note (HCV):      Comment   Iron     42 - 163 ug/dL 152   TIBC     202 - 409 ug/dL 283   UIBC     117 - 376 ug/dL 130   %SAT     20 - 55 % 54   Folate, Hemolysate     Not Estab. ng/mL 448.8   HCT     37.5 - 51.0 % 29.2 (L)   Folate, RBC     >498 ng/mL 1,537   Ig Kappa Free Light Chain     3.3 - 19.4 mg/L 48.7 (H)   Ig Lambda  Free Light Chain     5.7 - 26.3 mg/L 49.4 (H)   Kappa/Lambda FluidC Ratio     0.26 - 1.65 0.99   LDH     125 - 245 U/L 180   Haptoglobin     34 - 200 mg/dL 81   Ferritin     22 - 316 ng/ml 217 191  Vitamin B12     232 - 1,245 pg/mL 519   Erythropoietin     2.6 - 18.5 mIU/mL 12.7   Sed Rate     0 - 30 mm/hr 22      RADIOGRAPHIC STUDIES: I have personally reviewed the radiological images as listed and agreed with the findings in the report. No results found.  ASSESSMENT & PLAN:   83 year old gentleman with multiple medical comorbidities with   #1 Chronic normocytic anemia with a hemoglobin in the 9-10 range. This is likely multifactorial Has had heme positive stools which in colonoscopy were thought to be related to radiation proctitis. This could likely cause slow continuous losses. He also has had issues with hematuria which are being monitored by Dr. Alinda Money in the setting of his history of kidney cancer bladder cancer and prostate cancer. At his age one would expect at least some element of MDS. Patient is not keen to do a bone marrow examination. Peripheral blood does reveal some changes suggestive of MDS. Part of his anemia is also likely related to anemia of chronic kidney disease and the fact that he has only a single kidney. Also could have lower testosterone levels .however replacement would not be option considering his medical history  Plan -Possible etiologies of his anemia and extensive lab results  were discussed in details with the patient. -Patient is not keen to pursue bone marrow examination to further define the possibility of MDS. -He shall be continued to follow with urology for appropriate follow-up screening for his RCC, bladder cancer and prostate cancer. -Discussed option of using EPO to address anemia of chronic kidney disease and possible low-grade MDS. He wants to think about this. -Will continue his oral iron replacement at this time to maintain  ferritin levels more than 100. . Orders Placed This Encounter  Procedures  . CBC & Diff and Retic    Standing Status:   Future    Standing Expiration Date:   08/02/2017  . Comprehensive metabolic panel    Standing Status:   Future    Standing Expiration Date:   08/02/2017  . Ferritin    Standing Status:   Future    Standing Expiration Date:   08/02/2017   RTC with Dr Irene Limbo in 2 months with labs  All of the patients questions were answered with apparent satisfaction. The patient knows to call the clinic with any problems, questions or concerns.  I spent 20 minutes counseling the patient face to face. The total time spent in the appointment was 25 minutes and more than 50% was on counseling and direct patient cares.    Sullivan Lone MD [place] [facility] Hematology/Oncology Physician [facility]  (Office):       [phone] (Work cell):  [phone] (Fax):           [phone]

## 2016-09-27 ENCOUNTER — Telehealth: Payer: Self-pay | Admitting: Hematology

## 2016-09-27 ENCOUNTER — Encounter: Payer: Self-pay | Admitting: Hematology

## 2016-09-27 ENCOUNTER — Other Ambulatory Visit (HOSPITAL_BASED_OUTPATIENT_CLINIC_OR_DEPARTMENT_OTHER): Payer: Medicare Other

## 2016-09-27 ENCOUNTER — Ambulatory Visit (HOSPITAL_BASED_OUTPATIENT_CLINIC_OR_DEPARTMENT_OTHER): Payer: Medicare Other | Admitting: Hematology

## 2016-09-27 VITALS — BP 135/47 | HR 63 | Temp 98.6°F | Resp 24 | Ht 66.0 in | Wt 142.5 lb

## 2016-09-27 DIAGNOSIS — N183 Chronic kidney disease, stage 3 unspecified: Secondary | ICD-10-CM

## 2016-09-27 DIAGNOSIS — D4620 Refractory anemia with excess of blasts, unspecified: Secondary | ICD-10-CM

## 2016-09-27 DIAGNOSIS — D649 Anemia, unspecified: Secondary | ICD-10-CM | POA: Diagnosis not present

## 2016-09-27 DIAGNOSIS — N189 Chronic kidney disease, unspecified: Secondary | ICD-10-CM | POA: Insufficient documentation

## 2016-09-27 DIAGNOSIS — D46Z Other myelodysplastic syndromes: Secondary | ICD-10-CM | POA: Insufficient documentation

## 2016-09-27 LAB — COMPREHENSIVE METABOLIC PANEL
ALK PHOS: 74 U/L (ref 40–150)
ALT: 13 U/L (ref 0–55)
AST: 23 U/L (ref 5–34)
Albumin: 3.6 g/dL (ref 3.5–5.0)
Anion Gap: 7 mEq/L (ref 3–11)
BUN: 24.4 mg/dL (ref 7.0–26.0)
CALCIUM: 9.2 mg/dL (ref 8.4–10.4)
CO2: 20 mEq/L — ABNORMAL LOW (ref 22–29)
Chloride: 110 mEq/L — ABNORMAL HIGH (ref 98–109)
Creatinine: 1.6 mg/dL — ABNORMAL HIGH (ref 0.7–1.3)
EGFR: 37 mL/min/{1.73_m2} — ABNORMAL LOW (ref >90)
GLUCOSE: 83 mg/dL (ref 70–140)
Sodium: 137 mEq/L (ref 136–145)
Total Bilirubin: 0.68 mg/dL (ref 0.20–1.20)
Total Protein: 7.2 g/dL (ref 6.4–8.3)

## 2016-09-27 LAB — CBC & DIFF AND RETIC
BASO%: 3.4 % — ABNORMAL HIGH (ref 0.0–2.0)
BASOS ABS: 0.2 10*3/uL — AB (ref 0.0–0.1)
EOS ABS: 0.1 10*3/uL (ref 0.0–0.5)
EOS%: 1.4 % (ref 0.0–7.0)
HEMATOCRIT: 26.2 % — AB (ref 38.4–49.9)
HEMOGLOBIN: 8.7 g/dL — AB (ref 13.0–17.1)
Immature Retic Fract: 6.9 % (ref 3.00–10.60)
LYMPH#: 1 10*3/uL (ref 0.9–3.3)
LYMPH%: 16.8 % (ref 14.0–49.0)
MCH: 30.3 pg (ref 27.2–33.4)
MCHC: 33.2 g/dL (ref 32.0–36.0)
MCV: 91.3 fL (ref 79.3–98.0)
MONO#: 0.7 10*3/uL (ref 0.1–0.9)
MONO%: 12.1 % (ref 0.0–14.0)
NEUT%: 66.3 % (ref 39.0–75.0)
NEUTROS ABS: 3.9 10*3/uL (ref 1.5–6.5)
NRBC: 0 % (ref 0–0)
PLATELETS: 295 10*3/uL (ref 140–400)
RBC: 2.87 10*6/uL — ABNORMAL LOW (ref 4.20–5.82)
RDW: 19.9 % — AB (ref 11.0–14.6)
RETIC CT ABS: 43.62 10*3/uL (ref 34.80–93.90)
Retic %: 1.52 % (ref 0.80–1.80)
WBC: 5.9 10*3/uL (ref 4.0–10.3)

## 2016-09-27 LAB — FERRITIN: Ferritin: 183 ng/ml (ref 22–316)

## 2016-09-27 NOTE — Telephone Encounter (Signed)
Gave avs and calendar for September  - october °

## 2016-09-27 NOTE — Progress Notes (Signed)
Marland Kitchen    HEMATOLOGY/ONCOLOGY CLINIC NOTE  Date of Service: .08/02/2016  Patient Care Team: Alexander Grout, MD (Inactive) as PCP - General (Family Medicine)  CHIEF COMPLAINTS/PURPOSE OF CONSULTATION:  Anemia  HISTORY OF PRESENTING ILLNESS:   Alexander Barnett is a wonderful 83 y.o. male who has been referred to [place] by Alexander .Alexander Grout, MD (Inactive) for evaluation and management of Anemia.  Patient has multiple medical comorbidities as noted below. Has a history of aortic stenosis, atrial fibrillation not on anticoagulation, diabetes, kidney cancer, bladder cancer, prostate cancer (follows with urologist Alexander. Alinda Barnett) , Pulmonary hypertension, chronic kidney disease.  Patient notes that he had no hematuria for 2 years but has noted some hematuria recently. Previously had more issues with radiation proctitis with some rectal bleeding. Has been on ferrous sulfate 325 mg by mouth daily for the last 4-5 months. CT abdomen pelvis on 03/10/2016 showed no acute findings within the abdomen or pelvis and showed status post left nephrectomy status. Had Colonoscopy done on 05/17/2016 by Alexander. Daisey Barnett showed external/internal hemorrhoids, altered vascular mucosa in the rectum consistent with radiation proctitis which was thought to be the cause of the heme-positive stools. Diverticulosis. There was a plan to consider a capsule endoscopy is needed.   review labs recently show that his hemoglobin has been in the 9-10 range for the last year or more and occasionally up to 11. Patient notes no recent PRBC transfusions. His last iron levels on 04/28/2016 showed a ferritin of 118.5. We discussed possibilities of his anemia be multifactorial. He notes that he would not want a very aggressive workup and would not want to pursue bone marrow evaluation unless absolutely necessary. Patient notes that he has scheduled follow-up with Alexander. Alinda Barnett for a repeat screening cystoscopy in the near future.  INTERVAL HISTORY  Patient  is here for follow-up of his multifactorial anemia. He reports that he is still suffering from low energy levels and diarrhea. He believes that his diarrhea has been related to the three courses of antibiotics he has been placed on recently by Alexander Alexander Barnett. He has repeat urine culture next week. Overall, his diarrhea has been intermittent and he has had two stool samples with Alexander Barnett over concern for C. Diff. The first sample which was taken on Friday was normal, his second result is still pending. He has been placed on anti-diarrheal medications for this and he states that this has been forming his stools quite well. Otherwise he has been experiencing less formed stools a couple of times a day. He has also noticed 4-5lbs weight loss with this as well. He has been placed on a sample of probiotics which has improved this somewhat as well. He is still taking his iron supplements as prescribed. His stools have been darker and black in appearance, but he has not noticed any discrete blood within his stool. His urinary symptoms have mostly improved, however, he notes that he still is experiencing intermittent episodes of urinary incontinence. This is thought to be related to radiation-related changes. He denies abdominal pain, or any other associated symptoms.   MEDICAL HISTORY:  Past Medical History:  Diagnosis Date  . Anemia   . Aortic stenosis    moderate by echo 12/2015  . Arthritis    Fingers  . BPH (benign prostatic hyperplasia)   . Bradycardia   . CKD (chronic kidney disease) stage 3, GFR 30-59 ml/min   . Diabetes mellitus without complication ([place])    prediabetes diet controlled  . Diastolic dysfunction   .  Diverticulosis of colon   . Erectile dysfunction   . Hematuria   . History of kidney cancer   . History of skin cancer   . Hx of transfusion   . Hypertension   . Persistent atrial fibrillation ([place]) 06/2006   refused anticoagulation  . Prostate cancer ([place])   . Pulmonary HTN ([place])  12/23/2015   Moderate with PASP 89mmHg  . Transitional cell carcinoma of bladder ([facility])    and renal pelvis    SURGICAL HISTORY: Past Surgical History:  Procedure Laterality Date  . CYSTOSCOPY N/A 06/24/2012   Procedure: CYSTOSCOPY;  Surgeon: Dutch Gray, MD;  Location: [facility];  Service: Urology;  Laterality: N/A;  . CYSTOSCOPY N/A 08/05/2012   Procedure: CYSTOSCOPY;  Surgeon: Dutch Gray, MD;  Location: [facility];  Service: Urology;  Laterality: N/A;  CYSTO, CLOT EVACUATION, FULGERATION OF BLEEDING AREAS AT BLADDER NECK AND PROSTATIC URETHRA    . EYE SURGERY  02/2004   bil.cataract w/ lens implant  . HERNIA REPAIR  05/2002   right  . hyperbaric treatments     40 treatments for prostate-to try to stop bleeding  . imrt radiation  04/2006   for prostate cancer  . MOHS SURGERY  02/2002   nose  . NEPHRECTOMY  03/2007   left  . PROSTATE SURGERY  12/2003  . PROSTATE SURGERY  03/1998   surgery in prostate area  . targus  04/1998   to prostate  . TONSILLECTOMY     as child  . TRANSURETHRAL RESECTION OF BLADDER TUMOR N/A 03/29/2014   Procedure: CYSTOSCOPY, CLOT EVACUATION, RIGHT RETROGRADE, TRANSURETHRAL RESECTION OF BLADDER TUMOR (TURBT);  Surgeon: Ardis Hughs, MD;  Location: [facility];  Service: Urology;  Laterality: N/A;  . TRANSURETHRAL RESECTION OF PROSTATE N/A 06/24/2012   Procedure: TRANSURETHRAL RESECTION OF THE PROSTATE (TURP);  Surgeon: Dutch Gray, MD;  Location: [facility];  Service: Urology;  Laterality: N/A;  CYSTOSCOPY, TRANSURETHRAL RESECTION OF PROSTATIC URETHRAL TUMOR   . URINARY SURGERY  03/1998   bleeding    SOCIAL HISTORY: Social History   Social History  . Marital status: Widowed    Spouse name: N/A  . Number of children: N/A  . Years of education: N/A   Occupational History  . Not on file.   Social History Main Topics  . Smoking status: Former Smoker    Packs/day: 1.50    Years: 20.00    Types: Cigarettes    Quit date: 06/21/1962  . Smokeless tobacco: Never  Used  . Alcohol use 2.4 oz/week    4 Glasses of wine per week     Comment: glass wine  occassionally  . Drug use: No  . Sexual activity: No   Other Topics Concern  . Not on file   Social History Narrative  . No narrative on file    FAMILY HISTORY: No family history on file.  ALLERGIES:  is allergic to amoxicillin and ciprofloxacin.  MEDICATIONS:  Current Outpatient Prescriptions  Medication Sig Dispense Refill  . amLODipine (NORVASC) 5 MG tablet Take 1 tablet (5 mg total) by mouth daily. 90 tablet 3  . atenolol (TENORMIN) 25 MG tablet Take 1 tablet (25 mg total) by mouth daily. 90 tablet 3  . cholecalciferol (VITAMIN D) 1000 UNITS tablet Take 1,000 Units by mouth daily.    . Coenzyme Q10 (CO Q-10) 100 MG CAPS Take 1 capsule by mouth daily.     . ferrous sulfate 325 (65 FE) MG tablet Take 325 mg by  mouth daily with breakfast.    . finasteride (PROSCAR) 5 MG tablet Take 5 mg by mouth every morning.     Marland Kitchen losartan (COZAAR) 100 MG tablet Take 1 tablet (100 mg total) by mouth daily. 90 tablet 3  . Multiple Vitamin (MULTIVITAMIN WITH MINERALS) TABS Take 1 tablet by mouth daily.    . Multiple Vitamins-Minerals (ICAPS AREDS 2 PO) Take 1 capsule by mouth 2 (two) times daily.    . travoprost, benzalkonium, (TRAVATAN) 0.004 % ophthalmic solution Place 1 drop into both eyes at bedtime.     No current facility-administered medications for this visit.     REVIEW OF SYSTEMS:    10 Point review of Systems was done is negative except as noted above.  PHYSICAL EXAMINATION: ECOG PERFORMANCE STATUS: 2 - Symptomatic, <50% confined to bed  . Vitals:   09/27/16 1036  BP: (!) 135/47  Pulse: 63  Resp: (!) 24  Temp: 98.6 F (37 C)  SpO2: 99%   Filed Weights   09/27/16 1036  Weight: 142 lb 8 oz (64.6 kg)   .Body mass index is 23 kg/m.  GENERAL:alert, in no acute distress and comfortable SKIN: no acute rashes, no significant lesions EYES: conjunctiva are pink and non-injected,  sclera anicteric OROPHARYNX: MMM, no exudates, no oropharyngeal erythema or ulceration NECK: supple, no JVD LYMPH:  no palpable lymphadenopathy in the cervical, axillary or inguinal regions LUNGS: clear to auscultation b/l with normal respiratory effort HEART Irregular heart rate EN:  normoactive bowel sounds , non tender, not distended.No palpable hepatosplenomegaly  Extremity: no pedal edema PSYCH: alert & oriented x 3 with fluent speech NEURO: no focal motor/sensory deficits  LABORATORY DATA:  I have reviewed the data as listed  .Marland Kitchen CBC Latest Ref Rng & Units 09/27/2016 08/02/2016 06/29/2016  WBC 4.0 - 10.3 10e3/uL 5.9 7.0 8.5  Hemoglobin 13.0 - 17.1 g/dL 8.7(L) 9.1(L) 9.3(L)  Hematocrit 38.4 - 49.9 % 26.2(L) 26.9(L) 28.1(L)  Platelets 140 - 400 10e3/uL 295 247 205   . CMP Latest Ref Rng & Units 09/27/2016 08/02/2016 06/29/2016  Glucose 70 - 140 mg/dl 83 114 87  BUN 7.0 - 26.0 mg/dL 24.4 18.3 23.2  Creatinine 0.7 - 1.3 mg/dL 1.6(H) 1.4(H) 1.5(H)  Sodium 136 - 145 mEq/L 137 139 141  Potassium 3.5 - 5.1 mEq/L 5.2 No visable hemolysis(H) 4.5 5.1  Chloride 96 - 112 mmol/L - - -  CO2 22 - 29 mEq/L 20(L) 23 22  Calcium 8.4 - 10.4 mg/dL 9.2 9.1 9.4  Total Protein 6.4 - 8.3 g/dL 7.2 7.0 7.7  Total Bilirubin 0.20 - 1.20 mg/dL 0.68 0.82 0.92  Alkaline Phos 40 - 150 U/L 74 69 67  AST 5 - 34 U/L 23 23 30   ALT 0 - 55 U/L 13 15 16    Component     Latest Ref Rng & Units 06/29/2016 08/02/2016  IgG (Immunoglobin G), Serum     700 - 1600 mg/dL 1,430   IgA/Immunoglobulin A, Serum     61 - 437 mg/dL 373   IgM, Qn, Serum     15 - 143 mg/dL 91   Total Protein     6.0 - 8.5 g/dL 7.2   Albumin SerPl Elph-Mcnc     2.9 - 4.4 g/dL 3.8   Alpha 1     0.0 - 0.4 g/dL 0.3   Alpha2 Glob SerPl Elph-Mcnc     0.4 - 1.0 g/dL 0.6   B-Globulin SerPl Elph-Mcnc     0.7 -  1.3 g/dL 1.0   Gamma Glob SerPl Elph-Mcnc     0.4 - 1.8 g/dL 1.6   M Protein SerPl Elph-Mcnc     Not Observed g/dL Not Observed     Globulin, Total     2.2 - 3.9 g/dL 3.4   Albumin/Glob SerPl     0.7 - 1.7 1.2   IFE 1      Comment   Please Note (HCV):      Comment   Iron     42 - 163 ug/dL 152   TIBC     202 - 409 ug/dL 283   UIBC     117 - 376 ug/dL 130   %SAT     20 - 55 % 54   Folate, Hemolysate     Not Estab. ng/mL 448.8   HCT     37.5 - 51.0 % 29.2 (L)   Folate, RBC     >498 ng/mL 1,537   Ig Kappa Free Light Chain     3.3 - 19.4 mg/L 48.7 (H)   Ig Lambda Free Light Chain     5.7 - 26.3 mg/L 49.4 (H)   Kappa/Lambda FluidC Ratio     0.26 - 1.65 0.99   LDH     125 - 245 U/L 180   Haptoglobin     34 - 200 mg/dL 81   Ferritin     22 - 316 ng/ml 217 191  Vitamin B12     232 - 1,245 pg/mL 519   Erythropoietin     2.6 - 18.5 mIU/mL 12.7   Sed Rate     0 - 30 mm/hr 22    . Lab Results  Component Value Date   IRON 152 06/29/2016   TIBC 283 06/29/2016   IRONPCTSAT 54 06/29/2016   (Iron and TIBC)  Lab Results  Component Value Date   FERRITIN 183 09/27/2016     RADIOGRAPHIC STUDIES: I have personally reviewed the radiological images as listed and agreed with the findings in the report. No results found.  ASSESSMENT & PLAN:   83 year old gentleman with multiple medical comorbidities with   #1 Chronic normocytic anemia with a hemoglobin in the 9-10 range. This is likely multifactorial Has had heme positive stools which in colonoscopy were thought to be related to radiation proctitis. This could likely cause slow continuous losses. He also has had issues with hematuria which are being monitored by Alexander. Alinda Barnett in the setting of his history of kidney cancer bladder cancer and prostate cancer. At his age one would expect at least some element of MDS. Patient is not keen to do a bone marrow examination. Peripheral blood does reveal some changes suggestive of MDS. Part of his anemia is also likely related to anemia of chronic kidney disease and the fact that he has only a single kidney. Also  could have lower testosterone levels however replacement would not be option considering his medical history  -Hgb is slightly worse at 8.7 today, 09/27/16. Cr and hormone levels are also elevated today. Ferratin 183. We again discussed that this is likely related to MDS; he still would like to undergo EPO injections before we consider bone marrow examination.  Plan -Possible etiologies of his anemia and extensive lab results were previously discussed in detail with the patient. -Patient is not keen to pursue bone marrow examination to further define the possibility of MDS.  -He would like to pursue EPO injection for now. Iron stores are adequate with  ferritin levels >100. Started on Aranesp 123mcg q2weeks and adjust dose per response. -He shall be continued to follow with urology for appropriate follow-up screening for his RCC, bladder cancer and prostate cancer.  -Please schedule for Aranesp injection q2weeks starting 10/03/2016 -Labs every 2weeks starting from 2nd dose of Aranesp -RTC with Alexander Irene Limbo in 4 weeks with labs   All of the patients questions were answered with apparent satisfaction. The patient knows to call the clinic with any problems, questions or concerns.  I spent 20 minutes counseling the patient face to face. The total time spent in the appointment was 25 minutes and more than 50% was on counseling and direct patient cares.  This document serves as a record of services personally performed by Sullivan Lone, MD. It was created on his behalf by Reola Mosher, a trained medical scribe. The creation of this record is based on the scribe's personal observations and the provider's statements to them. This document has been checked and approved by the attending provider.    Sullivan Lone MD [place] AAHIVMS [facility] Hematology/Oncology Physician [facility]  (Office):       [phone] (Work cell):  [phone] (Fax):           [phone]  This document serves as a  record of services personally performed by Sullivan Lone, MD. It was created on his behalf by Reola Mosher, a trained medical scribe. The creation of this record is based on the scribe's personal observations and the provider's statements to them. This document has been checked and approved by the attending provider.

## 2016-09-27 NOTE — Telephone Encounter (Signed)
Gave avs and calendar ° °

## 2016-10-03 ENCOUNTER — Ambulatory Visit (HOSPITAL_BASED_OUTPATIENT_CLINIC_OR_DEPARTMENT_OTHER): Payer: Medicare Other

## 2016-10-03 VITALS — BP 135/66 | HR 61 | Temp 98.6°F | Resp 22

## 2016-10-03 DIAGNOSIS — D4620 Refractory anemia with excess of blasts, unspecified: Secondary | ICD-10-CM | POA: Diagnosis not present

## 2016-10-03 DIAGNOSIS — N183 Chronic kidney disease, stage 3 unspecified: Secondary | ICD-10-CM

## 2016-10-03 DIAGNOSIS — D649 Anemia, unspecified: Secondary | ICD-10-CM

## 2016-10-03 MED ORDER — DARBEPOETIN ALFA 100 MCG/0.5ML IJ SOSY
100.0000 ug | PREFILLED_SYRINGE | Freq: Once | INTRAMUSCULAR | Status: AC
  Administered 2016-10-03: 100 ug via SUBCUTANEOUS
  Filled 2016-10-03: qty 0.5

## 2016-10-17 ENCOUNTER — Ambulatory Visit (HOSPITAL_BASED_OUTPATIENT_CLINIC_OR_DEPARTMENT_OTHER): Payer: Medicare Other

## 2016-10-17 ENCOUNTER — Other Ambulatory Visit (HOSPITAL_BASED_OUTPATIENT_CLINIC_OR_DEPARTMENT_OTHER): Payer: Medicare Other

## 2016-10-17 VITALS — BP 141/58 | HR 59 | Temp 98.2°F | Resp 18

## 2016-10-17 DIAGNOSIS — D4620 Refractory anemia with excess of blasts, unspecified: Secondary | ICD-10-CM

## 2016-10-17 DIAGNOSIS — D649 Anemia, unspecified: Secondary | ICD-10-CM

## 2016-10-17 DIAGNOSIS — N183 Chronic kidney disease, stage 3 unspecified: Secondary | ICD-10-CM

## 2016-10-17 LAB — CBC & DIFF AND RETIC
BASO%: 3.7 % — ABNORMAL HIGH (ref 0.0–2.0)
BASOS ABS: 0.2 10*3/uL — AB (ref 0.0–0.1)
EOS ABS: 0.1 10*3/uL (ref 0.0–0.5)
EOS%: 1.5 % (ref 0.0–7.0)
HCT: 28.4 % — ABNORMAL LOW (ref 38.4–49.9)
HEMOGLOBIN: 9.4 g/dL — AB (ref 13.0–17.1)
IMMATURE RETIC FRACT: 7.1 % (ref 3.00–10.60)
LYMPH#: 1.2 10*3/uL (ref 0.9–3.3)
LYMPH%: 22.7 % (ref 14.0–49.0)
MCH: 30.9 pg (ref 27.2–33.4)
MCHC: 33.1 g/dL (ref 32.0–36.0)
MCV: 93.4 fL (ref 79.3–98.0)
MONO#: 0.8 10*3/uL (ref 0.1–0.9)
MONO%: 15.2 % — ABNORMAL HIGH (ref 0.0–14.0)
NEUT#: 3.1 10*3/uL (ref 1.5–6.5)
NEUT%: 56.9 % (ref 39.0–75.0)
NRBC: 0 % (ref 0–0)
Platelets: 168 10*3/uL (ref 140–400)
RBC: 3.04 10*6/uL — ABNORMAL LOW (ref 4.20–5.82)
RDW: 20.4 % — AB (ref 11.0–14.6)
RETIC %: 1.28 % (ref 0.80–1.80)
RETIC CT ABS: 38.91 10*3/uL (ref 34.80–93.90)
WBC: 5.5 10*3/uL (ref 4.0–10.3)

## 2016-10-17 LAB — TECHNOLOGIST REVIEW

## 2016-10-17 MED ORDER — DARBEPOETIN ALFA 100 MCG/0.5ML IJ SOSY
100.0000 ug | PREFILLED_SYRINGE | Freq: Once | INTRAMUSCULAR | Status: AC
  Administered 2016-10-17: 100 ug via SUBCUTANEOUS
  Filled 2016-10-17: qty 0.5

## 2016-10-24 NOTE — Progress Notes (Signed)
Marland Kitchen    HEMATOLOGY/ONCOLOGY CLINIC NOTE  Date of Service: 10/25/16  Patient Care Team: Alexander Grout, MD (Inactive) as PCP - General (Family Medicine)  CHIEF COMPLAINTS/PURPOSE OF CONSULTATION:  Anemia  HISTORY OF PRESENTING ILLNESS:   Alexander Barnett is a wonderful 83 y.o. male who has been referred to [place] by Dr .Alexander Grout, MD (Inactive) for evaluation and management of Anemia.  Patient has multiple medical comorbidities as noted below. Has a history of aortic stenosis, atrial fibrillation not on anticoagulation, diabetes, kidney cancer, bladder cancer, prostate cancer (follows with urologist Dr. Alinda Barnett) , Pulmonary hypertension, chronic kidney disease.  Patient notes that he had no hematuria for 2 years but has noted some hematuria recently. Previously had more issues with radiation proctitis with some rectal bleeding. Has been on ferrous sulfate 325 mg by mouth daily for the last 4-5 months. CT abdomen pelvis on 03/10/2016 showed no acute findings within the abdomen or pelvis and showed status post left nephrectomy status. Had Colonoscopy done on 05/17/2016 by Dr. Daisey Barnett showed external/internal hemorrhoids, altered vascular mucosa in the rectum consistent with radiation proctitis which was thought to be the cause of the heme-positive stools. Diverticulosis. There was a plan to consider a capsule endoscopy is needed.   review labs recently show that his hemoglobin has been in the 9-10 range for the last year or more and occasionally up to 11. Patient notes no recent PRBC transfusions. His last iron levels on 04/28/2016 showed a ferritin of 118.5. We discussed possibilities of his anemia be multifactorial. He notes that he would not want a very aggressive workup and would not want to pursue bone marrow evaluation unless absolutely necessary. Patient notes that he has scheduled follow-up with Dr. Alinda Barnett for a repeat screening cystoscopy in the near future.  INTERVAL HISTORY  Pt presents  to the office today for a followup of his multifactorial anemia. He reports that he is doing well overall. He states that he had a UTI who he saw Dr. Alinda Barnett who treated this with abx. This has now resolved. He reports that he has low energy but it is better than it previously was. He reports that he spends time organizing his home and getting his affairs in order.    On review of systems, pt reports fatigue, and denies bloody stools, blood in the urine, fever, chills and any other symptoms.    MEDICAL HISTORY:  Past Medical History:  Diagnosis Date  . Anemia   . Aortic stenosis    moderate by echo 12/2015  . Arthritis    Fingers  . BPH (benign prostatic hyperplasia)   . Bradycardia   . CKD (chronic kidney disease) stage 3, GFR 30-59 ml/min (HCC)   . Diabetes mellitus without complication ([place])    prediabetes diet controlled  . Diastolic dysfunction   . Diverticulosis of colon   . Erectile dysfunction   . Hematuria   . History of kidney cancer   . History of skin cancer   . Hx of transfusion   . Hypertension   . Persistent atrial fibrillation ([place]) 06/2006   refused anticoagulation  . Prostate cancer ([place])   . Pulmonary HTN ([place]) 12/23/2015   Moderate with PASP 62mmHg  . Transitional cell carcinoma of bladder (HCC)    and renal pelvis    SURGICAL HISTORY: Past Surgical History:  Procedure Laterality Date  . CYSTOSCOPY N/A 06/24/2012   Procedure: CYSTOSCOPY;  Surgeon: Alexander Gray, MD;  Location: [facility];  Service: Urology;  Laterality: N/A;  .  CYSTOSCOPY N/A 08/05/2012   Procedure: CYSTOSCOPY;  Surgeon: Alexander Gray, MD;  Location: [facility];  Service: Urology;  Laterality: N/A;  CYSTO, CLOT EVACUATION, FULGERATION OF BLEEDING AREAS AT BLADDER NECK AND PROSTATIC URETHRA    . EYE SURGERY  02/2004   bil.cataract w/ lens implant  . HERNIA REPAIR  05/2002   right  . hyperbaric treatments     40 treatments for prostate-to try to stop bleeding  . imrt radiation  04/2006   for  prostate cancer  . MOHS SURGERY  02/2002   nose  . NEPHRECTOMY  03/2007   left  . PROSTATE SURGERY  12/2003  . PROSTATE SURGERY  03/1998   surgery in prostate area  . targus  04/1998   to prostate  . TONSILLECTOMY     as child  . TRANSURETHRAL RESECTION OF BLADDER TUMOR N/A 03/29/2014   Procedure: CYSTOSCOPY, CLOT EVACUATION, RIGHT RETROGRADE, TRANSURETHRAL RESECTION OF BLADDER TUMOR (TURBT);  Surgeon: Alexander Hughs, MD;  Location: [facility];  Service: Urology;  Laterality: N/A;  . TRANSURETHRAL RESECTION OF PROSTATE N/A 06/24/2012   Procedure: TRANSURETHRAL RESECTION OF THE PROSTATE (TURP);  Surgeon: Alexander Gray, MD;  Location[facility];  Service: Urology;  Laterality: N/A;  CYSTOSCOPY, TRANSURETHRAL RESECTION OF PROSTATIC URETHRAL TUMOR   . URINARY SURGERY  03/1998   bleeding    SOCIAL HISTORY: Social History   Social History  . Marital status: Widowed    Spouse name: N/A  . Number of children: N/A  . Years of education: N/A   Occupational History  . Not on file.   Social History Main Topics  . Smoking status: Former Smoker    Packs/day: 1.50    Years: 20.00    Types: Cigarettes    Quit date: 06/21/1962  . Smokeless tobacco: Never Used  . Alcohol use 2.4 oz/week    4 Glasses of wine per week     Comment: glass wine  occassionally  . Drug use: No  . Sexual activity: No   Other Topics Concern  . Not on file   Social History Narrative  . No narrative on file    FAMILY HISTORY: History reviewed. No pertinent family history.  ALLERGIES:  is allergic to amoxicillin and ciprofloxacin.  MEDICATIONS:  Current Outpatient Prescriptions  Medication Sig Dispense Refill  . amLODipine (NORVASC) 5 MG tablet Take 1 tablet (5 mg total) by mouth daily. 90 tablet 3  . atenolol (TENORMIN) 25 MG tablet Take 1 tablet (25 mg total) by mouth daily. 90 tablet 3  . cholecalciferol (VITAMIN D) 1000 UNITS tablet Take 1,000 Units by mouth daily.    . Coenzyme Q10 (CO Q-10) 100 MG CAPS  Take 1 capsule by mouth daily.     . finasteride (PROSCAR) 5 MG tablet Take 5 mg by mouth every morning.     Marland Kitchen losartan (COZAAR) 100 MG tablet Take 1 tablet (100 mg total) by mouth daily. 90 tablet 3  . Multiple Vitamin (MULTIVITAMIN WITH MINERALS) TABS Take 1 tablet by mouth daily.    . Multiple Vitamins-Minerals (ICAPS AREDS 2 PO) Take 1 capsule by mouth 2 (two) times daily.    . travoprost, benzalkonium, (TRAVATAN) 0.004 % ophthalmic solution Place 1 drop into both eyes at bedtime.    . ferrous sulfate 325 (65 FE) MG tablet Take 325 mg by mouth daily with breakfast.     No current facility-administered medications for this visit.     REVIEW OF SYSTEMS:    10 Point  review of Systems was done is negative except as noted above.  PHYSICAL EXAMINATION: ECOG PERFORMANCE STATUS: 2 - Symptomatic, <50% confined to bed  . Vitals:   10/25/16 1200  BP: (!) 129/53  Pulse: 62  Resp: 17  Temp: 98.5 F (36.9 C)  SpO2: 98%   Filed Weights   10/25/16 1200  Weight: 142 lb 11.2 oz (64.7 kg)   .Body mass index is 23.03 kg/m.  GENERAL:alert, in no acute distress and comfortable SKIN: no acute rashes, no significant lesions EYES: conjunctiva are pink and non-injected, sclera anicteric OROPHARYNX: MMM, no exudates, no oropharyngeal erythema or ulceration NECK: supple, no JVD LYMPH:  no palpable lymphadenopathy in the cervical, axillary or inguinal regions LUNGS: clear to auscultation b/l with normal respiratory effort HEART Irregular heart rate EN:  normoactive bowel sounds , non tender, not distended.No palpable hepatosplenomegaly  Extremity: no pedal edema PSYCH: alert & oriented x 3 with fluent speech NEURO: no focal motor/sensory deficits  LABORATORY DATA:  I have reviewed the data as listed  .Marland Kitchen CBC Latest Ref Rng & Units 10/25/2016 10/17/2016 09/27/2016  WBC 4.0 - 10.3 10e3/uL 5.7 5.5 5.9  Hemoglobin 13.0 - 17.1 g/dL 8.9(L) 9.4(L) 8.7(L)  Hematocrit 38.4 - 49.9 % 26.9(L) 28.4(L)  26.2(L)  Platelets 140 - 400 10e3/uL 254 168 295   . CMP Latest Ref Rng & Units 10/25/2016 09/27/2016 08/02/2016  Glucose 70 - 140 mg/dl 80 83 114  BUN 7.0 - 26.0 mg/dL 19.1 24.4 18.3  Creatinine 0.7 - 1.3 mg/dL 1.5(H) 1.6(H) 1.4(H)  Sodium 136 - 145 mEq/L 139 137 139  Potassium 3.5 - 5.1 mEq/L 4.5 5.2 No visable hemolysis(H) 4.5  Chloride 96 - 112 mmol/L - - -  CO2 22 - 29 mEq/L 21(L) 20(L) 23  Calcium 8.4 - 10.4 mg/dL 8.6 9.2 9.1  Total Protein 6.4 - 8.3 g/dL 6.8 7.2 7.0  Total Bilirubin 0.20 - 1.20 mg/dL 0.81 0.68 0.82  Alkaline Phos 40 - 150 U/L 59 74 69  AST 5 - 34 U/L 31 23 23   ALT 0 - 55 U/L 18 13 15    Component     Latest Ref Rng & Units 06/29/2016 08/02/2016  IgG (Immunoglobin G), Serum     700 - 1600 mg/dL 1,430   IgA/Immunoglobulin A, Serum     61 - 437 mg/dL 373   IgM, Qn, Serum     15 - 143 mg/dL 91   Total Protein     6.0 - 8.5 g/dL 7.2   Albumin SerPl Elph-Mcnc     2.9 - 4.4 g/dL 3.8   Alpha 1     0.0 - 0.4 g/dL 0.3   Alpha2 Glob SerPl Elph-Mcnc     0.4 - 1.0 g/dL 0.6   B-Globulin SerPl Elph-Mcnc     0.7 - 1.3 g/dL 1.0   Gamma Glob SerPl Elph-Mcnc     0.4 - 1.8 g/dL 1.6   M Protein SerPl Elph-Mcnc     Not Observed g/dL Not Observed   Globulin, Total     2.2 - 3.9 g/dL 3.4   Albumin/Glob SerPl     0.7 - 1.7 1.2   IFE 1      Comment   Please Note (HCV):      Comment   Iron     42 - 163 ug/dL 152   TIBC     202 - 409 ug/dL 283   UIBC     117 - 376 ug/dL 130   %SAT  20 - 55 % 54   Folate, Hemolysate     Not Estab. ng/mL 448.8   HCT     37.5 - 51.0 % 29.2 (L)   Folate, RBC     >498 ng/mL 1,537   Ig Kappa Free Light Chain     3.3 - 19.4 mg/L 48.7 (H)   Ig Lambda Free Light Chain     5.7 - 26.3 mg/L 49.4 (H)   Kappa/Lambda FluidC Ratio     0.26 - 1.65 0.99   LDH     125 - 245 U/L 180   Haptoglobin     34 - 200 mg/dL 81   Ferritin     22 - 316 ng/ml 217 191  Vitamin B12     232 - 1,245 pg/mL 519   Erythropoietin     2.6 - 18.5 mIU/mL  12.7   Sed Rate     0 - 30 mm/hr 22    . Lab Results  Component Value Date   IRON 152 06/29/2016   TIBC 283 06/29/2016   IRONPCTSAT 54 06/29/2016   (Iron and TIBC)  Lab Results  Component Value Date   FERRITIN 183 09/27/2016     RADIOGRAPHIC STUDIES: I have personally reviewed the radiological images as listed and agreed with the findings in the report. No results found.  ASSESSMENT & PLAN:   83 year old gentleman with multiple medical comorbidities with   #1 Chronic normocytic anemia with a hemoglobin in the 9-10 range. This is likely multifactorial Has had heme positive stools which in colonoscopy were thought to be related to radiation proctitis. This could likely cause slow continuous losses. He also has had issues with hematuria which are being monitored by Dr. Alinda Barnett in the setting of his history of kidney cancer bladder cancer and prostate cancer. At his age one would expect at least some element of MDS. Patient is not keen to do a bone marrow examination. Peripheral blood does reveal some changes suggestive of MDS. Part of his anemia is also likely related to anemia of chronic kidney disease and the fact that he has only a single kidney. Also could have lower testosterone levels however replacement would not be option considering his medical history   Hgb is 8.9 today, Ferritin 93. We again discussed that this is likely related to MDS; he still would like to undergo EPO injections before we consider bone marrow examination.  Plan - lab results were discussed in details with the patient. --Aranesp dose increased to 28mcg q2weeks and adjust dose per response. -will need IV Iron replacement to maintain ferritin level >100 -He shall be continued to follow with urology for appropriate follow-up screening for his RCC, bladder cancer and prostate cancer.  -increase dose of Aranesp today and continue q2weeks   -RTC with Dr Irene Limbo in 2 months with labs   All of the  patients questions were answered with apparent satisfaction. The patient knows to call the clinic with any problems, questions or concerns.  I spent 20 minutes counseling the patient face to face. The total time spent in the appointment was 25 minutes and more than 50% was on counseling and direct patient cares.    Alexander Lone MD Advance AAHIVMS [facility] Hematology/Oncology Physician [facility]  (Office):       [phone] (Work cell):  [phone] (Fax):           [phone]  This document serves as a record of services personally performed by [facility]  Irene Limbo, MD. It was created on her behalf by Alean Rinne, a trained medical scribe. The creation of this record is based on the scribe's personal observations and the provider's statements to them. This document has been checked and approved by the attending provider.

## 2016-10-25 ENCOUNTER — Other Ambulatory Visit (HOSPITAL_BASED_OUTPATIENT_CLINIC_OR_DEPARTMENT_OTHER): Payer: Medicare Other

## 2016-10-25 ENCOUNTER — Ambulatory Visit (HOSPITAL_BASED_OUTPATIENT_CLINIC_OR_DEPARTMENT_OTHER): Payer: Medicare Other

## 2016-10-25 ENCOUNTER — Ambulatory Visit (HOSPITAL_BASED_OUTPATIENT_CLINIC_OR_DEPARTMENT_OTHER): Payer: Medicare Other | Admitting: Hematology

## 2016-10-25 ENCOUNTER — Encounter: Payer: Self-pay | Admitting: Hematology

## 2016-10-25 ENCOUNTER — Telehealth: Payer: Self-pay | Admitting: Hematology

## 2016-10-25 VITALS — BP 129/53 | HR 62 | Temp 98.5°F | Resp 17 | Ht 66.0 in | Wt 142.7 lb

## 2016-10-25 DIAGNOSIS — D649 Anemia, unspecified: Secondary | ICD-10-CM

## 2016-10-25 DIAGNOSIS — N183 Chronic kidney disease, stage 3 unspecified: Secondary | ICD-10-CM

## 2016-10-25 DIAGNOSIS — I4891 Unspecified atrial fibrillation: Secondary | ICD-10-CM | POA: Diagnosis not present

## 2016-10-25 DIAGNOSIS — D4620 Refractory anemia with excess of blasts, unspecified: Secondary | ICD-10-CM

## 2016-10-25 DIAGNOSIS — Z7901 Long term (current) use of anticoagulants: Secondary | ICD-10-CM | POA: Diagnosis not present

## 2016-10-25 LAB — CBC & DIFF AND RETIC
BASO%: 1.9 % (ref 0.0–2.0)
Basophils Absolute: 0.1 10*3/uL (ref 0.0–0.1)
EOS%: 1.1 % (ref 0.0–7.0)
Eosinophils Absolute: 0.1 10*3/uL (ref 0.0–0.5)
HCT: 26.9 % — ABNORMAL LOW (ref 38.4–49.9)
HGB: 8.9 g/dL — ABNORMAL LOW (ref 13.0–17.1)
Immature Retic Fract: 21.3 % — ABNORMAL HIGH (ref 3.00–10.60)
LYMPH%: 17.4 % (ref 14.0–49.0)
MCH: 30.5 pg (ref 27.2–33.4)
MCHC: 33.1 g/dL (ref 32.0–36.0)
MCV: 92.1 fL (ref 79.3–98.0)
MONO#: 0.9 10*3/uL (ref 0.1–0.9)
MONO%: 15.1 % — AB (ref 0.0–14.0)
NEUT%: 64.5 % (ref 39.0–75.0)
NEUTROS ABS: 3.7 10*3/uL (ref 1.5–6.5)
Platelets: 254 10*3/uL (ref 140–400)
RBC: 2.92 10*6/uL — AB (ref 4.20–5.82)
RDW: 21.2 % — ABNORMAL HIGH (ref 11.0–14.6)
Retic %: 1.96 % — ABNORMAL HIGH (ref 0.80–1.80)
Retic Ct Abs: 57.23 10*3/uL (ref 34.80–93.90)
WBC: 5.7 10*3/uL (ref 4.0–10.3)
lymph#: 1 10*3/uL (ref 0.9–3.3)

## 2016-10-25 LAB — COMPREHENSIVE METABOLIC PANEL
ALK PHOS: 59 U/L (ref 40–150)
ALT: 18 U/L (ref 0–55)
AST: 31 U/L (ref 5–34)
Albumin: 3.6 g/dL (ref 3.5–5.0)
Anion Gap: 10 mEq/L (ref 3–11)
BUN: 19.1 mg/dL (ref 7.0–26.0)
CO2: 21 meq/L — AB (ref 22–29)
Calcium: 8.6 mg/dL (ref 8.4–10.4)
Chloride: 108 mEq/L (ref 98–109)
Creatinine: 1.5 mg/dL — ABNORMAL HIGH (ref 0.7–1.3)
EGFR: 39 mL/min/{1.73_m2} — AB (ref >60)
GLUCOSE: 80 mg/dL (ref 70–140)
POTASSIUM: 4.5 meq/L (ref 3.5–5.1)
SODIUM: 139 meq/L (ref 136–145)
Total Bilirubin: 0.81 mg/dL (ref 0.20–1.20)
Total Protein: 6.8 g/dL (ref 6.4–8.3)

## 2016-10-25 LAB — TECHNOLOGIST REVIEW

## 2016-10-25 MED ORDER — DARBEPOETIN ALFA 200 MCG/0.4ML IJ SOSY
200.0000 ug | PREFILLED_SYRINGE | Freq: Once | INTRAMUSCULAR | Status: AC
  Administered 2016-10-25: 200 ug via SUBCUTANEOUS
  Filled 2016-10-25: qty 0.4

## 2016-10-25 NOTE — Patient Instructions (Signed)

## 2016-10-25 NOTE — Telephone Encounter (Signed)
Scheduled appt per 10/10 los - Gave patient AVS and calender per los.  

## 2016-10-26 LAB — IRON AND TIBC
%SAT: 11 % — AB (ref 20–55)
Iron: 31 ug/dL — ABNORMAL LOW (ref 42–163)
TIBC: 266 ug/dL (ref 202–409)
UIBC: 236 ug/dL (ref 117–376)

## 2016-10-26 LAB — FERRITIN: FERRITIN: 93 ng/mL (ref 22–316)

## 2016-11-02 ENCOUNTER — Telehealth: Payer: Self-pay | Admitting: *Deleted

## 2016-11-02 ENCOUNTER — Telehealth: Payer: Self-pay | Admitting: Hematology

## 2016-11-02 NOTE — Telephone Encounter (Signed)
Spoke with Andria Frames at the [facility] and got the patient scheduled for IV iron due to our treatment area being at capacity. Spoke with patient regarding the appointments.

## 2016-11-02 NOTE — Telephone Encounter (Signed)
Per Dr. Irene Limbo, called patient to make patient aware of low ferritin levels/need for IV iron infusion. Called pt 10/15 @0900  and LVM.  Tuesday 10/16 attempted to reach patient again, no answer.  Scheduling message sent 10/16 to schedule pt for IV iron prior to 10/24 EPO apt per Dr. Irene Limbo request.  10/18 not currently scheduled, high priority message sent.  Staff message sent to Carolanne Grumbling, RN to f/u.

## 2016-11-06 ENCOUNTER — Ambulatory Visit (HOSPITAL_COMMUNITY)
Admission: RE | Admit: 2016-11-06 | Discharge: 2016-11-06 | Disposition: A | Discharge status: Home / Self Care | Payer: Medicare Other | Source: Ambulatory Visit | Attending: Hematology | Admitting: Hematology

## 2016-11-06 ENCOUNTER — Other Ambulatory Visit: Payer: Self-pay

## 2016-11-06 ENCOUNTER — Other Ambulatory Visit: Payer: Self-pay | Admitting: Hematology

## 2016-11-06 DIAGNOSIS — D509 Iron deficiency anemia, unspecified: Secondary | ICD-10-CM | POA: Insufficient documentation

## 2016-11-06 DIAGNOSIS — N183 Chronic kidney disease, stage 3 unspecified: Secondary | ICD-10-CM

## 2016-11-06 DIAGNOSIS — D649 Anemia, unspecified: Secondary | ICD-10-CM

## 2016-11-06 DIAGNOSIS — D4620 Refractory anemia with excess of blasts, unspecified: Secondary | ICD-10-CM

## 2016-11-06 MED ORDER — SODIUM CHLORIDE 0.9 % IV SOLN: 750.0000 mg | INTRAVENOUS | Status: DC

## 2016-11-06 MED ORDER — SODIUM CHLORIDE 0.9 % IV SOLN
750.0000 mg | Freq: Once | INTRAVENOUS | Status: AC
  Administered 2016-11-06: 750 mg via INTRAVENOUS
  Filled 2016-11-06: qty 15

## 2016-11-06 MED ORDER — SODIUM CHLORIDE 0.9 % IV SOLN
750.0000 mg | Freq: Once | INTRAVENOUS | Status: DC
  Filled 2016-11-06: qty 15

## 2016-11-06 NOTE — Progress Notes (Signed)
Diagnosis: Iron Deficiency Anemia  Provider: Irene Limbo, MD  Procedure: Infusion of Injectefer over 20 minutes.  Vital signs stable before and 30 minutes after infusion.  Ambulatory at time of discharge.  Instructions given, patient verbalized understanding.

## 2016-11-08 ENCOUNTER — Ambulatory Visit (HOSPITAL_BASED_OUTPATIENT_CLINIC_OR_DEPARTMENT_OTHER): Payer: Medicare Other

## 2016-11-08 ENCOUNTER — Other Ambulatory Visit (HOSPITAL_BASED_OUTPATIENT_CLINIC_OR_DEPARTMENT_OTHER): Payer: Medicare Other

## 2016-11-08 VITALS — BP 140/77 | HR 63 | Temp 98.3°F | Resp 18

## 2016-11-08 DIAGNOSIS — N183 Chronic kidney disease, stage 3 unspecified: Secondary | ICD-10-CM

## 2016-11-08 DIAGNOSIS — D4620 Refractory anemia with excess of blasts, unspecified: Secondary | ICD-10-CM

## 2016-11-08 DIAGNOSIS — D649 Anemia, unspecified: Secondary | ICD-10-CM

## 2016-11-08 LAB — CBC & DIFF AND RETIC
BASO%: 3.2 % — ABNORMAL HIGH (ref 0.0–2.0)
BASOS ABS: 0.1 10*3/uL (ref 0.0–0.1)
EOS ABS: 0 10*3/uL (ref 0.0–0.5)
EOS%: 0.7 % (ref 0.0–7.0)
HEMATOCRIT: 30.8 % — AB (ref 38.4–49.9)
HGB: 10.2 g/dL — ABNORMAL LOW (ref 13.0–17.1)
IMMATURE RETIC FRACT: 5.3 % (ref 3.00–10.60)
LYMPH%: 15.3 % (ref 14.0–49.0)
MCH: 30.3 pg (ref 27.2–33.4)
MCHC: 33.1 g/dL (ref 32.0–36.0)
MCV: 91.4 fL (ref 79.3–98.0)
MONO#: 1 10*3/uL — AB (ref 0.1–0.9)
MONO%: 21.6 % — ABNORMAL HIGH (ref 0.0–14.0)
NEUT#: 2.6 10*3/uL (ref 1.5–6.5)
NEUT%: 59.2 % (ref 39.0–75.0)
PLATELETS: 211 10*3/uL (ref 140–400)
RBC: 3.37 10*6/uL — AB (ref 4.20–5.82)
RDW: 21.4 % — ABNORMAL HIGH (ref 11.0–14.6)
RETIC %: 1.12 % (ref 0.80–1.80)
RETIC CT ABS: 37.74 10*3/uL (ref 34.80–93.90)
WBC: 4.4 10*3/uL (ref 4.0–10.3)
lymph#: 0.7 10*3/uL — ABNORMAL LOW (ref 0.9–3.3)
nRBC: 0 % (ref 0–0)

## 2016-11-08 MED ORDER — DARBEPOETIN ALFA 200 MCG/0.4ML IJ SOSY
200.0000 ug | PREFILLED_SYRINGE | Freq: Once | INTRAMUSCULAR | Status: AC
  Administered 2016-11-08: 200 ug via SUBCUTANEOUS
  Filled 2016-11-08: qty 0.4

## 2016-11-18 ENCOUNTER — Emergency Department (HOSPITAL_COMMUNITY)
Admission: EM | Admit: 2016-11-18 | Discharge: 2016-11-18 | Disposition: A | Discharge status: Home / Self Care | Payer: Medicare Other | Attending: Emergency Medicine | Admitting: Emergency Medicine

## 2016-11-18 ENCOUNTER — Encounter (HOSPITAL_COMMUNITY): Payer: Self-pay | Admitting: Emergency Medicine

## 2016-11-18 DIAGNOSIS — N3000 Acute cystitis without hematuria: Secondary | ICD-10-CM

## 2016-11-18 DIAGNOSIS — I5032 Chronic diastolic (congestive) heart failure: Secondary | ICD-10-CM | POA: Diagnosis not present

## 2016-11-18 DIAGNOSIS — N3090 Cystitis, unspecified without hematuria: Secondary | ICD-10-CM | POA: Diagnosis not present

## 2016-11-18 DIAGNOSIS — N183 Chronic kidney disease, stage 3 (moderate): Secondary | ICD-10-CM | POA: Insufficient documentation

## 2016-11-18 DIAGNOSIS — E119 Type 2 diabetes mellitus without complications: Secondary | ICD-10-CM | POA: Diagnosis not present

## 2016-11-18 DIAGNOSIS — R339 Retention of urine, unspecified: Secondary | ICD-10-CM

## 2016-11-18 DIAGNOSIS — I130 Hypertensive heart and chronic kidney disease with heart failure and stage 1 through stage 4 chronic kidney disease, or unspecified chronic kidney disease: Secondary | ICD-10-CM | POA: Diagnosis not present

## 2016-11-18 DIAGNOSIS — Z79899 Other long term (current) drug therapy: Secondary | ICD-10-CM | POA: Diagnosis not present

## 2016-11-18 DIAGNOSIS — I481 Persistent atrial fibrillation: Secondary | ICD-10-CM | POA: Diagnosis not present

## 2016-11-18 DIAGNOSIS — Z8546 Personal history of malignant neoplasm of prostate: Secondary | ICD-10-CM | POA: Insufficient documentation

## 2016-11-18 DIAGNOSIS — Z23 Encounter for immunization: Secondary | ICD-10-CM | POA: Diagnosis not present

## 2016-11-18 LAB — CBC WITH DIFFERENTIAL/PLATELET
BASOS ABS: 0.1 10*3/uL (ref 0.0–0.1)
BASOS PCT: 1 %
EOS ABS: 0 10*3/uL (ref 0.0–0.7)
EOS PCT: 0 %
HEMATOCRIT: 32.4 % — AB (ref 39.0–52.0)
Hemoglobin: 10.7 g/dL — ABNORMAL LOW (ref 13.0–17.0)
Lymphocytes Relative: 6 %
Lymphs Abs: 0.9 10*3/uL (ref 0.7–4.0)
MCH: 30.6 pg (ref 26.0–34.0)
MCHC: 33 g/dL (ref 30.0–36.0)
MCV: 92.6 fL (ref 78.0–100.0)
MONO ABS: 1.9 10*3/uL — AB (ref 0.1–1.0)
MONOS PCT: 12 %
NEUTROS ABS: 12.5 10*3/uL — AB (ref 1.7–7.7)
Neutrophils Relative %: 81 %
PLATELETS: 268 10*3/uL (ref 150–400)
RBC: 3.5 MIL/uL — ABNORMAL LOW (ref 4.22–5.81)
RDW: 22 % — AB (ref 11.5–15.5)
WBC: 15.5 10*3/uL — ABNORMAL HIGH (ref 4.0–10.5)

## 2016-11-18 LAB — URINALYSIS, ROUTINE W REFLEX MICROSCOPIC
BILIRUBIN URINE: NEGATIVE
GLUCOSE, UA: NEGATIVE mg/dL
KETONES UR: NEGATIVE mg/dL
NITRITE: NEGATIVE
PH: 8 (ref 5.0–8.0)
PROTEIN: 100 mg/dL — AB
Specific Gravity, Urine: 1.006 (ref 1.005–1.030)
Squamous Epithelial / LPF: NONE SEEN

## 2016-11-18 LAB — BASIC METABOLIC PANEL
ANION GAP: 11 (ref 5–15)
BUN: 17 mg/dL (ref 6–20)
CALCIUM: 8.1 mg/dL — AB (ref 8.9–10.3)
CO2: 20 mmol/L — AB (ref 22–32)
CREATININE: 1.38 mg/dL — AB (ref 0.61–1.24)
Chloride: 105 mmol/L (ref 101–111)
GFR, EST AFRICAN AMERICAN: 49 mL/min — AB (ref >60)
GFR, EST NON AFRICAN AMERICAN: 42 mL/min — AB (ref >60)
Glucose, Bld: 79 mg/dL (ref 65–99)
Potassium: 3.9 mmol/L (ref 3.5–5.1)
Sodium: 136 mmol/L (ref 135–145)

## 2016-11-18 MED ORDER — TETANUS-DIPHTH-ACELL PERTUSSIS 5-2.5-18.5 LF-MCG/0.5 IM SUSP
0.5000 mL | Freq: Once | INTRAMUSCULAR | Status: AC
  Administered 2016-11-18: 0.5 mL via INTRAMUSCULAR
  Filled 2016-11-18: qty 0.5

## 2016-11-18 MED ORDER — DEXTROSE 5 % IV SOLN
1.0000 g | Freq: Once | INTRAVENOUS | Status: AC
  Administered 2016-11-18: 1 g via INTRAVENOUS
  Filled 2016-11-18: qty 10

## 2016-11-18 MED ORDER — CEPHALEXIN 500 MG PO CAPS: 500.0000 mg | ORAL_CAPSULE | Freq: Three times a day (TID) | ORAL | 0 refills | Status: DC

## 2016-11-18 NOTE — ED Triage Notes (Signed)
Patient is complaining of not being able to urinate for about 4 hours. Patient states he has a long history of urinary problems. Patient has had a urinary tract infection in the past few weeks that he had to take 3 different kinds of medications.

## 2016-11-18 NOTE — ED Provider Notes (Addendum)
[facility] DEPT Provider Note   CSN: [id number] Arrival date & time: 11/18/16  0035     History   Chief Complaint Chief Complaint  Patient presents with  . Urinary Retention    HPI Alexander Barnett is a 83 y.o. male history of CKD, diabetes, prostate cancer status post radiation, previous urinary retention here presenting with urinary retention.  Patient had some dysuria and then was unable to urinate around 7 PM.  Patient states that he had previous episodes of urinary retention before.  Patient states that he had previous urinary tract infection and has been followed with [facility].  He recently had a shot of Rocephin and finished amoxicillin and Cipro before. Foley placed in triage.   The history is provided by the patient.    Past Medical History:  Diagnosis Date  . Anemia   . Aortic stenosis    moderate by echo 12/2015  . Arthritis    Fingers  . BPH (benign prostatic hyperplasia)   . Bradycardia   . CKD (chronic kidney disease) stage 3, GFR 30-59 ml/min (HCC)   . Diabetes mellitus without complication ([place])    prediabetes diet controlled  . Diastolic dysfunction   . Diverticulosis of colon   . Erectile dysfunction   . Hematuria   . History of kidney cancer   . History of skin cancer   . Hx of transfusion   . Hypertension   . Persistent atrial fibrillation ([place]) 06/2006   refused anticoagulation  . Prostate cancer ([place])   . Pulmonary HTN ([place]) 12/23/2015   Moderate with PASP 51mmHg  . Transitional cell carcinoma of bladder ([place])    and renal pelvis    Patient Active Problem List   Diagnosis Date Noted  . MDS (myelodysplastic syndrome), low grade ([place]) 09/27/2016  . CKD (chronic kidney disease) 09/27/2016  . Pulmonary HTN ([place]) 12/23/2015  . Hematuria, gross 03/26/2014  . Bradycardia   . CKD (chronic kidney disease) stage 3, GFR 30-59 ml/min (HCC)   . Aortic stenosis   . Diastolic dysfunction   . Hypertension   .  Arthritis   . History of skin cancer   . Hematuria   . Anemia   . Hx of transfusion   . History of kidney cancer   . Persistent atrial fibrillation ([place]) 06/17/2006    Past Surgical History:  Procedure Laterality Date  . CYSTOSCOPY N/A 06/24/2012   Procedure: CYSTOSCOPY;  Surgeon: Dutch Gray, MD;  [facility];  Service: Urology;  Laterality: N/A;  . CYSTOSCOPY N/A 08/05/2012   Procedure: CYSTOSCOPY;  Surgeon: Dutch Gray, MD;  [facility];  Service: Urology;  Laterality: N/A;  CYSTO, CLOT EVACUATION, FULGERATION OF BLEEDING AREAS AT BLADDER NECK AND PROSTATIC URETHRA    . EYE SURGERY  02/2004   bil.cataract w/ lens implant  . HERNIA REPAIR  05/2002   right  . hyperbaric treatments     40 treatments for prostate-to try to stop bleeding  . imrt radiation  04/2006   for prostate cancer  . MOHS SURGERY  02/2002   nose  . NEPHRECTOMY  03/2007   left  . PROSTATE SURGERY  12/2003  . PROSTATE SURGERY  03/1998   surgery in prostate area  . targus  04/1998   to prostate  . TONSILLECTOMY     as child  . TRANSURETHRAL RESECTION OF BLADDER TUMOR N/A 03/29/2014   Procedure: CYSTOSCOPY, CLOT EVACUATION, RIGHT RETROGRADE, TRANSURETHRAL RESECTION OF BLADDER TUMOR (TURBT);  Surgeon:  Ardis Hughs, MD;  [facility];  Service: Urology;  Laterality: N/A;  . TRANSURETHRAL RESECTION OF PROSTATE N/A 06/24/2012   Procedure: TRANSURETHRAL RESECTION OF THE PROSTATE (TURP);  Surgeon: Dutch Gray, MD;  [facility];  Service: Urology;  Laterality: N/A;  CYSTOSCOPY, TRANSURETHRAL RESECTION OF PROSTATIC URETHRAL TUMOR   . URINARY SURGERY  03/1998   bleeding       Home Medications    Prior to Admission medications   Medication Sig Start Date End Date Taking? Authorizing Provider  amLODipine (NORVASC) 5 MG tablet Take 1 tablet (5 mg total) by mouth daily. 12/23/15  Yes Turner, Eber Hong, MD  atenolol (TENORMIN) 25 MG tablet Take 1 tablet (25 mg total) by mouth daily. 12/23/15  Yes  Turner, Eber Hong, MD  cholecalciferol (VITAMIN D) 1000 UNITS tablet Take 1,000 Units by mouth daily.   Yes [provider]  Coenzyme Q10 (CO Q-10) 100 MG CAPS Take 1 capsule by mouth daily.    Yes [provider]  diphenoxylate-atropine (LOMOTIL) 2.5-0.025 MG tablet Take 1 tablet by mouth daily as needed for diarrhea or loose stools. 09/22/16  Yes [provider]  finasteride (PROSCAR) 5 MG tablet Take 5 mg by mouth every morning.    Yes [provider]  losartan (COZAAR) 100 MG tablet Take 1 tablet (100 mg total) by mouth daily. 12/23/15  Yes Turner, Eber Hong, MD  Multiple Vitamin (MULTIVITAMIN WITH MINERALS) TABS Take 1 tablet by mouth daily.   Yes [provider]  Multiple Vitamins-Minerals (ICAPS AREDS 2) CAPS Take 1 capsule by mouth daily.   Yes [provider]  travoprost, benzalkonium, (TRAVATAN) 0.004 % ophthalmic solution Place 1 drop into both eyes at bedtime.   Yes [provider]    Family History History reviewed. No pertinent family history.  Social History Social History  Substance Use Topics  . Smoking status: Former Smoker    Packs/day: 1.50    Years: 20.00    Types: Cigarettes    Quit date: 06/21/1962  . Smokeless tobacco: Never Used  . Alcohol use 2.4 oz/week    4 Glasses of wine per week     Comment: glass wine  occassionally     Allergies   Amoxicillin and Ciprofloxacin   Review of Systems Review of Systems  Genitourinary: Positive for difficulty urinating.  All other systems reviewed and are negative.    Physical Exam Updated Vital Signs BP 139/78 (BP Location: Left Arm)   Pulse 72   Temp 98.1 F (36.7 C) (Oral)   Resp 18   Ht 5\' 6"  (1.676 m)   Wt 66.7 kg (147 lb)   SpO2 97%   BMI 23.73 kg/m   Physical Exam  Constitutional:  Chronically ill   HENT:  Head: Normocephalic.  Mouth/Throat: Oropharynx is clear and moist.  Eyes: Pupils are equal, round, and reactive to light. Conjunctivae  and EOM are normal.  Neck: Normal range of motion. Neck supple.  Cardiovascular: Normal rate, regular rhythm and normal heart sounds.   Pulmonary/Chest: Effort normal and breath sounds normal. No respiratory distress. He has no wheezes. He has no rales.  Abdominal: Soft. Bowel sounds are normal. He exhibits no distension. There is no tenderness. There is no guarding.  Foley in place with clear urine   Musculoskeletal: Normal range of motion.  Neurological: He is alert.  Skin: Skin is warm.  Psychiatric: He has a normal mood and affect.  Nursing note and vitals reviewed.  ED Treatments / Results  Labs (all labs ordered are listed, but only abnormal results are displayed) Labs Reviewed  URINALYSIS, ROUTINE W REFLEX MICROSCOPIC - Abnormal; Notable for the following:       Result Value   Hgb urine dipstick SMALL (*)    Protein, ur 100 (*)    Leukocytes, UA MODERATE (*)    Bacteria, UA MANY (*)    All other components within normal limits  CBC WITH DIFFERENTIAL/PLATELET - Abnormal; Notable for the following:    WBC 15.5 (*)    RBC 3.50 (*)    Hemoglobin 10.7 (*)    HCT 32.4 (*)    RDW 22.0 (*)    Neutro Abs 12.5 (*)    Monocytes Absolute 1.9 (*)    All other components within normal limits  BASIC METABOLIC PANEL - Abnormal; Notable for the following:    CO2 20 (*)    Creatinine, Ser 1.38 (*)    Calcium 8.1 (*)    GFR calc non Af Amer 42 (*)    GFR calc Af Amer 49 (*)    All other components within normal limits  URINE CULTURE    EKG  EKG Interpretation None       Radiology No results found.  Procedures Procedures (including critical care time)  Medications Ordered in ED Medications  cefTRIAXone (ROCEPHIN) 1 g in dextrose 5 % 50 mL IVPB (1 g Intravenous New Bag/Given 11/18/16 0432)     Initial Impression / Assessment and Plan / ED Course  I have reviewed the triage vital signs and the nursing notes.  Pertinent labs & imaging results that were available  during my care of the patient were reviewed by me and considered in my medical decision making (see chart for details).     Redding Cloe is a 83 y.o. male here with urinary retention. Patient has hx of retention since he had prostate radiation. Foley placed in triage. Will check UA, labs.   6:25 AM WBC 15. UA + UTI. Given rocephin. Urine culture sent. Will dc home with keflex.   6:40 AM Upon discharge, patient told me that he fell in triage and had skin tear R 4th finger and R elbow. No bony tenderness. Dressing applied R 4th finger. Nl ROM R elbow. No need for xrays. Tdap updated.   Final Clinical Impressions(s) / ED Diagnoses   Final diagnoses:  None    New Prescriptions New Prescriptions   No medications on file     Drenda Freeze, MD 11/18/16 [id number]    Drenda Freeze, MD 11/18/16 [id number]    Drenda Freeze, MD 11/18/16 [phone]

## 2016-11-18 NOTE — Discharge Instructions (Signed)
Take keflex three times daily for a week.   See your urologist for foley removal in a week   Return to ER if you are unable to urinate, blood in foley, fever, vomiting

## 2016-11-22 ENCOUNTER — Ambulatory Visit: Payer: Medicare Other

## 2016-11-22 ENCOUNTER — Other Ambulatory Visit (HOSPITAL_BASED_OUTPATIENT_CLINIC_OR_DEPARTMENT_OTHER): Payer: Medicare Other

## 2016-11-22 DIAGNOSIS — D4620 Refractory anemia with excess of blasts, unspecified: Secondary | ICD-10-CM

## 2016-11-22 DIAGNOSIS — D649 Anemia, unspecified: Secondary | ICD-10-CM | POA: Diagnosis not present

## 2016-11-22 DIAGNOSIS — N183 Chronic kidney disease, stage 3 (moderate): Secondary | ICD-10-CM | POA: Diagnosis not present

## 2016-11-22 LAB — CBC & DIFF AND RETIC
BASO%: 2.6 % — AB (ref 0.0–2.0)
BASOS ABS: 0.2 10*3/uL — AB (ref 0.0–0.1)
EOS%: 0.9 % (ref 0.0–7.0)
Eosinophils Absolute: 0.1 10*3/uL (ref 0.0–0.5)
HCT: 34.6 % — ABNORMAL LOW (ref 38.4–49.9)
HGB: 11.3 g/dL — ABNORMAL LOW (ref 13.0–17.1)
LYMPH#: 1.1 10*3/uL (ref 0.9–3.3)
LYMPH%: 17 % (ref 14.0–49.0)
MCH: 30.5 pg (ref 27.2–33.4)
MCHC: 32.7 g/dL (ref 32.0–36.0)
MCV: 93.3 fL (ref 79.3–98.0)
MONO#: 1 10*3/uL — ABNORMAL HIGH (ref 0.1–0.9)
MONO%: 15.3 % — AB (ref 0.0–14.0)
NEUT#: 4.3 10*3/uL (ref 1.5–6.5)
NEUT%: 64.2 % (ref 39.0–75.0)
PLATELETS: 260 10*3/uL (ref 140–400)
RBC: 3.71 10*6/uL — AB (ref 4.20–5.82)
RDW: 22 % — ABNORMAL HIGH (ref 11.0–14.6)
WBC: 6.7 10*3/uL (ref 4.0–10.3)
nRBC: 0 % (ref 0–0)

## 2016-11-22 LAB — URINE CULTURE
Culture: >=100000 — AB
SPECIAL REQUESTS: NORMAL

## 2016-11-22 NOTE — Progress Notes (Signed)
Hemoglobin noted at 11.3 today. No injection needed at this time. Pt given copy of labs and current schedule. Instructed to follow schedule and call our office if issues occur.

## 2016-11-23 ENCOUNTER — Telehealth: Payer: Self-pay | Admitting: *Deleted

## 2016-11-23 LAB — RETICULOCYTES: RETICULOCYTE COUNT: 1.8 % (ref 0.6–2.6)

## 2016-11-23 NOTE — Telephone Encounter (Signed)
Post ED Visit - Positive Culture Follow-up  Culture report reviewed by antimicrobial stewardship pharmacist:  []  Elenor Quinones, Pharm.D. []  Heide Guile, Pharm.D., BCPS AQ-ID []  Parks Neptune, Pharm.D., BCPS []  Alycia Rossetti, Pharm.D., BCPS []  [place], [place].D., BCPS, AAHIVP []  Legrand Como, Pharm.D., BCPS, AAHIVP []  Salome Arnt, PharmD, BCPS []  Dimitri Ped, PharmD, BCPS []  Vincenza Hews, PharmD, BCPS  Positive urine culture reviewed by Charlene Brooke, PharmD Treated with CTX at ED visit and no further patient follow-up is required at this time.  [facility] 11/23/2016, 11:38 AM

## 2016-12-06 ENCOUNTER — Other Ambulatory Visit (HOSPITAL_BASED_OUTPATIENT_CLINIC_OR_DEPARTMENT_OTHER): Payer: Medicare Other

## 2016-12-06 ENCOUNTER — Ambulatory Visit (HOSPITAL_BASED_OUTPATIENT_CLINIC_OR_DEPARTMENT_OTHER): Payer: Medicare Other

## 2016-12-06 ENCOUNTER — Other Ambulatory Visit: Payer: Medicare Other

## 2016-12-06 ENCOUNTER — Ambulatory Visit: Payer: Medicare Other

## 2016-12-06 VITALS — BP 145/74 | HR 56 | Temp 98.1°F | Resp 18

## 2016-12-06 DIAGNOSIS — D4620 Refractory anemia with excess of blasts, unspecified: Secondary | ICD-10-CM | POA: Diagnosis not present

## 2016-12-06 DIAGNOSIS — N183 Chronic kidney disease, stage 3 unspecified: Secondary | ICD-10-CM

## 2016-12-06 DIAGNOSIS — D649 Anemia, unspecified: Secondary | ICD-10-CM

## 2016-12-06 LAB — CBC & DIFF AND RETIC
BASO%: 2.5 % — AB (ref 0.0–2.0)
Basophils Absolute: 0.1 10*3/uL (ref 0.0–0.1)
EOS%: 0.7 % (ref 0.0–7.0)
Eosinophils Absolute: 0 10*3/uL (ref 0.0–0.5)
HCT: 24.9 % — ABNORMAL LOW (ref 38.4–49.9)
HGB: 8 g/dL — ABNORMAL LOW (ref 13.0–17.1)
Immature Retic Fract: 14.5 % — ABNORMAL HIGH (ref 3.00–10.60)
LYMPH%: 19.3 % (ref 14.0–49.0)
MCH: 29.9 pg (ref 27.2–33.4)
MCHC: 32.1 g/dL (ref 32.0–36.0)
MCV: 92.9 fL (ref 79.3–98.0)
MONO#: 0.7 10*3/uL (ref 0.1–0.9)
MONO%: 12.3 % (ref 0.0–14.0)
NEUT#: 3.7 10*3/uL (ref 1.5–6.5)
NEUT%: 65.2 % (ref 39.0–75.0)
PLATELETS: 198 10*3/uL (ref 140–400)
RBC: 2.68 10*6/uL — AB (ref 4.20–5.82)
RDW: 21.3 % — ABNORMAL HIGH (ref 11.0–14.6)
Retic %: 1.38 % (ref 0.80–1.80)
Retic Ct Abs: 36.98 10*3/uL (ref 34.80–93.90)
WBC: 5.7 10*3/uL (ref 4.0–10.3)
lymph#: 1.1 10*3/uL (ref 0.9–3.3)

## 2016-12-06 LAB — TECHNOLOGIST REVIEW

## 2016-12-06 MED ORDER — DARBEPOETIN ALFA 200 MCG/0.4ML IJ SOSY
200.0000 ug | PREFILLED_SYRINGE | Freq: Once | INTRAMUSCULAR | Status: AC
  Administered 2016-12-06: 200 ug via SUBCUTANEOUS
  Filled 2016-12-06: qty 0.4

## 2016-12-06 NOTE — Patient Instructions (Signed)

## 2016-12-07 ENCOUNTER — Other Ambulatory Visit: Payer: Self-pay | Admitting: Cardiology

## 2016-12-18 ENCOUNTER — Other Ambulatory Visit: Payer: Self-pay | Admitting: Dermatology

## 2016-12-20 ENCOUNTER — Other Ambulatory Visit (HOSPITAL_BASED_OUTPATIENT_CLINIC_OR_DEPARTMENT_OTHER): Payer: Medicare Other

## 2016-12-20 ENCOUNTER — Ambulatory Visit (HOSPITAL_BASED_OUTPATIENT_CLINIC_OR_DEPARTMENT_OTHER): Payer: Medicare Other

## 2016-12-20 VITALS — BP 145/62 | HR 61 | Temp 98.0°F | Resp 20

## 2016-12-20 DIAGNOSIS — D649 Anemia, unspecified: Secondary | ICD-10-CM | POA: Diagnosis not present

## 2016-12-20 DIAGNOSIS — D4620 Refractory anemia with excess of blasts, unspecified: Secondary | ICD-10-CM | POA: Diagnosis not present

## 2016-12-20 DIAGNOSIS — N183 Chronic kidney disease, stage 3 unspecified: Secondary | ICD-10-CM

## 2016-12-20 LAB — CBC & DIFF AND RETIC
BASO%: 3.3 % — ABNORMAL HIGH (ref 0.0–2.0)
BASOS ABS: 0.2 10*3/uL — AB (ref 0.0–0.1)
EOS%: 2 % (ref 0.0–7.0)
Eosinophils Absolute: 0.1 10*3/uL (ref 0.0–0.5)
HCT: 27.1 % — ABNORMAL LOW (ref 38.4–49.9)
HGB: 8.6 g/dL — ABNORMAL LOW (ref 13.0–17.1)
IMMATURE RETIC FRACT: 8 % (ref 3.00–10.60)
LYMPH#: 0.9 10*3/uL (ref 0.9–3.3)
LYMPH%: 15.7 % (ref 14.0–49.0)
MCH: 28.8 pg (ref 27.2–33.4)
MCHC: 31.7 g/dL — ABNORMAL LOW (ref 32.0–36.0)
MCV: 90.6 fL (ref 79.3–98.0)
MONO#: 0.9 10*3/uL (ref 0.1–0.9)
MONO%: 16.4 % — AB (ref 0.0–14.0)
NEUT%: 62.6 % (ref 39.0–75.0)
NEUTROS ABS: 3.4 10*3/uL (ref 1.5–6.5)
NRBC: 0 % (ref 0–0)
Platelets: 247 10*3/uL (ref 140–400)
RBC: 2.99 10*6/uL — AB (ref 4.20–5.82)
RDW: 20.2 % — ABNORMAL HIGH (ref 11.0–14.6)
RETIC %: 1.72 % (ref 0.80–1.80)
Retic Ct Abs: 51.43 10*3/uL (ref 34.80–93.90)
WBC: 5.5 10*3/uL (ref 4.0–10.3)

## 2016-12-20 LAB — COMPREHENSIVE METABOLIC PANEL
ALT: 11 U/L (ref 0–55)
AST: 19 U/L (ref 5–34)
Albumin: 3.6 g/dL (ref 3.5–5.0)
Alkaline Phosphatase: 90 U/L (ref 40–150)
Anion Gap: 8 mEq/L (ref 3–11)
BILIRUBIN TOTAL: 0.73 mg/dL (ref 0.20–1.20)
BUN: 22.3 mg/dL (ref 7.0–26.0)
CO2: 22 meq/L (ref 22–29)
Calcium: 8.6 mg/dL (ref 8.4–10.4)
Chloride: 110 mEq/L — ABNORMAL HIGH (ref 98–109)
Creatinine: 1.4 mg/dL — ABNORMAL HIGH (ref 0.7–1.3)
EGFR: 43 mL/min/{1.73_m2} — AB (ref >60)
GLUCOSE: 86 mg/dL (ref 70–140)
POTASSIUM: 4 meq/L (ref 3.5–5.1)
SODIUM: 140 meq/L (ref 136–145)
TOTAL PROTEIN: 7 g/dL (ref 6.4–8.3)

## 2016-12-20 LAB — FERRITIN: FERRITIN: 118 ng/mL (ref 22–316)

## 2016-12-20 LAB — IRON AND TIBC
%SAT: 31 % (ref 20–55)
Iron: 82 ug/dL (ref 42–163)
TIBC: 264 ug/dL (ref 202–409)
UIBC: 182 ug/dL (ref 117–376)

## 2016-12-20 MED ORDER — DARBEPOETIN ALFA 200 MCG/0.4ML IJ SOSY
200.0000 ug | PREFILLED_SYRINGE | Freq: Once | INTRAMUSCULAR | Status: AC
  Administered 2016-12-20: 200 ug via SUBCUTANEOUS
  Filled 2016-12-20: qty 0.4

## 2016-12-20 NOTE — Patient Instructions (Signed)

## 2016-12-25 ENCOUNTER — Other Ambulatory Visit (HOSPITAL_COMMUNITY): Payer: Medicare Other

## 2016-12-29 ENCOUNTER — Ambulatory Visit: Payer: Medicare Other | Admitting: Cardiology

## 2017-01-03 ENCOUNTER — Ambulatory Visit (HOSPITAL_BASED_OUTPATIENT_CLINIC_OR_DEPARTMENT_OTHER): Payer: Medicare Other

## 2017-01-03 ENCOUNTER — Ambulatory Visit (HOSPITAL_BASED_OUTPATIENT_CLINIC_OR_DEPARTMENT_OTHER): Payer: Medicare Other | Admitting: Hematology

## 2017-01-03 ENCOUNTER — Other Ambulatory Visit (HOSPITAL_BASED_OUTPATIENT_CLINIC_OR_DEPARTMENT_OTHER): Payer: Medicare Other

## 2017-01-03 ENCOUNTER — Encounter: Payer: Self-pay | Admitting: Hematology

## 2017-01-03 VITALS — BP 158/73 | HR 63

## 2017-01-03 VITALS — BP 172/68 | HR 64 | Temp 97.6°F | Resp 12 | Ht 66.0 in | Wt 145.4 lb

## 2017-01-03 DIAGNOSIS — D509 Iron deficiency anemia, unspecified: Secondary | ICD-10-CM

## 2017-01-03 DIAGNOSIS — N183 Chronic kidney disease, stage 3 unspecified: Secondary | ICD-10-CM

## 2017-01-03 DIAGNOSIS — E119 Type 2 diabetes mellitus without complications: Secondary | ICD-10-CM

## 2017-01-03 DIAGNOSIS — D4620 Refractory anemia with excess of blasts, unspecified: Secondary | ICD-10-CM

## 2017-01-03 DIAGNOSIS — D649 Anemia, unspecified: Secondary | ICD-10-CM | POA: Diagnosis not present

## 2017-01-03 DIAGNOSIS — Z8546 Personal history of malignant neoplasm of prostate: Secondary | ICD-10-CM | POA: Diagnosis not present

## 2017-01-03 DIAGNOSIS — Z8551 Personal history of malignant neoplasm of bladder: Secondary | ICD-10-CM

## 2017-01-03 DIAGNOSIS — Z85528 Personal history of other malignant neoplasm of kidney: Secondary | ICD-10-CM

## 2017-01-03 DIAGNOSIS — I272 Pulmonary hypertension, unspecified: Secondary | ICD-10-CM

## 2017-01-03 LAB — CBC & DIFF AND RETIC
BASO%: 5.2 % — ABNORMAL HIGH (ref 0.0–2.0)
BASOS ABS: 0.2 10*3/uL — AB (ref 0.0–0.1)
EOS ABS: 0.1 10*3/uL (ref 0.0–0.5)
EOS%: 1.2 % (ref 0.0–7.0)
HCT: 28.3 % — ABNORMAL LOW (ref 38.4–49.9)
HEMOGLOBIN: 9.1 g/dL — AB (ref 13.0–17.1)
IMMATURE RETIC FRACT: 7.8 % (ref 3.00–10.60)
LYMPH%: 19.8 % (ref 14.0–49.0)
MCH: 28.4 pg (ref 27.2–33.4)
MCHC: 32.2 g/dL (ref 32.0–36.0)
MCV: 88.4 fL (ref 79.3–98.0)
MONO#: 0.8 10*3/uL (ref 0.1–0.9)
MONO%: 18.8 % — AB (ref 0.0–14.0)
NEUT%: 55 % (ref 39.0–75.0)
NEUTROS ABS: 2.2 10*3/uL (ref 1.5–6.5)
PLATELETS: 265 10*3/uL (ref 140–400)
RBC: 3.2 10*6/uL — ABNORMAL LOW (ref 4.20–5.82)
RDW: 20.3 % — AB (ref 11.0–14.6)
Retic %: 1.24 % (ref 0.80–1.80)
Retic Ct Abs: 39.68 10*3/uL (ref 34.80–93.90)
WBC: 4 10*3/uL (ref 4.0–10.3)
lymph#: 0.8 10*3/uL — ABNORMAL LOW (ref 0.9–3.3)
nRBC: 0 % (ref 0–0)

## 2017-01-03 LAB — TECHNOLOGIST REVIEW

## 2017-01-03 MED ORDER — DARBEPOETIN ALFA 200 MCG/0.4ML IJ SOSY
PREFILLED_SYRINGE | INTRAMUSCULAR | Status: AC
Start: 2017-01-03 — End: ?
  Filled 2017-01-03: qty 0.4

## 2017-01-03 MED ORDER — DARBEPOETIN ALFA 200 MCG/0.4ML IJ SOSY
200.0000 ug | PREFILLED_SYRINGE | Freq: Once | INTRAMUSCULAR | Status: AC
  Administered 2017-01-03: 200 ug via SUBCUTANEOUS

## 2017-01-03 NOTE — Progress Notes (Signed)
Marland Kitchen    HEMATOLOGY/ONCOLOGY CLINIC NOTE  Date of Service: 01/03/17  Patient Care Team: Alexander Grout, MD (Inactive) as PCP - General (Family Medicine)  CHIEF COMPLAINTS/PURPOSE OF CONSULTATION:  Anemia  HISTORY OF PRESENTING ILLNESS:   Alexander Barnett is a wonderful 83 y.o. male who has been referred to [place] by Dr .Alexander Grout, MD (Inactive) for evaluation and management of Anemia.  Patient has multiple medical comorbidities as noted below. Has a history of aortic stenosis, atrial fibrillation not on anticoagulation, diabetes, kidney cancer, bladder cancer, prostate cancer (follows with urologist Dr. Alinda Barnett) , Pulmonary hypertension, chronic kidney disease.  Patient notes that he had no hematuria for 2 years but has noted some hematuria recently. Previously had more issues with radiation proctitis with some rectal bleeding. Has been on ferrous sulfate 325 mg by mouth daily for the last 4-5 months. CT abdomen pelvis on 03/10/2016 showed no acute findings within the abdomen or pelvis and showed status post left nephrectomy status. Had Colonoscopy done on 05/17/2016 by Dr. Daisey Must showed external/internal hemorrhoids, altered vascular mucosa in the rectum consistent with radiation proctitis which was thought to be the cause of the heme-positive stools. Diverticulosis. There was a plan to consider a capsule endoscopy is needed.   review labs recently show that his hemoglobin has been in the 9-10 range for the last year or more and occasionally up to 11. Patient notes no recent PRBC transfusions. His last iron levels on 04/28/2016 showed a ferritin of 118.5. We discussed possibilities of his anemia be multifactorial. He notes that he would not want a very aggressive workup and would not want to pursue bone marrow evaluation unless absolutely necessary. Patient notes that he has scheduled follow-up with Dr. Alinda Barnett for a repeat screening cystoscopy in the near future.  INTERVAL HISTORY  Pt presents  to the office today for a followup of his multifactorial anemia. He reports that he is doing well overall. His current dose of Aranesp has kept his current Hgb at 9.1. He has not required a blood transfusion in the interim. He did have a minor fall several days ago, sustaining a moderate hematoma to the right knee. He is being followed by his PCP for this who performed a XR of the right knee which was without fracture. No other recent injuries. His ferritin was 118 two weeks ago. He is not currently taking PO iron replacement. His energy levels have been improving well in the interim. He has also quit eating red meat and he notes that he has not noticed any bloody stools anymore.  On review of systems, pt reports fatigue, and denies bloody stools, blood in the urine, fever, chills and any other symptoms. He denies diarrhea.    MEDICAL HISTORY:  Past Medical History:  Diagnosis Date  . Anemia   . Aortic stenosis    moderate by echo 12/2015  . Arthritis    Fingers  . BPH (benign prostatic hyperplasia)   . Bradycardia   . CKD (chronic kidney disease) stage 3, GFR 30-59 ml/min (HCC)   . Diabetes mellitus without complication ([place])    prediabetes diet controlled  . Diastolic dysfunction   . Diverticulosis of colon   . Erectile dysfunction   . Hematuria   . History of kidney cancer   . History of skin cancer   . Hx of transfusion   . Hypertension   . Persistent atrial fibrillation ([place]) 06/2006   refused anticoagulation  . Prostate cancer ([place])   . Pulmonary HTN (  [place]) 12/23/2015   Moderate with PASP 46mmHg  . Transitional cell carcinoma of bladder (HCC)    and renal pelvis    SURGICAL HISTORY: Past Surgical History:  Procedure Laterality Date  . CYSTOSCOPY N/A 06/24/2012   Procedure: CYSTOSCOPY;  Surgeon: Dutch Gray, MD;  Location: [facility];  Service: Urology;  Laterality: N/A;  . CYSTOSCOPY N/A 08/05/2012   Procedure: CYSTOSCOPY;  Surgeon: Dutch Gray, MD;  Location: [facility];  Service:  Urology;  Laterality: N/A;  CYSTO, CLOT EVACUATION, FULGERATION OF BLEEDING AREAS AT BLADDER NECK AND PROSTATIC URETHRA    . EYE SURGERY  02/2004   bil.cataract w/ lens implant  . HERNIA REPAIR  05/2002   right  . hyperbaric treatments     40 treatments for prostate-to try to stop bleeding  . imrt radiation  04/2006   for prostate cancer  . MOHS SURGERY  02/2002   nose  . NEPHRECTOMY  03/2007   left  . PROSTATE SURGERY  12/2003  . PROSTATE SURGERY  03/1998   surgery in prostate area  . targus  04/1998   to prostate  . TONSILLECTOMY     as child  . TRANSURETHRAL RESECTION OF BLADDER TUMOR N/A 03/29/2014   Procedure: CYSTOSCOPY, CLOT EVACUATION, RIGHT RETROGRADE, TRANSURETHRAL RESECTION OF BLADDER TUMOR (TURBT);  Surgeon: Ardis Hughs, MD;  Location: [facility];  Service: Urology;  Laterality: N/A;  . TRANSURETHRAL RESECTION OF PROSTATE N/A 06/24/2012   Procedure: TRANSURETHRAL RESECTION OF THE PROSTATE (TURP);  Surgeon: Dutch Gray, MD;  Location: [facility];  Service: Urology;  Laterality: N/A;  CYSTOSCOPY, TRANSURETHRAL RESECTION OF PROSTATIC URETHRAL TUMOR   . URINARY SURGERY  03/1998   bleeding    SOCIAL HISTORY: Social History   Socioeconomic History  . Marital status: Widowed    Spouse name: Not on file  . Number of children: Not on file  . Years of education: Not on file  . Highest education level: Not on file  Social Needs  . Financial resource strain: Not on file  . Food insecurity - worry: Not on file  . Food insecurity - inability: Not on file  . Transportation needs - medical: Not on file  . Transportation needs - non-medical: Not on file  Occupational History  . Not on file  Tobacco Use  . Smoking status: Former Smoker    Packs/day: 1.50    Years: 20.00    Pack years: 30.00    Types: Cigarettes    Last attempt to quit: 06/21/1962    Years since quitting: 54.5  . Smokeless tobacco: Never Used  Substance and Sexual Activity  . Alcohol use: Yes     Alcohol/week: 2.4 oz    Types: 4 Glasses of wine per week    Comment: glass wine  occassionally  . Drug use: No  . Sexual activity: No  Other Topics Concern  . Not on file  Social History Narrative  . Not on file    FAMILY HISTORY: History reviewed. No pertinent family history.  ALLERGIES:  is allergic to amoxicillin and ciprofloxacin.  MEDICATIONS:  Current Outpatient Medications  Medication Sig Dispense Refill  . amLODipine (NORVASC) 5 MG tablet Take 1 tablet (5 mg total) by mouth daily. 90 tablet 3  . atenolol (TENORMIN) 25 MG tablet Take 1 tablet (25 mg total) by mouth daily. 90 tablet 3  . cholecalciferol (VITAMIN D) 1000 UNITS tablet Take 1,000 Units by mouth daily.    . Coenzyme Q10 (CO Q-10) 100 MG CAPS  Take 1 capsule by mouth daily.     . diphenoxylate-atropine (LOMOTIL) 2.5-0.025 MG tablet Take 1 tablet by mouth daily as needed for diarrhea or loose stools.  5  . finasteride (PROSCAR) 5 MG tablet Take 5 mg by mouth every morning.     Marland Kitchen losartan (COZAAR) 100 MG tablet Take 1 tablet (100 mg total) by mouth daily. Please keep upcoming appt with Dr.Turner in December. Thanks 90 tablet 0  . Multiple Vitamin (MULTIVITAMIN WITH MINERALS) TABS Take 1 tablet by mouth daily.    . Multiple Vitamins-Minerals (ICAPS AREDS 2) CAPS Take 1 capsule by mouth daily.    . travoprost, benzalkonium, (TRAVATAN) 0.004 % ophthalmic solution Place 1 drop into both eyes at bedtime.     No current facility-administered medications for this visit.    Facility-Administered Medications Ordered in Other Visits  Medication Dose Route Frequency Provider Last Rate Last Dose  . ferric carboxymaltose (INJECTAFER) 750 mg in sodium chloride 0.9 % 250 mL IVPB  750 mg Intravenous Once Alexander Genera, MD        REVIEW OF SYSTEMS:    10 Point review of Systems was done is negative except as noted above.  PHYSICAL EXAMINATION: ECOG PERFORMANCE STATUS: 2 - Symptomatic, <50% confined to bed  . Vitals:    01/03/17 1156  BP: (!) 172/68  Pulse: 64  Resp: 12  Temp: 97.6 F (36.4 C)  SpO2: 99%   Filed Weights   01/03/17 1156  Weight: 145 lb 6.4 oz (66 kg)   .Body mass index is 23.47 kg/m.  GENERAL:alert, in no acute distress and comfortable SKIN: no acute rashes, no significant lesions EYES: conjunctiva are pink and non-injected, sclera anicteric OROPHARYNX: MMM, no exudates, no oropharyngeal erythema or ulceration NECK: supple, no JVD LYMPH:  no palpable lymphadenopathy in the cervical, axillary or inguinal regions LUNGS: clear to auscultation b/l with normal respiratory effort HEART Irregular heart rate EN:  normoactive bowel sounds , non tender, not distended.No palpable hepatosplenomegaly  Extremity: no pedal edema PSYCH: alert & oriented x 3 with fluent speech NEURO: no focal motor/sensory deficits  LABORATORY DATA:  I have reviewed the data as listed  .Marland Kitchen CBC Latest Ref Rng & Units 01/03/2017 12/20/2016 12/06/2016  WBC 4.0 - 10.3 10e3/uL 4.0 5.5 5.7  Hemoglobin 13.0 - 17.1 g/dL 9.1(L) 8.6(L) 8.0(L)  Hematocrit 38.4 - 49.9 % 28.3(L) 27.1(L) 24.9(L)  Platelets 140 - 400 10e3/uL 265 247 198   . CMP Latest Ref Rng & Units 12/20/2016 11/18/2016 10/25/2016  Glucose 70 - 140 mg/dl 86 79 80  BUN 7.0 - 26.0 mg/dL 22.3 17 19.1  Creatinine 0.7 - 1.3 mg/dL 1.4(H) 1.38(H) 1.5(H)  Sodium 136 - 145 mEq/L 140 136 139  Potassium 3.5 - 5.1 mEq/L 4.0 3.9 4.5  Chloride 101 - 111 mmol/L - 105 -  CO2 22 - 29 mEq/L 22 20(L) 21(L)  Calcium 8.4 - 10.4 mg/dL 8.6 8.1(L) 8.6  Total Protein 6.4 - 8.3 g/dL 7.0 - 6.8  Total Bilirubin 0.20 - 1.20 mg/dL 0.73 - 0.81  Alkaline Phos 40 - 150 U/L 90 - 59  AST 5 - 34 U/L 19 - 31  ALT 0 - 55 U/L 11 - 18   Component     Latest Ref Rng & Units 06/29/2016 08/02/2016  IgG (Immunoglobin G), Serum     700 - 1600 mg/dL 1,430   IgA/Immunoglobulin A, Serum     61 - 437 mg/dL 373   IgM, Qn, Serum  15 - 143 mg/dL 91   Total Protein     6.0 - 8.5 g/dL  7.2   Albumin SerPl Elph-Mcnc     2.9 - 4.4 g/dL 3.8   Alpha 1     0.0 - 0.4 g/dL 0.3   Alpha2 Glob SerPl Elph-Mcnc     0.4 - 1.0 g/dL 0.6   B-Globulin SerPl Elph-Mcnc     0.7 - 1.3 g/dL 1.0   Gamma Glob SerPl Elph-Mcnc     0.4 - 1.8 g/dL 1.6   M Protein SerPl Elph-Mcnc     Not Observed g/dL Not Observed   Globulin, Total     2.2 - 3.9 g/dL 3.4   Albumin/Glob SerPl     0.7 - 1.7 1.2   IFE 1      Comment   Please Note (HCV):      Comment   Iron     42 - 163 ug/dL 152   TIBC     202 - 409 ug/dL 283   UIBC     117 - 376 ug/dL 130   %SAT     20 - 55 % 54   Folate, Hemolysate     Not Estab. ng/mL 448.8   HCT     37.5 - 51.0 % 29.2 (L)   Folate, RBC     >498 ng/mL 1,537   Ig Kappa Free Light Chain     3.3 - 19.4 mg/L 48.7 (H)   Ig Lambda Free Light Chain     5.7 - 26.3 mg/L 49.4 (H)   Kappa/Lambda FluidC Ratio     0.26 - 1.65 0.99   LDH     125 - 245 U/L 180   Haptoglobin     34 - 200 mg/dL 81   Ferritin     22 - 316 ng/ml 217 191  Vitamin B12     232 - 1,245 pg/mL 519   Erythropoietin     2.6 - 18.5 mIU/mL 12.7   Sed Rate     0 - 30 mm/hr 22    . Lab Results  Component Value Date   IRON 82 12/20/2016   TIBC 264 12/20/2016   IRONPCTSAT 31 12/20/2016   (Iron and TIBC)  Lab Results  Component Value Date   FERRITIN 118 12/20/2016     RADIOGRAPHIC STUDIES: I have personally reviewed the radiological images as listed and agreed with the findings in the report. No results found.  ASSESSMENT & PLAN:   83 year old gentleman with multiple medical comorbidities with   #1 Chronic normocytic anemia with a hemoglobin in the 9-10 range. This is likely multifactorial Has had heme positive stools which in colonoscopy were thought to be related to radiation proctitis. This could likely cause slow continuous losses. He also has had issues with hematuria which are being monitored by Dr. Alinda Barnett in the setting of his history of kidney cancer bladder cancer and  prostate cancer. At his age one would expect at least some element of MDS. Patient is not keen to do a bone marrow examination. Peripheral blood does reveal some changes suggestive of MDS. Part of his anemia is also likely related to anemia of chronic kidney disease and the fact that he has only a single kidney. Also could have lower testosterone levels however replacement would not be option considering his medical history   Hgb is 9.1 today, Ferritin 118 two weeks ago. We again discussed that this is likely related to MDS; he still  would like to undergo EPO injections before we consider bone marrow examination.  Recently had a fall with traumatic hematoma that led to some drop in hgb which is now improving again. Plan - lab results were discussed in details with the patient. --Aranesp dose increased to 251mcg q2weeks and adjust dose per response. He has tolerated this well. We will keep this dose for the next three months and consider dose adjustment at that time.  -will need IV Iron replacement to maintain ferritin level >100. We will schedule this.  -He shall be continued to follow with urology for appropriate follow-up screening for his RCC, bladder cancer and prostate cancer.   -Continue Aranesp q2weeks -IV Injectafer in 3 weeks x 1 dose -Labs monthly -RTC with Dr Irene Limbo in 3 months    All of the patients questions were answered with apparent satisfaction. The patient knows to call the clinic with any problems, questions or concerns.  I spent 20 minutes counseling the patient face to face. The total time spent in the appointment was 20 minutes and more than 50% was on counseling and direct patient cares.    Alexander Lone MD [place] AAHIVMS [facility] Hematology/Oncology Physician [facility]  (Office):       [phone] (Work cell):  [phone] (Fax):           [phone]  This document serves as a record of services personally performed by Alexander Lone, MD. It was  created on his behalf by Reola Mosher, a trained medical scribe. The creation of this record is based on the scribe's personal observations and the provider's statements to them.   .I have reviewed the above documentation for accuracy and completeness, and I agree with the above. Alexander Genera MD MS

## 2017-01-03 NOTE — Patient Instructions (Signed)

## 2017-01-05 ENCOUNTER — Telehealth: Payer: Self-pay | Admitting: Hematology

## 2017-01-05 NOTE — Telephone Encounter (Signed)
Mailed letter to patient regarding upcoming January through June appointments.

## 2017-01-17 ENCOUNTER — Encounter (HOSPITAL_BASED_OUTPATIENT_CLINIC_OR_DEPARTMENT_OTHER): Payer: Medicare Other

## 2017-01-17 ENCOUNTER — Other Ambulatory Visit (HOSPITAL_BASED_OUTPATIENT_CLINIC_OR_DEPARTMENT_OTHER): Payer: Medicare Other

## 2017-01-17 ENCOUNTER — Ambulatory Visit: Payer: Medicare Other

## 2017-01-17 DIAGNOSIS — D4620 Refractory anemia with excess of blasts, unspecified: Secondary | ICD-10-CM

## 2017-01-17 DIAGNOSIS — N183 Chronic kidney disease, stage 3 unspecified: Secondary | ICD-10-CM

## 2017-01-17 DIAGNOSIS — D649 Anemia, unspecified: Secondary | ICD-10-CM

## 2017-01-17 LAB — CBC & DIFF AND RETIC
BASO%: 2.9 % — AB (ref 0.0–2.0)
Basophils Absolute: 0.2 10*3/uL — ABNORMAL HIGH (ref 0.0–0.1)
EOS ABS: 0 10*3/uL (ref 0.0–0.5)
EOS%: 0.6 % (ref 0.0–7.0)
HCT: 30.7 % — ABNORMAL LOW (ref 38.4–49.9)
HEMOGLOBIN: 9.6 g/dL — AB (ref 13.0–17.1)
Immature Retic Fract: 7.7 % (ref 3.00–10.60)
LYMPH%: 12.8 % — AB (ref 14.0–49.0)
MCH: 27 pg — ABNORMAL LOW (ref 27.2–33.4)
MCHC: 31.3 g/dL — ABNORMAL LOW (ref 32.0–36.0)
MCV: 86.2 fL (ref 79.3–98.0)
MONO#: 1 10*3/uL — ABNORMAL HIGH (ref 0.1–0.9)
MONO%: 15.1 % — AB (ref 0.0–14.0)
NEUT%: 68.6 % (ref 39.0–75.0)
NEUTROS ABS: 4.6 10*3/uL (ref 1.5–6.5)
PLATELETS: 240 10*3/uL (ref 140–400)
RBC: 3.56 10*6/uL — ABNORMAL LOW (ref 4.20–5.82)
RDW: 20.3 % — ABNORMAL HIGH (ref 11.0–14.6)
Retic %: 0.7 % — ABNORMAL LOW (ref 0.80–1.80)
Retic Ct Abs: 24.92 10*3/uL — ABNORMAL LOW (ref 34.80–93.90)
WBC: 6.6 10*3/uL (ref 4.0–10.3)
lymph#: 0.9 10*3/uL (ref 0.9–3.3)

## 2017-01-17 MED ORDER — DARBEPOETIN ALFA 200 MCG/0.4ML IJ SOSY
PREFILLED_SYRINGE | INTRAMUSCULAR | Status: AC
  Filled 2017-01-17: qty 0.4

## 2017-01-17 MED ORDER — DARBEPOETIN ALFA 200 MCG/0.4ML IJ SOSY
200.0000 ug | PREFILLED_SYRINGE | Freq: Once | INTRAMUSCULAR | Status: AC
  Administered 2017-01-17: 200 ug via SUBCUTANEOUS

## 2017-01-17 NOTE — Patient Instructions (Signed)

## 2017-01-30 ENCOUNTER — Other Ambulatory Visit: Payer: Self-pay | Admitting: *Deleted

## 2017-01-30 MED ORDER — ATENOLOL 25 MG PO TABS: 25.0000 mg | ORAL_TABLET | Freq: Every day | ORAL | 0 refills | Status: AC

## 2017-01-31 ENCOUNTER — Inpatient Hospital Stay: Payer: Medicare Other | Attending: Hematology

## 2017-01-31 ENCOUNTER — Inpatient Hospital Stay: Payer: Medicare Other

## 2017-01-31 VITALS — BP 124/53 | HR 69 | Temp 98.5°F | Resp 18

## 2017-01-31 DIAGNOSIS — D4620 Refractory anemia with excess of blasts, unspecified: Secondary | ICD-10-CM

## 2017-01-31 DIAGNOSIS — D631 Anemia in chronic kidney disease: Secondary | ICD-10-CM | POA: Diagnosis present

## 2017-01-31 DIAGNOSIS — N183 Chronic kidney disease, stage 3 unspecified: Secondary | ICD-10-CM

## 2017-01-31 DIAGNOSIS — D649 Anemia, unspecified: Secondary | ICD-10-CM

## 2017-01-31 LAB — CBC WITH DIFFERENTIAL (CANCER CENTER ONLY)
BASOS ABS: 0.2 10*3/uL — AB (ref 0.0–0.1)
Basophils Relative: 2 %
EOS ABS: 0.1 10*3/uL (ref 0.0–0.5)
Eosinophils Relative: 1 %
HCT: 26.5 % — ABNORMAL LOW (ref 38.4–49.9)
HEMOGLOBIN: 8.6 g/dL — AB (ref 13.0–17.1)
LYMPHS ABS: 1.2 10*3/uL (ref 0.9–3.3)
LYMPHS PCT: 14 %
MCH: 26.6 pg — AB (ref 27.2–33.4)
MCHC: 32.5 g/dL (ref 32.0–36.0)
MCV: 82 fL (ref 79.3–98.0)
Monocytes Absolute: 0.7 10*3/uL (ref 0.1–0.9)
Monocytes Relative: 8 %
NEUTROS PCT: 75 %
Neutro Abs: 6.4 10*3/uL (ref 1.5–6.5)
Platelet Count: 201 10*3/uL (ref 140–400)
RBC: 3.23 MIL/uL — AB (ref 4.20–5.82)
RDW: 20.9 % — ABNORMAL HIGH (ref 11.0–15.6)
WBC: 8.4 10*3/uL (ref 4.0–10.3)

## 2017-01-31 LAB — RETICULOCYTES
RBC.: 3.23 MIL/uL — ABNORMAL LOW (ref 4.20–5.82)
RETIC COUNT ABSOLUTE: 29.1 10*3/uL — AB (ref 34.8–93.9)
RETIC CT PCT: 0.9 % (ref 0.8–1.8)

## 2017-01-31 MED ORDER — SODIUM CHLORIDE 0.9 % IV SOLN
750.0000 mg | Freq: Once | INTRAVENOUS | Status: AC
  Administered 2017-01-31: 750 mg via INTRAVENOUS
  Filled 2017-01-31: qty 15

## 2017-01-31 NOTE — Patient Instructions (Signed)
Iron Deficiency Anemia, Adult Iron deficiency anemia is a condition in which the concentration of red blood cells or hemoglobin in the blood is below normal because of too little iron. Hemoglobin is a substance in red blood cells that carries oxygen to the body's tissues. When the concentration of red blood cells or hemoglobin is too low, not enough oxygen reaches these tissues. Iron deficiency anemia is usually long-lasting (chronic) and it develops over time. It may or may not cause symptoms. It is a common type of anemia. What are the causes? This condition may be caused by:  Not enough iron in the diet.  Blood loss caused by bleeding in the intestine.  Blood loss from a gastrointestinal condition like Crohn disease.  Frequent blood draws, such as from blood donation.  Abnormal absorption in the gut.  Heavy menstrual periods in women.  Cancers of the gastrointestinal system, such as colon cancer.  What are the signs or symptoms? Symptoms of this condition may include:  Fatigue.  Headache.  Pale skin, lips, and nail beds.  Poor appetite.  Weakness.  Shortness of breath.  Dizziness.  Cold hands and feet.  Fast or irregular heartbeat.  Irritability. This is more common in severe anemia.  Rapid breathing. This is more common in severe anemia.  Mild anemia may not cause any symptoms. How is this diagnosed? This condition is diagnosed based on:  Your medical history.  A physical exam.  Blood tests.  You may have additional tests to find the underlying cause of your anemia, such as:  Testing for blood in the stool (fecal occult blood test).  A procedure to see inside your colon and rectum (colonoscopy).  A procedure to see inside your esophagus and stomach (endoscopy).  A test in which cells are removed from bone marrow (bone marrow aspiration) or fluid is removed from the bone marrow to be examined (biopsy). This is rarely needed.  How is this  treated? This condition is treated by correcting the cause of your iron deficiency. Treatment may involve:  Adding iron-rich foods to your diet.  Taking iron supplements. If you are pregnant or breastfeeding, you may need to take extra iron because your normal diet usually does not provide the amount of iron that you need.  Increasing vitamin C intake. Vitamin C helps your body absorb iron. Your health care provider may recommend that you take iron supplements along with a glass of orange juice or a vitamin C supplement.  Medicines to make heavy menstrual flow lighter.  Surgery.  You may need repeat blood tests to determine whether treatment is working. Depending on the underlying cause, the anemia should be corrected within 2 months of starting treatment. If the treatment does not seem to be working, you may need more testing. Follow these instructions at home: Medicines  Take over-the-counter and prescription medicines only as told by your health care provider. This includes iron supplements and vitamins.  If you cannot tolerate taking iron supplements by mouth, talk with your health care provider about taking them through a vein (intravenously) or an injection into a muscle.  For the best iron absorption, you should take iron supplements when your stomach is empty. If you cannot tolerate them on an empty stomach, you may need to take them with food.  Do not drink milk or take antacids at the same time as your iron supplements. Milk and antacids may interfere with iron absorption.  Iron supplements can cause constipation. To prevent constipation, include fiber   in your diet as told by your health care provider. A stool softener may also be recommended. Eating and drinking  Talk with your health care provider before changing your diet. He or she may recommend that you eat foods that contain a lot of iron, such as: ? Liver. ? Low-fat (lean) beef. ? Breads and cereals that have iron  added to them (are fortified). ? Eggs. ? Dried fruit. ? Dark green, leafy vegetables.  To help your body use the iron from iron-rich foods, eat those foods at the same time as fresh fruits and vegetables that are high in vitamin C. Foods that are high in vitamin C include: ? Oranges. ? Peppers. ? Tomatoes. ? Mangoes.  Drinkenoughfluid to keep your urine clear or pale yellow. General instructions  Return to your normal activities as told by your health care provider. Ask your health care provider what activities are safe for you.  Practice good hygiene. Anemia can make you more prone to illness and infection.  Keep all follow-up visits as told by your health care provider. This is important. Contact a health care provider if:  You feel nauseous or you vomit.  You feel weak.  You have unexplained sweating.  You develop symptoms of constipation, such as: ? Having fewer than three bowel movements a week. ? Straining to have a bowel movement. ? Having stools that are hard, dry, or larger than normal. ? Feeling full or bloated. ? Pain in the lower abdomen. ? Not feeling relief after having a bowel movement. Get help right away if:  You faint. If this happens, do not drive yourself to the hospital. Call your local emergency services (911 in the [place]  You have chest pain.  You have shortness of breath that: ? Is severe. ? Gets worse with physical activity.  You have a rapid heartbeat.  You become light-headed when getting up from a sitting or lying down position. This information is not intended to replace advice given to you by your health care provider. Make sure you discuss any questions you have with your health care provider. Document Released: 12/31/1999 Document Revised: 09/22/2015 Document Reviewed: 09/22/2015 Elsevier Interactive Patient Education  2018 [employer]  

## 2017-02-01 ENCOUNTER — Telehealth: Payer: Self-pay | Admitting: Hematology

## 2017-02-01 ENCOUNTER — Telehealth: Payer: Self-pay

## 2017-02-01 NOTE — Telephone Encounter (Signed)
Pt called to confirm that he received a message from Ginette Otto, scheduler, notifying him of his lab and injection appointments on 1/23 at 1115. Pt will be here for his appointments.

## 2017-02-01 NOTE — Telephone Encounter (Signed)
Scheduled appts per 1/16 sch msg - left voicemail for patient regarding appts.

## 2017-02-07 ENCOUNTER — Inpatient Hospital Stay: Payer: Medicare Other

## 2017-02-07 VITALS — BP 130/60 | HR 68 | Temp 98.1°F | Resp 18

## 2017-02-07 DIAGNOSIS — Z8546 Personal history of malignant neoplasm of prostate: Secondary | ICD-10-CM

## 2017-02-07 DIAGNOSIS — D4620 Refractory anemia with excess of blasts, unspecified: Secondary | ICD-10-CM

## 2017-02-07 DIAGNOSIS — N183 Chronic kidney disease, stage 3 unspecified: Secondary | ICD-10-CM

## 2017-02-07 DIAGNOSIS — Z905 Acquired absence of kidney: Secondary | ICD-10-CM

## 2017-02-07 DIAGNOSIS — D649 Anemia, unspecified: Secondary | ICD-10-CM

## 2017-02-07 DIAGNOSIS — E440 Moderate protein-calorie malnutrition: Secondary | ICD-10-CM | POA: Diagnosis present

## 2017-02-07 DIAGNOSIS — Z87891 Personal history of nicotine dependence: Secondary | ICD-10-CM

## 2017-02-07 DIAGNOSIS — J9601 Acute respiratory failure with hypoxia: Secondary | ICD-10-CM | POA: Diagnosis present

## 2017-02-07 DIAGNOSIS — I129 Hypertensive chronic kidney disease with stage 1 through stage 4 chronic kidney disease, or unspecified chronic kidney disease: Secondary | ICD-10-CM | POA: Diagnosis present

## 2017-02-07 DIAGNOSIS — Z85828 Personal history of other malignant neoplasm of skin: Secondary | ICD-10-CM

## 2017-02-07 DIAGNOSIS — E1122 Type 2 diabetes mellitus with diabetic chronic kidney disease: Secondary | ICD-10-CM | POA: Diagnosis present

## 2017-02-07 DIAGNOSIS — Z8551 Personal history of malignant neoplasm of bladder: Secondary | ICD-10-CM

## 2017-02-07 DIAGNOSIS — Z85528 Personal history of other malignant neoplasm of kidney: Secondary | ICD-10-CM

## 2017-02-07 DIAGNOSIS — Z6822 Body mass index (BMI) 22.0-22.9, adult: Secondary | ICD-10-CM

## 2017-02-07 DIAGNOSIS — J121 Respiratory syncytial virus pneumonia: Principal | ICD-10-CM | POA: Diagnosis present

## 2017-02-07 DIAGNOSIS — I350 Nonrheumatic aortic (valve) stenosis: Secondary | ICD-10-CM | POA: Diagnosis present

## 2017-02-07 DIAGNOSIS — D469 Myelodysplastic syndrome, unspecified: Secondary | ICD-10-CM | POA: Diagnosis present

## 2017-02-07 LAB — CBC WITH DIFFERENTIAL (CANCER CENTER ONLY)
BASOS ABS: 0.1 10*3/uL (ref 0.0–0.1)
BASOS PCT: 1 %
Eosinophils Absolute: 0.1 10*3/uL (ref 0.0–0.5)
Eosinophils Relative: 1 %
HEMATOCRIT: 25.7 % — AB (ref 38.4–49.9)
HEMOGLOBIN: 8.2 g/dL — AB (ref 13.0–17.1)
Lymphocytes Relative: 8 %
Lymphs Abs: 0.9 10*3/uL (ref 0.9–3.3)
MCH: 26.5 pg — ABNORMAL LOW (ref 27.2–33.4)
MCHC: 31.9 g/dL — ABNORMAL LOW (ref 32.0–36.0)
MCV: 82.9 fL (ref 79.3–98.0)
MONOS PCT: 13 %
Monocytes Absolute: 1.4 10*3/uL — ABNORMAL HIGH (ref 0.1–0.9)
NEUTROS ABS: 8.5 10*3/uL — AB (ref 1.5–6.5)
NEUTROS PCT: 77 %
Platelet Count: 223 10*3/uL (ref 140–400)
RBC: 3.1 MIL/uL — AB (ref 4.20–5.82)
RDW: 21 % — ABNORMAL HIGH (ref 11.0–15.6)
WBC: 11 10*3/uL — AB (ref 4.0–10.3)

## 2017-02-07 LAB — RETICULOCYTES
RBC.: 3.1 MIL/uL — AB (ref 4.20–5.82)
Retic Count, Absolute: 12.4 10*3/uL — ABNORMAL LOW (ref 34.8–93.9)
Retic Ct Pct: 0.4 % — ABNORMAL LOW (ref 0.8–1.8)

## 2017-02-07 MED ORDER — DARBEPOETIN ALFA 200 MCG/0.4ML IJ SOSY
PREFILLED_SYRINGE | INTRAMUSCULAR | Status: AC
  Filled 2017-02-07: qty 0.4

## 2017-02-07 MED ORDER — DARBEPOETIN ALFA 200 MCG/0.4ML IJ SOSY
200.0000 ug | PREFILLED_SYRINGE | Freq: Once | INTRAMUSCULAR | Status: AC
  Administered 2017-02-07: 200 ug via SUBCUTANEOUS

## 2017-02-08 ENCOUNTER — Other Ambulatory Visit: Payer: Self-pay

## 2017-02-08 ENCOUNTER — Encounter (HOSPITAL_COMMUNITY): Payer: Self-pay

## 2017-02-08 ENCOUNTER — Emergency Department (HOSPITAL_COMMUNITY): Payer: Medicare Other

## 2017-02-08 ENCOUNTER — Inpatient Hospital Stay (HOSPITAL_COMMUNITY)
Admission: EM | Admit: 2017-02-08 | Discharge: 2017-02-12 | DRG: 193 | Disposition: A | Discharge status: Home Health | Payer: Medicare Other | Attending: Family Medicine | Admitting: Family Medicine

## 2017-02-08 ENCOUNTER — Telehealth: Payer: Self-pay | Admitting: *Deleted

## 2017-02-08 DIAGNOSIS — Z8551 Personal history of malignant neoplasm of bladder: Secondary | ICD-10-CM | POA: Diagnosis not present

## 2017-02-08 DIAGNOSIS — E119 Type 2 diabetes mellitus without complications: Secondary | ICD-10-CM

## 2017-02-08 DIAGNOSIS — I359 Nonrheumatic aortic valve disorder, unspecified: Secondary | ICD-10-CM | POA: Diagnosis not present

## 2017-02-08 DIAGNOSIS — I350 Nonrheumatic aortic (valve) stenosis: Secondary | ICD-10-CM | POA: Diagnosis present

## 2017-02-08 DIAGNOSIS — D469 Myelodysplastic syndrome, unspecified: Secondary | ICD-10-CM | POA: Diagnosis present

## 2017-02-08 DIAGNOSIS — J121 Respiratory syncytial virus pneumonia: Secondary | ICD-10-CM | POA: Diagnosis not present

## 2017-02-08 DIAGNOSIS — Z85528 Personal history of other malignant neoplasm of kidney: Secondary | ICD-10-CM | POA: Diagnosis not present

## 2017-02-08 DIAGNOSIS — N183 Chronic kidney disease, stage 3 unspecified: Secondary | ICD-10-CM | POA: Diagnosis present

## 2017-02-08 DIAGNOSIS — I272 Pulmonary hypertension, unspecified: Secondary | ICD-10-CM | POA: Diagnosis not present

## 2017-02-08 DIAGNOSIS — Z87891 Personal history of nicotine dependence: Secondary | ICD-10-CM | POA: Diagnosis not present

## 2017-02-08 DIAGNOSIS — D649 Anemia, unspecified: Secondary | ICD-10-CM | POA: Diagnosis present

## 2017-02-08 DIAGNOSIS — R55 Syncope and collapse: Secondary | ICD-10-CM | POA: Diagnosis not present

## 2017-02-08 DIAGNOSIS — I129 Hypertensive chronic kidney disease with stage 1 through stage 4 chronic kidney disease, or unspecified chronic kidney disease: Secondary | ICD-10-CM | POA: Diagnosis present

## 2017-02-08 DIAGNOSIS — J9600 Acute respiratory failure, unspecified whether with hypoxia or hypercapnia: Secondary | ICD-10-CM | POA: Diagnosis not present

## 2017-02-08 DIAGNOSIS — E440 Moderate protein-calorie malnutrition: Secondary | ICD-10-CM | POA: Diagnosis present

## 2017-02-08 DIAGNOSIS — Z905 Acquired absence of kidney: Secondary | ICD-10-CM | POA: Diagnosis not present

## 2017-02-08 DIAGNOSIS — Z6822 Body mass index (BMI) 22.0-22.9, adult: Secondary | ICD-10-CM | POA: Diagnosis not present

## 2017-02-08 DIAGNOSIS — J9601 Acute respiratory failure with hypoxia: Secondary | ICD-10-CM | POA: Diagnosis present

## 2017-02-08 DIAGNOSIS — I700 Atherosclerosis of aorta: Secondary | ICD-10-CM

## 2017-02-08 DIAGNOSIS — Z8546 Personal history of malignant neoplasm of prostate: Secondary | ICD-10-CM | POA: Diagnosis not present

## 2017-02-08 DIAGNOSIS — J189 Pneumonia, unspecified organism: Secondary | ICD-10-CM | POA: Diagnosis not present

## 2017-02-08 DIAGNOSIS — E1129 Type 2 diabetes mellitus with other diabetic kidney complication: Secondary | ICD-10-CM | POA: Diagnosis not present

## 2017-02-08 DIAGNOSIS — E1122 Type 2 diabetes mellitus with diabetic chronic kidney disease: Secondary | ICD-10-CM | POA: Diagnosis present

## 2017-02-08 DIAGNOSIS — Z85828 Personal history of other malignant neoplasm of skin: Secondary | ICD-10-CM | POA: Diagnosis not present

## 2017-02-08 LAB — BASIC METABOLIC PANEL
Anion gap: 8 (ref 5–15)
BUN: 29 mg/dL — ABNORMAL HIGH (ref 6–20)
CO2: 24 mmol/L (ref 22–32)
Calcium: 9.2 mg/dL (ref 8.9–10.3)
Chloride: 104 mmol/L (ref 101–111)
Creatinine, Ser: 1.46 mg/dL — ABNORMAL HIGH (ref 0.61–1.24)
GFR calc Af Amer: 46 mL/min — ABNORMAL LOW (ref >60)
GFR calc non Af Amer: 39 mL/min — ABNORMAL LOW (ref >60)
Glucose, Bld: 120 mg/dL — ABNORMAL HIGH (ref 65–99)
Potassium: 4.2 mmol/L (ref 3.5–5.1)
Sodium: 136 mmol/L (ref 135–145)

## 2017-02-08 LAB — URINALYSIS, ROUTINE W REFLEX MICROSCOPIC
Bilirubin Urine: NEGATIVE
Glucose, UA: NEGATIVE mg/dL
Hgb urine dipstick: NEGATIVE
Ketones, ur: NEGATIVE mg/dL
Nitrite: NEGATIVE
Protein, ur: 100 mg/dL — AB
Specific Gravity, Urine: 1.017 (ref 1.005–1.030)
pH: 8 (ref 5.0–8.0)

## 2017-02-08 LAB — CBC WITH DIFFERENTIAL/PLATELET
BASOS PCT: 0 %
Basophils Absolute: 0 10*3/uL (ref 0.0–0.1)
EOS PCT: 0 %
Eosinophils Absolute: 0 10*3/uL (ref 0.0–0.7)
HEMATOCRIT: 27.3 % — AB (ref 39.0–52.0)
HEMOGLOBIN: 8.7 g/dL — AB (ref 13.0–17.0)
LYMPHS PCT: 4 %
Lymphs Abs: 1 10*3/uL (ref 0.7–4.0)
MCH: 26.1 pg (ref 26.0–34.0)
MCHC: 31.9 g/dL (ref 30.0–36.0)
MCV: 82 fL (ref 78.0–100.0)
Monocytes Absolute: 2.3 10*3/uL — ABNORMAL HIGH (ref 0.1–1.0)
Monocytes Relative: 9 %
NEUTROS PCT: 87 %
Neutro Abs: 21.8 10*3/uL — ABNORMAL HIGH (ref 1.7–7.7)
Platelets: 198 10*3/uL (ref 150–400)
RBC: 3.33 MIL/uL — AB (ref 4.22–5.81)
RDW: 21.2 % — AB (ref 11.5–15.5)
WBC: 25.1 10*3/uL — ABNORMAL HIGH (ref 4.0–10.5)

## 2017-02-08 LAB — GLUCOSE, CAPILLARY: Glucose-Capillary: 129 mg/dL — ABNORMAL HIGH (ref 65–99)

## 2017-02-08 MED ORDER — CEFTRIAXONE SODIUM 1 G IJ SOLR
1.0000 g | Freq: Once | INTRAMUSCULAR | Status: DC
  Filled 2017-02-08: qty 10

## 2017-02-08 MED ORDER — AMLODIPINE BESYLATE 5 MG PO TABS
5.0000 mg | ORAL_TABLET | Freq: Every day | ORAL | Status: DC
  Administered 2017-02-09 – 2017-02-12 (×4): 5 mg via ORAL
  Filled 2017-02-08 (×4): qty 1

## 2017-02-08 MED ORDER — ACETAMINOPHEN 325 MG PO TABS
650.0000 mg | ORAL_TABLET | Freq: Four times a day (QID) | ORAL | Status: DC | PRN
  Administered 2017-02-08 – 2017-02-12 (×9): 650 mg via ORAL
  Filled 2017-02-08 (×9): qty 2

## 2017-02-08 MED ORDER — ENOXAPARIN SODIUM 30 MG/0.3ML ~~LOC~~ SOLN
30.0000 mg | SUBCUTANEOUS | Status: DC
  Administered 2017-02-11: 30 mg via SUBCUTANEOUS
  Filled 2017-02-08 (×3): qty 0.3

## 2017-02-08 MED ORDER — INSULIN ASPART 100 UNIT/ML ~~LOC~~ SOLN: 0.0000 [IU] | Freq: Every day | SUBCUTANEOUS | Status: DC

## 2017-02-08 MED ORDER — CO Q-10 100 MG PO CAPS: 1.0000 | ORAL_CAPSULE | Freq: Every day | ORAL | Status: DC

## 2017-02-08 MED ORDER — INSULIN ASPART 100 UNIT/ML ~~LOC~~ SOLN
0.0000 [IU] | Freq: Three times a day (TID) | SUBCUTANEOUS | Status: DC
  Administered 2017-02-09 – 2017-02-11 (×4): 1 [IU] via SUBCUTANEOUS

## 2017-02-08 MED ORDER — ORAL CARE MOUTH RINSE
15.0000 mL | Freq: Two times a day (BID) | OROMUCOSAL | Status: DC
  Administered 2017-02-09: 15 mL via OROMUCOSAL

## 2017-02-08 MED ORDER — STERILE WATER FOR INJECTION IJ SOLN
INTRAMUSCULAR | Status: AC
  Filled 2017-02-08: qty 10

## 2017-02-08 MED ORDER — CEFTRIAXONE SODIUM 1 G IJ SOLR
1.0000 g | INTRAMUSCULAR | Status: DC
  Administered 2017-02-09 – 2017-02-11 (×3): 1 g via INTRAVENOUS
  Filled 2017-02-08 (×3): qty 10

## 2017-02-08 MED ORDER — ADULT MULTIVITAMIN W/MINERALS CH
1.0000 | ORAL_TABLET | Freq: Every day | ORAL | Status: DC
  Administered 2017-02-10 – 2017-02-12 (×3): 1 via ORAL
  Filled 2017-02-08 (×4): qty 1

## 2017-02-08 MED ORDER — SODIUM CHLORIDE 0.9 % IV SOLN
INTRAVENOUS | Status: DC
  Administered 2017-02-08: 22:00:00 via INTRAVENOUS

## 2017-02-08 MED ORDER — ATENOLOL 25 MG PO TABS
25.0000 mg | ORAL_TABLET | Freq: Every day | ORAL | Status: DC
  Administered 2017-02-09 – 2017-02-12 (×4): 25 mg via ORAL
  Filled 2017-02-08 (×4): qty 1

## 2017-02-08 MED ORDER — TRAVOPROST 0.004 % OP SOLN: 1.0000 [drp] | Freq: Every day | OPHTHALMIC | Status: DC

## 2017-02-08 MED ORDER — DEXTROSE 5 % IV SOLN
500.0000 mg | INTRAVENOUS | Status: DC
  Administered 2017-02-08: 500 mg via INTRAVENOUS
  Filled 2017-02-08: qty 500

## 2017-02-08 MED ORDER — LOSARTAN POTASSIUM 50 MG PO TABS
100.0000 mg | ORAL_TABLET | Freq: Every day | ORAL | Status: DC
  Administered 2017-02-09 – 2017-02-12 (×4): 100 mg via ORAL
  Filled 2017-02-08 (×4): qty 2

## 2017-02-08 MED ORDER — VITAMIN D3 25 MCG (1000 UNIT) PO TABS
1000.0000 [IU] | ORAL_TABLET | Freq: Every day | ORAL | Status: DC
  Administered 2017-02-09 – 2017-02-12 (×4): 1000 [IU] via ORAL
  Filled 2017-02-08 (×4): qty 1

## 2017-02-08 MED ORDER — LATANOPROST 0.005 % OP SOLN
1.0000 [drp] | Freq: Every day | OPHTHALMIC | Status: DC
  Administered 2017-02-08 – 2017-02-11 (×4): 1 [drp] via OPHTHALMIC
  Filled 2017-02-08: qty 2.5

## 2017-02-08 MED ORDER — FINASTERIDE 5 MG PO TABS
5.0000 mg | ORAL_TABLET | Freq: Every morning | ORAL | Status: DC
  Administered 2017-02-09 – 2017-02-12 (×4): 5 mg via ORAL
  Filled 2017-02-08 (×4): qty 1

## 2017-02-08 MED ORDER — SODIUM CHLORIDE 0.9 % IV BOLUS (SEPSIS)
1000.0000 mL | Freq: Once | INTRAVENOUS | Status: AC
  Administered 2017-02-08: 1000 mL via INTRAVENOUS

## 2017-02-08 MED ORDER — DEXTROSE 5 % IV SOLN
1.0000 g | Freq: Once | INTRAVENOUS | Status: AC
  Administered 2017-02-08: 1 g via INTRAVENOUS
  Filled 2017-02-08: qty 10

## 2017-02-08 NOTE — ED Triage Notes (Signed)
Patient states he is seeing a hematologist fr low iron and low hgb. Patient states he began having dizziness and nausea around 1500 yesterday. Patient states he has been sitting and not attempting to get up because he was afraid of falling.  Patient denies dizziness sitting in the chair in triage.

## 2017-02-08 NOTE — ED Notes (Signed)
Bed: [facility] Expected date:  Expected time:  Means of arrival:  Comments: 

## 2017-02-08 NOTE — ED Provider Notes (Signed)
[facility] DEPT Provider Note   CSN: [id number] Arrival date & time: 02/08/17  1313     History   Chief Complaint Chief Complaint  Patient presents with  . Dizziness    HPI Alexander Barnett is a 84 y.o. male with a past medical history of CKD, prostate cancer, anemia, s/p nephrectomy, who presents to ED for evaluation of 24-hour history of gradually resolving dizziness and nausea.  He is followed by his hematologist for his low iron and low hemoglobin and received an EPO injection yesterday 3 hours before the symptoms began.  This was his second EPO injection (his first one was last week and he denies any similar symptoms other than dry heaving after that injection) that he has ever gotten.  States that he felt like the room was spinning around him whenever he tried to walk.  He states that today the symptoms have gradually resolved today as long as he is sitting and not walking around.  When he is walking, he uses a cane which he states helps with his balance.  No previous history of similar symptoms in the past.  He was alarmed when the symptoms began denies any vision changes, head injuries or falls, headache, numbness in arms or legs, memory changes, vomiting, abdominal pain or chest pain, diarrhea, changes in appetite or changes in medications, urinary symptoms.  His PCP stated that this is less likely to be a reaction to the EPO injection and could instead signify a neurological cause and referred him to the ED for further evaluation.  No previous history of CVA. His only other complaint is cough and congestion for the past several days.  Denies any fevers or shortness of breath.  HPI  Past Medical History:  Diagnosis Date  . Anemia   . Aortic stenosis    moderate by echo 12/2015  . Arthritis    Fingers  . BPH (benign prostatic hyperplasia)   . Bradycardia   . CKD (chronic kidney disease) stage 3, GFR 30-59 ml/min (HCC)   . Diabetes mellitus without  complication ([place])    prediabetes diet controlled  . Diastolic dysfunction   . Diverticulosis of colon   . Erectile dysfunction   . Hematuria   . History of kidney cancer   . History of skin cancer   . Hx of transfusion   . Hypertension   . Persistent atrial fibrillation ([place]) 06/2006   refused anticoagulation  . Prostate cancer ([place])   . Pulmonary HTN ([place]) 12/23/2015   Moderate with PASP 73mmHg  . 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 MDS (myelodysplastic syndrome), low grade ([place]) 09/27/2016  . CKD (chronic kidney disease) 09/27/2016  . Pulmonary HTN ([place]) 12/23/2015  . Hematuria, gross 03/26/2014  . Bradycardia   . CKD (chronic kidney disease) stage 3, GFR 30-59 ml/min (HCC)   . Aortic stenosis   . Diastolic dysfunction   . Hypertension   . Arthritis   . History of skin cancer   . Hematuria   . Anemia   . Hx of transfusion   . History of kidney cancer   . Persistent atrial fibrillation ([place]) 06/17/2006    Past Surgical History:  Procedure Laterality Date  . CYSTOSCOPY N/A 06/24/2012   Procedure: CYSTOSCOPY;  Surgeon: Dutch Gray, MD;  Location: [facility];  Service: Urology;  Laterality: N/A;  . CYSTOSCOPY N/A 08/05/2012  Procedure: CYSTOSCOPY;  Surgeon: Dutch Gray, MD;  Location: [facility];  Service: Urology;  Laterality: N/A;  CYSTO, CLOT EVACUATION, FULGERATION OF BLEEDING AREAS AT BLADDER NECK AND PROSTATIC URETHRA    . EYE SURGERY  02/2004   bil.cataract w/ lens implant  . HERNIA REPAIR  05/2002   right  . hyperbaric treatments     40 treatments for prostate-to try to stop bleeding  . imrt radiation  04/2006   for prostate cancer  . MOHS SURGERY  02/2002   nose  . NEPHRECTOMY  03/2007   left  . PROSTATE SURGERY  12/2003  . PROSTATE SURGERY  03/1998   surgery in prostate area  . targus  04/1998   to prostate  . TONSILLECTOMY     as child  . TRANSURETHRAL RESECTION OF  BLADDER TUMOR N/A 03/29/2014   Procedure: CYSTOSCOPY, CLOT EVACUATION, RIGHT RETROGRADE, TRANSURETHRAL RESECTION OF BLADDER TUMOR (TURBT);  Surgeon: Ardis Hughs, MD;  Location: [facility];  Service: Urology;  Laterality: N/A;  . TRANSURETHRAL RESECTION OF PROSTATE N/A 06/24/2012   Procedure: TRANSURETHRAL RESECTION OF THE PROSTATE (TURP);  Surgeon: Dutch Gray, MD;  Location: [facility];  Service: Urology;  Laterality: N/A;  CYSTOSCOPY, TRANSURETHRAL RESECTION OF PROSTATIC URETHRAL TUMOR   . URINARY SURGERY  03/1998   bleeding       Home Medications    Prior to Admission medications   Medication Sig Start Date End Date Taking? Authorizing Provider  amLODipine (NORVASC) 5 MG tablet Take 1 tablet (5 mg total) by mouth daily. 12/23/15  Yes Turner, Eber Hong, MD  atenolol (TENORMIN) 25 MG tablet Take 1 tablet (25 mg total) by mouth daily. Patient needs to keep 03/02/17 appointment for further refills 01/30/17  Yes Turner, Eber Hong, MD  cholecalciferol (VITAMIN D) 1000 UNITS tablet Take 1,000 Units by mouth daily.   Yes [provider]  Coenzyme Q10 (CO Q-10) 100 MG CAPS Take 1 capsule by mouth daily.    Yes [provider]  diphenoxylate-atropine (LOMOTIL) 2.5-0.025 MG tablet Take 1 tablet by mouth daily as needed for diarrhea or loose stools. 09/22/16  Yes [provider]  finasteride (PROSCAR) 5 MG tablet Take 5 mg by mouth every morning.    Yes [provider]  losartan (COZAAR) 100 MG tablet Take 1 tablet (100 mg total) by mouth daily. Please keep upcoming appt with Dr.Turner in December. Thanks 12/11/16  Yes Turner, Eber Hong, MD  Multiple Vitamin (MULTIVITAMIN WITH MINERALS) TABS Take 1 tablet by mouth daily.   Yes [provider]  Multiple Vitamins-Minerals (ICAPS AREDS 2) CAPS Take 1 capsule by mouth daily.   Yes [provider]  travoprost, benzalkonium, (TRAVATAN) 0.004 % ophthalmic solution Place 1 drop into both eyes at bedtime.   Yes [provider]    Family History History reviewed. No pertinent family history.  Social History Social History   Tobacco Use  . Smoking status: Former Smoker    Packs/day: 1.50    Years: 20.00    Pack years: 30.00    Types: Cigarettes    Last attempt to quit: 06/21/1962    Years since quitting: 54.6  . Smokeless tobacco: Never Used  Substance Use Topics  . Alcohol use: Yes    Alcohol/week: 2.4 oz    Types: 4 Glasses of wine per week    Comment: glass wine  occassionally  . Drug use: No     Allergies   Amoxicillin and Ciprofloxacin   Review of  Systems Review of Systems  Constitutional: Negative for appetite change, chills and fever.  HENT: Negative for ear pain, rhinorrhea, sneezing and sore throat.   Eyes: Negative for photophobia and visual disturbance.  Respiratory: Positive for cough. Negative for chest tightness, shortness of breath and wheezing.   Cardiovascular: Negative for chest pain and palpitations.  Gastrointestinal: Positive for nausea. Negative for abdominal pain, blood in stool, constipation, diarrhea and vomiting.  Genitourinary: Negative for dysuria, hematuria and urgency.  Musculoskeletal: Negative for myalgias.  Skin: Negative for rash.  Neurological: Positive for dizziness. Negative for weakness, light-headedness and headaches.     Physical Exam Updated Vital Signs BP (!) 151/82   Pulse 96   Temp 98.3 F (36.8 C) (Oral)   Resp 18   Ht 5\' 6"  (1.676 m)   Wt 63.5 kg (140 lb)   SpO2 91%   BMI 22.60 kg/m   Physical Exam  Constitutional: He is oriented to person, place, and time. He appears well-developed and well-nourished. No distress.  HENT:  Head: Normocephalic and atraumatic.  Nose: Nose normal.  Eyes: Conjunctivae and EOM are normal. Right eye exhibits no discharge. Left eye exhibits no discharge. No scleral icterus.  Neck: Normal range of motion. Neck supple.  Cardiovascular: Normal rate, regular rhythm, normal heart sounds and  intact distal pulses. Exam reveals no gallop and no friction rub.  No murmur heard. Pulmonary/Chest: Effort normal. No respiratory distress. He has rhonchi in the right lower field and the left lower field.  No signs of respiratory distress or increased work of breathing.  Abdominal: Soft. Bowel sounds are normal. He exhibits no distension. There is no tenderness. There is no guarding.  Musculoskeletal: Normal range of motion. He exhibits no edema.  Neurological: He is alert and oriented to person, place, and time. No cranial nerve deficit or sensory deficit. He exhibits normal muscle tone. Coordination normal.  Alert and oriented to self, family members, place, time and current events.  Pupils reactive. No facial asymmetry noted. Cranial nerves appear grossly intact. Sensation intact to light touch on face, BUE and BLE. Strength 5/5 in BUE and BLE. Normal finger to nose coordination bilaterally.  Skin: Skin is warm and dry. No rash noted.  Psychiatric: He has a normal mood and affect.  Nursing note and vitals reviewed.    ED Treatments / Results  Labs (all labs ordered are listed, but only abnormal results are displayed) Labs Reviewed  BASIC METABOLIC PANEL - Abnormal; Notable for the following components:      Result Value   Glucose, Bld 120 (*)    BUN 29 (*)    Creatinine, Ser 1.46 (*)    GFR calc non Af Amer 39 (*)    GFR calc Af Amer 46 (*)    All other components within normal limits  CBC WITH DIFFERENTIAL/PLATELET - Abnormal; Notable for the following components:   WBC 25.1 (*)    RBC 3.33 (*)    Hemoglobin 8.7 (*)    HCT 27.3 (*)    RDW 21.2 (*)    Neutro Abs 21.8 (*)    Monocytes Absolute 2.3 (*)    All other components within normal limits  URINALYSIS, ROUTINE W REFLEX MICROSCOPIC - Abnormal; Notable for the following components:   APPearance HAZY (*)    Protein, ur 100 (*)    Leukocytes, UA TRACE (*)    Bacteria, UA RARE (*)    Squamous Epithelial / LPF 0-5 (*)     All other components  within normal limits    EKG  EKG Interpretation None       Radiology Dg Chest 2 View  Result Date: 02/08/2017 CLINICAL DATA:  Anemia, low iron, dizziness and nausea beginning at 1500 hours, cough, congestion and crackles, history pulmonary hypertension, diabetes mellitus, stage III chronic kidney disease, diastolic dysfunction, transitional cell carcinoma of the bladder, prostate cancer, former smoker EXAM: CHEST  2 VIEW COMPARISON:  06/20/2012 FINDINGS: Enlargement of cardiac silhouette. Normal mediastinal contours and pulmonary vascularity. Atherosclerotic calcification aorta. Emphysematous and bronchitic changes with calcified granuloma in lower RIGHT chest. Atelectasis versus consolidation in BILATERAL lower lobes greater on RIGHT. Remaining lungs clear. No acute infiltrate, pleural effusion or pneumothorax. Bones demineralized. IMPRESSION: Bibasilar atelectasis versus consolidation greater on RIGHT new since previous exam. Enlargement of cardiac silhouette with underlying emphysematous changes. Aortic atherosclerosis. Electronically Signed   By: Lavonia Dana M.D.   On: 02/08/2017 17:17   Ct Head Wo Contrast  Result Date: 02/08/2017 CLINICAL DATA:  Dizziness and nausea EXAM: CT HEAD WITHOUT CONTRAST TECHNIQUE: Contiguous axial images were obtained from the base of the skull through the vertex without intravenous contrast. COMPARISON:  None. FINDINGS: Brain: No mass lesion, intraparenchymal hemorrhage or extra-axial collection. No evidence of acute cortical infarct. There is periventricular hypoattenuation compatible with chronic microvascular disease. Vascular: No hyperdense vessel or unexpected calcification. Skull: Normal visualized skull base, calvarium and extracranial soft tissues. Sinuses/Orbits: No sinus fluid levels or advanced mucosal thickening. No mastoid effusion. Normal orbits. IMPRESSION: Mild chronic microvascular changes without acute abnormality.  Electronically Signed   By: Ulyses Jarred M.D.   On: 02/08/2017 17:08    Procedures Procedures (including critical care time)  Medications Ordered in ED Medications  sterile water (preservative free) injection (not administered)  cefTRIAXone (ROCEPHIN) 1 g in dextrose 5 % 50 mL IVPB (not administered)  sodium chloride 0.9 % bolus 1,000 mL (1,000 mLs Intravenous New Bag/Given 02/08/17 1837)     Initial Impression / Assessment and Plan / ED Course  I have reviewed the triage vital signs and the nursing notes.  Pertinent labs & imaging results that were available during my care of the patient were reviewed by me and considered in my medical decision making (see chart for details).     Patient presents to ED for evaluation of 24-hour history of emesis, nausea.  He also reports cough and congestion for the past several days.  He denies any fevers, chest pain, shortness of breath, abdominal pain or vomiting.  He did receive an EPO injection yesterday shortly before his symptoms began.  On physical exam he is overall well-appearing.  There is no deficits on his neurological exam and he is alert and oriented x4.  He has no abdominal or chest tenderness to palpation.  He is satting at 93-94% on room air.  Crackles heard in lower lung bases.  CBC significant for leukocytosis at 25, which is greatly elevated compared to his reading yesterday.  Hemoglobin similar to baseline at 8.7.  He is afebrile here with no use of antipyretics.  CT of head return as negative for acute abnormality.  Chest x-ray showed possible right-sided consolidation concerning for pneumonia.  This appears to be community-acquired.  I suspect that this is the cause of his leukocytosis and most likely the cause of his other symptoms.  He is placed on oxygen and started on Rocephin for community-acquired pneumonia.  Will admit to hospitalist for further management. Appreciate the help of hospitalist for management of this  patient.  Patient discussed with and seen by Dr. Wilson Singer.  Final Clinical Impressions(s) / ED Diagnoses   Final diagnoses:  None    ED Discharge Orders    None     Portions of this note were generated with Dragon dictation software. Dictation errors may occur despite best attempts at proofreading.    Delia Heady, PA-C 02/08/17 1857    Virgel Manifold, MD 02/09/17 [phone]

## 2017-02-08 NOTE — ED Notes (Signed)
Bed: BT24 Expected date:  Expected time:  Means of arrival:  Comments: Hold for triage 3

## 2017-02-08 NOTE — Telephone Encounter (Signed)
Pt called stating "I came yesterday for my injection and I've had a reaction"  Pt c/o dizziness, loss of balance and coordination, trouble walking, and dry heaving.  Pt stated injection was at noon yesterday, and symptoms started around 3pm.  Pt having to use cane to walk which is abnormal for him.  Reviewed with Dr. Irene Limbo, symptoms are not indicative of allergic reaction.  Dr. Irene Limbo is concerned patient may have had stroke or other neurological damage unrelated to injection.  Pt advised to go to emergency room.  Pt asked if ok to wait until tomorrow, advised pt it is in his best interest to go to emergency room as soon as possible per Dr. Irene Limbo.  Pt verbalized understanding "will try to get a ride"

## 2017-02-08 NOTE — H&P (Addendum)
TRH H&P   Patient Demographics:    Alexander Barnett, is a 84 y.o. male  MRN: [id number]   DOB - Jun 09, 1920  Admit Date - 02/08/2017  Outpatient Primary MD for the patient is Alessandra Grout, MD (Inactive)  Referring MD/NP/PA: Angelina Ok  Outpatient Specialists:  Dr. Irene Limbo (oncology)  Patient coming from: home  Chief Complaint  Patient presents with  . Dizziness      HPI:    Alexander Barnett  is a 84 y.o. male, w MDS, h/o prostate cancer/bladder cancer/kidney cancer, Dm2 with CKD stage3, aortic stenosis apparently c/o dizziness, w/o syncope for the past 3days.  Pt has also had cough with yellow sputum for the past 2-3 days as well as slight nausea, and  dyspnea with exertion. Pt denies fever, chills, cp, palp, emesis , abd pain, diarrhea, brbpr, black stool  .  Pt thought that the dizziness might be secondary to injections but pcp ? Oncologist  recommended that he come to ER for evaluation.   In ED,  CT brain IMPRESSION: Mild chronic microvascular changes without acute abnormality.  CXR  IMPRESSION: Bibasilar atelectasis versus consolidation greater on RIGHT new since previous exam.  Enlargement of cardiac silhouette with underlying emphysematous changes.  Aortic atherosclerosis.  Na 136, K 4.2,  Bun 29, Creatinne 1.46 Glucose 120 Wbc 25.1, hgb 8.7, Plt 198  Urinalysis  Wbc 6-30, Rbc 0-5  Pt will be admitted for CAP,  And UTI, and leukocytosis.     Review of systems:    In addition to the HPI above, No Fever-chills, No Headache, No changes with Vision or hearing, No problems swallowing food or Liquids, No Chest pain,  No Abdominal pain, No Nausea or Vommitting, Bowel movements are regular, No Blood in stool or Urine, No dysuria, No new skin rashes or bruises, No new joints pains-aches,  No new weakness, tingling, numbness in any extremity, No  recent weight gain or loss, No polyuria, polydypsia or polyphagia, No significant Mental Stressors.  A full 10 point Review of Systems was done, except as stated above, all other Review of Systems were negative.   With Past History of the following :    Past Medical History:  Diagnosis Date  . Anemia   . Aortic stenosis    moderate by echo 12/2015  . Arthritis    Fingers  . BPH (benign prostatic hyperplasia)   . Bradycardia   . CKD (chronic kidney disease) stage 3, GFR 30-59 ml/min (HCC)   . Diabetes mellitus without complication ([place])    prediabetes diet controlled  . Diastolic dysfunction   . Diverticulosis of colon   . Erectile dysfunction   . Hematuria   . History of kidney cancer   . History of skin cancer   . Hx of transfusion   . Hypertension   . Persistent atrial fibrillation ([place]) 06/2006   refused anticoagulation  .  Prostate cancer ([place])   . Pulmonary HTN ([place]) 12/23/2015   Moderate with PASP 73mmHg  . Transitional cell carcinoma of bladder (HCC)    and renal pelvis      Past Surgical History:  Procedure Laterality Date  . CYSTOSCOPY N/A 06/24/2012   Procedure: CYSTOSCOPY;  Surgeon: Dutch Gray, MD;  Location: [facility];  Service: Urology;  Laterality: N/A;  . CYSTOSCOPY N/A 08/05/2012   Procedure: CYSTOSCOPY;  Surgeon: Dutch Gray, MD;  Location: [facility];  Service: Urology;  Laterality: N/A;  CYSTO, CLOT EVACUATION, FULGERATION OF BLEEDING AREAS AT BLADDER NECK AND PROSTATIC URETHRA    . EYE SURGERY  02/2004   bil.cataract w/ lens implant  . HERNIA REPAIR  05/2002   right  . hyperbaric treatments     40 treatments for prostate-to try to stop bleeding  . imrt radiation  04/2006   for prostate cancer  . MOHS SURGERY  02/2002   nose  . NEPHRECTOMY  03/2007   left  . PROSTATE SURGERY  12/2003  . PROSTATE SURGERY  03/1998   surgery in prostate area  . targus  04/1998   to prostate  . TONSILLECTOMY     as child  . TRANSURETHRAL RESECTION OF BLADDER TUMOR  N/A 03/29/2014   Procedure: CYSTOSCOPY, CLOT EVACUATION, RIGHT RETROGRADE, TRANSURETHRAL RESECTION OF BLADDER TUMOR (TURBT);  Surgeon: Ardis Hughs, MD;  Location: [facility];  Service: Urology;  Laterality: N/A;  . TRANSURETHRAL RESECTION OF PROSTATE N/A 06/24/2012   Procedure: TRANSURETHRAL RESECTION OF THE PROSTATE (TURP);  Surgeon: Dutch Gray, MD;  Location: [facility];  Service: Urology;  Laterality: N/A;  CYSTOSCOPY, TRANSURETHRAL RESECTION OF PROSTATIC URETHRAL TUMOR   . URINARY SURGERY  03/1998   bleeding      Social History:     Social History   Tobacco Use  . Smoking status: Former Smoker    Packs/day: 1.50    Years: 20.00    Pack years: 30.00    Types: Cigarettes    Last attempt to quit: 06/21/1962    Years since quitting: 54.6  . Smokeless tobacco: Never Used  Substance Use Topics  . Alcohol use: Yes    Alcohol/week: 2.4 oz    Types: 4 Glasses of wine per week    Comment: glass wine  occassionally     Lives - at home  Mobility - walks by self  Family History :    History reviewed. No pertinent family history. Pt denies any health issues with his parents   Home Medications:   Prior to Admission medications   Medication Sig Start Date End Date Taking? Authorizing Provider  amLODipine (NORVASC) 5 MG tablet Take 1 tablet (5 mg total) by mouth daily. 12/23/15  Yes Turner, Eber Hong, MD  atenolol (TENORMIN) 25 MG tablet Take 1 tablet (25 mg total) by mouth daily. Patient needs to keep 03/02/17 appointment for further refills 01/30/17  Yes Turner, Eber Hong, MD  cholecalciferol (VITAMIN D) 1000 UNITS tablet Take 1,000 Units by mouth daily.   Yes [provider]  Coenzyme Q10 (CO Q-10) 100 MG CAPS Take 1 capsule by mouth daily.    Yes [provider]  diphenoxylate-atropine (LOMOTIL) 2.5-0.025 MG tablet Take 1 tablet by mouth daily as needed for diarrhea or loose stools. 09/22/16  Yes [provider]  finasteride (PROSCAR) 5 MG tablet Take 5 mg by mouth  every morning.    Yes [provider]  losartan (COZAAR) 100 MG tablet Take 1 tablet (100  mg total) by mouth daily. Please keep upcoming appt with Dr.Turner in December. Thanks 12/11/16  Yes Turner, Eber Hong, MD  Multiple Vitamin (MULTIVITAMIN WITH MINERALS) TABS Take 1 tablet by mouth daily.   Yes [provider]  Multiple Vitamins-Minerals (ICAPS AREDS 2) CAPS Take 1 capsule by mouth daily.   Yes [provider]  travoprost, benzalkonium, (TRAVATAN) 0.004 % ophthalmic solution Place 1 drop into both eyes at bedtime.   Yes [provider]     Allergies:     Allergies  Allergen Reactions  . Amoxicillin Diarrhea    Has patient had a PCN reaction causing immediate rash, facial/tongue/throat swelling, SOB or lightheadedness with hypotension: no Has patient had a PCN reaction causing severe rash involving mucus membranes or skin necrosis: no Has patient had a PCN reaction that required hospitalization: unknown Has patient had a PCN reaction occurring within the last 10 years: no If all of the above answers are "NO", then may proceed with Cephalosporin use.  . Ciprofloxacin Other (See Comments)    Muscle strain in groin     Physical Exam:   Vitals  Blood pressure (!) 143/94, pulse 83, temperature 98.3 F (36.8 C), temperature source Oral, resp. rate 20, height 5\' 6"  (1.676 m), weight 63.5 kg (140 lb), SpO2 95 %.   1. General  lying in bed in NAD,    2. Normal affect and insight, Not Suicidal or Homicidal, Awake Alert, Oriented X 3.  3. No F.N deficits, ALL C.Nerves Intact, Strength 5/5 all 4 extremities, Sensation intact all 4 extremities, Plantars down going.  4. Ears and Eyes appear Normal, Conjunctivae clear, PERRLA. Moist Oral Mucosa.  5. Supple Neck, No JVD, No cervical lymphadenopathy appriciated, No Carotid Bruits.  6. Symmetrical Chest wall movement, Good air movement bilaterally, Slight decrease in bs bilateral base, slight crackles right  lung base.   7. RRR,  s1 s2 2/6 sem rusb  8. Positive Bowel Sounds, Abdomen Soft, No tenderness, No organomegaly appriciated,No rebound -guarding or rigidity.  9.  No Cyanosis, Normal Skin Turgor, No Skin Rash or Bruise.  10. Good muscle tone,  joints appear normal , no effusions, Normal ROM.  11. No Palpable Lymph Nodes in Neck or Axillae     Data Review:    CBC Recent Labs  Lab 02/07/17 1019 02/08/17 1606  WBC 11.0* 25.1*  HGB  --  8.7*  HCT 25.7* 27.3*  PLT 223 198  MCV 82.9 82.0  MCH 26.5* 26.1  MCHC 31.9* 31.9  RDW 21.0* 21.2*  LYMPHSABS 0.9 1.0  MONOABS 1.4* 2.3*  EOSABS 0.1 0.0  BASOSABS 0.1 0.0   ------------------------------------------------------------------------------------------------------------------  Chemistries  Recent Labs  Lab 02/08/17 1606  NA 136  K 4.2  CL 104  CO2 24  GLUCOSE 120*  BUN 29*  CREATININE 1.46*  CALCIUM 9.2   ------------------------------------------------------------------------------------------------------------------ estimated creatinine clearance is 27.8 mL/min (A) (by C-G formula based on SCr of 1.46 mg/dL (H)). ------------------------------------------------------------------------------------------------------------------ No results for input(s): TSH, T4TOTAL, T3FREE, THYROIDAB in the last 72 hours.  Invalid input(s): FREET3  Coagulation profile No results for input(s): INR, PROTIME in the last 168 hours. ------------------------------------------------------------------------------------------------------------------- No results for input(s): DDIMER in the last 72 hours. -------------------------------------------------------------------------------------------------------------------  Cardiac Enzymes No results for input(s): CKMB, TROPONINI, MYOGLOBIN in the last 168 hours.  Invalid input(s):  CK ------------------------------------------------------------------------------------------------------------------ No results found for: BNP   ---------------------------------------------------------------------------------------------------------------  Urinalysis    Component Value Date/Time   COLORURINE YELLOW 02/08/2017 1636   APPEARANCEUR HAZY (A) 02/08/2017  1636   LABSPEC 1.017 02/08/2017 1636   PHURINE 8.0 02/08/2017 1636   GLUCOSEU NEGATIVE 02/08/2017 1636   HGBUR NEGATIVE 02/08/2017 1636   BILIRUBINUR NEGATIVE 02/08/2017 1636   KETONESUR NEGATIVE 02/08/2017 1636   PROTEINUR 100 (A) 02/08/2017 1636   UROBILINOGEN 0.2 07/14/2012 1127   NITRITE NEGATIVE 02/08/2017 1636   LEUKOCYTESUR TRACE (A) 02/08/2017 1636    ----------------------------------------------------------------------------------------------------------------   Imaging Results:    Dg Chest 2 View  Result Date: 02/08/2017 CLINICAL DATA:  Anemia, low iron, dizziness and nausea beginning at 1500 hours, cough, congestion and crackles, history pulmonary hypertension, diabetes mellitus, stage III chronic kidney disease, diastolic dysfunction, transitional cell carcinoma of the bladder, prostate cancer, former smoker EXAM: CHEST  2 VIEW COMPARISON:  06/20/2012 FINDINGS: Enlargement of cardiac silhouette. Normal mediastinal contours and pulmonary vascularity. Atherosclerotic calcification aorta. Emphysematous and bronchitic changes with calcified granuloma in lower RIGHT chest. Atelectasis versus consolidation in BILATERAL lower lobes greater on RIGHT. Remaining lungs clear. No acute infiltrate, pleural effusion or pneumothorax. Bones demineralized. IMPRESSION: Bibasilar atelectasis versus consolidation greater on RIGHT new since previous exam. Enlargement of cardiac silhouette with underlying emphysematous changes. Aortic atherosclerosis. Electronically Signed   By: Lavonia Dana M.D.   On: 02/08/2017 17:17   Ct Head Wo  Contrast  Result Date: 02/08/2017 CLINICAL DATA:  Dizziness and nausea EXAM: CT HEAD WITHOUT CONTRAST TECHNIQUE: Contiguous axial images were obtained from the base of the skull through the vertex without intravenous contrast. COMPARISON:  None. FINDINGS: Brain: No mass lesion, intraparenchymal hemorrhage or extra-axial collection. No evidence of acute cortical infarct. There is periventricular hypoattenuation compatible with chronic microvascular disease. Vascular: No hyperdense vessel or unexpected calcification. Skull: Normal visualized skull base, calvarium and extracranial soft tissues. Sinuses/Orbits: No sinus fluid levels or advanced mucosal thickening. No mastoid effusion. Normal orbits. IMPRESSION: Mild chronic microvascular changes without acute abnormality. Electronically Signed   By: Ulyses Jarred M.D.   On: 02/08/2017 17:08      Assessment & Plan:    Principal Problem:   CAP (community acquired pneumonia) Active Problems:   Anemia   CKD (chronic kidney disease) stage 3, GFR 30-59 ml/min (HCC)    CAP Blood culture x2 Sputum gram stain culture Urine strep antigen, urine legionella antigen Rocephin 1gm iv qday, zithromax 500mg  iv qday  UTI Await urine culture Rocephin 1gm iv qday  Leukocytosis Secondary to above  Check cbc in am  Dizziness secondary to above infection Check cardiac echo to f/o worsening Aortic stenosis Check carotid u/s r/o carotid stenosis  CKD stage 3 Ns iv Check cmp in am  MDS Will need oncology f/u as outpatient       DVT Prophylaxis Lovenox - SCDs   AM Labs Ordered, also please review Full Orders  Family Communication: Admission, patients condition and plan of care including tests being ordered have been discussed with the patient  who indicate understanding and agree with the plan and Code Status.  Code Status FULL CODE  Likely DC to  home  Condition GUARDED    Consults called: none  Admission status: inpatient   Time spent  in minutes : 45   Jani Gravel M.D on 02/08/2017 at 8:24 PM  Between 7am to 7pm - Pager - [phone]  . After 7pm go to [email or web address] - password [facility]  [facility] - Office  [phone]

## 2017-02-08 NOTE — ED Notes (Signed)
2x unsuccessful IV attemp, bruising noted to left forearm

## 2017-02-08 NOTE — ED Notes (Signed)
ED TO INPATIENT HANDOFF REPORT  Name/Age/Gender Alexander Barnett 84 y.o. male  Code Status Code Status History    Date Active Date Inactive Code Status Order ID Comments User Context   03/26/2014 14:39 04/01/2014 15:08 Full Code [id number]  Franchot Gallo, MD Inpatient    Advance Directive Documentation     Most Recent Value  Type of Advance Directive  Healthcare Power of [place], Living will [pt states he has a dnr]  Pre-existing out of facility DNR order (yellow form or pink MOST form)  No data  "MOST" Form in Place?  No data      Home/SNF/Other Skilled nursing facility  Chief Complaint Cant Walk  Level of Care/Admitting Diagnosis ED Disposition    ED Disposition Condition [facility] Area: [place] [[id number]]  Level of Care: Telemetry [5]  Admit to tele based on following criteria: Monitor for Ischemic changes  Diagnosis: CAP (community acquired pneumonia) [063016]  Admitting Physician: Jani Gravel [[id number]]  Attending Physician: Jani Gravel [phone]  Estimated length of stay: past midnight tomorrow  Certification:: I certify this patient will need inpatient services for at least 2 midnights  PT Class (Do Not Modify): Inpatient [101]  PT Acc Code (Do Not Modify): Private [1]       Medical History Past Medical History:  Diagnosis Date  . Anemia   . Aortic stenosis    moderate by echo 12/2015  . Arthritis    Fingers  . BPH (benign prostatic hyperplasia)   . Bradycardia   . CKD (chronic kidney disease) stage 3, GFR 30-59 ml/min (HCC)   . Diabetes mellitus without complication ([place])    prediabetes diet controlled  . Diastolic dysfunction   . Diverticulosis of colon   . Erectile dysfunction   . Hematuria   . History of kidney cancer   . History of skin cancer   . Hx of transfusion   . Hypertension   . Persistent atrial fibrillation ([place]) 06/2006   refused anticoagulation  . Prostate cancer ([place])   . Pulmonary HTN ([place])  12/23/2015   Moderate with PASP 35mHg  . 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 Amoxicillin Diarrhea    Has patient had a PCN reaction causing immediate rash, facial/tongue/throat swelling, SOB or lightheadedness with hypotension: no Has patient had a PCN reaction causing severe rash involving mucus membranes or skin necrosis: no Has patient had a PCN reaction that required hospitalization: unknown Has patient had a PCN reaction occurring within the last 10 years: no If all of the above answers are "NO", then may proceed with Cephalosporin use.  . Ciprofloxacin Other (See Comments)    Muscle strain in groin    IV Location/Drains/Wounds Patient Lines/Drains/Airways Status   Active Line/Drains/Airways    Name:   Placement date:   Placement time:   Site:   Days:   Peripheral IV 02/08/17 Right Forearm   02/08/17    1826    Forearm   less than 1   Urethral Catheter Jeneen, RN Straight-tip;Double-lumen 16 Fr.   11/18/16    0155    Straight-tip;Double-lumen   882         Labs/Imaging Results for orders placed or performed during the hospital encounter of 02/08/17 (from the past 48 hour(s))  Basic metabolic panel     Status: Abnormal   Collection Time: 02/08/17  4:06 PM  Result Value  Ref Range   Sodium 136 135 - 145 mmol/L   Potassium 4.2 3.5 - 5.1 mmol/L   Chloride 104 101 - 111 mmol/L   CO2 24 22 - 32 mmol/L   Glucose, Bld 120 (H) 65 - 99 mg/dL   BUN 29 (H) 6 - 20 mg/dL   Creatinine, Ser 1.46 (H) 0.61 - 1.24 mg/dL   Calcium 9.2 8.9 - 10.3 mg/dL   GFR calc non Af Amer 39 (L) >60 mL/min   GFR calc Af Amer 46 (L) >60 mL/min    Comment: (NOTE) The eGFR has been calculated using the CKD EPI equation. This calculation has not been validated in all clinical situations. eGFR's persistently <60 mL/min signify possible Chronic Kidney Disease.    Anion gap 8 5 - 15  CBC with Differential     Status: Abnormal    Collection Time: 02/08/17  4:06 PM  Result Value Ref Range   WBC 25.1 (H) 4.0 - 10.5 K/uL   RBC 3.33 (L) 4.22 - 5.81 MIL/uL   Hemoglobin 8.7 (L) 13.0 - 17.0 g/dL   HCT 27.3 (L) 39.0 - 52.0 %   MCV 82.0 78.0 - 100.0 fL   MCH 26.1 26.0 - 34.0 pg   MCHC 31.9 30.0 - 36.0 g/dL   RDW 21.2 (H) 11.5 - 15.5 %   Platelets 198 150 - 400 K/uL   Neutrophils Relative % 87 %   Lymphocytes Relative 4 %   Monocytes Relative 9 %   Eosinophils Relative 0 %   Basophils Relative 0 %   Neutro Abs 21.8 (H) 1.7 - 7.7 K/uL   Lymphs Abs 1.0 0.7 - 4.0 K/uL   Monocytes Absolute 2.3 (H) 0.1 - 1.0 K/uL   Eosinophils Absolute 0.0 0.0 - 0.7 K/uL   Basophils Absolute 0.0 0.0 - 0.1 K/uL   RBC Morphology TARGET CELLS     Comment: ELLIPTOCYTES   WBC Morphology WHITE COUNT CONFIRMED ON SMEAR     Comment: TOXIC GRANULATION   Smear Review PLATELET COUNT CONFIRMED BY SMEAR   Urinalysis, Routine w reflex microscopic     Status: Abnormal   Collection Time: 02/08/17  4:36 PM  Result Value Ref Range   Color, Urine YELLOW YELLOW   APPearance HAZY (A) CLEAR   Specific Gravity, Urine 1.017 1.005 - 1.030   pH 8.0 5.0 - 8.0   Glucose, UA NEGATIVE NEGATIVE mg/dL   Hgb urine dipstick NEGATIVE NEGATIVE   Bilirubin Urine NEGATIVE NEGATIVE   Ketones, ur NEGATIVE NEGATIVE mg/dL   Protein, ur 100 (A) NEGATIVE mg/dL   Nitrite NEGATIVE NEGATIVE   Leukocytes, UA TRACE (A) NEGATIVE   RBC / HPF 0-5 0 - 5 RBC/hpf   WBC, UA 6-30 0 - 5 WBC/hpf   Bacteria, UA RARE (A) NONE SEEN   Squamous Epithelial / LPF 0-5 (A) NONE SEEN   Mucus PRESENT    Amorphous Crystal PRESENT    Triple Phosphate Crystal PRESENT    Dg Chest 2 View  Result Date: 02/08/2017 CLINICAL DATA:  Anemia, low iron, dizziness and nausea beginning at 1500 hours, cough, congestion and crackles, history pulmonary hypertension, diabetes mellitus, stage III chronic kidney disease, diastolic dysfunction, transitional cell carcinoma of the bladder, prostate cancer, former  smoker EXAM: CHEST  2 VIEW COMPARISON:  06/20/2012 FINDINGS: Enlargement of cardiac silhouette. Normal mediastinal contours and pulmonary vascularity. Atherosclerotic calcification aorta. Emphysematous and bronchitic changes with calcified granuloma in lower RIGHT chest. Atelectasis versus consolidation in BILATERAL lower lobes greater on RIGHT.  Remaining lungs clear. No acute infiltrate, pleural effusion or pneumothorax. Bones demineralized. IMPRESSION: Bibasilar atelectasis versus consolidation greater on RIGHT new since previous exam. Enlargement of cardiac silhouette with underlying emphysematous changes. Aortic atherosclerosis. Electronically Signed   By: Lavonia Dana M.D.   On: 02/08/2017 17:17   Ct Head Wo Contrast  Result Date: 02/08/2017 CLINICAL DATA:  Dizziness and nausea EXAM: CT HEAD WITHOUT CONTRAST TECHNIQUE: Contiguous axial images were obtained from the base of the skull through the vertex without intravenous contrast. COMPARISON:  None. FINDINGS: Brain: No mass lesion, intraparenchymal hemorrhage or extra-axial collection. No evidence of acute cortical infarct. There is periventricular hypoattenuation compatible with chronic microvascular disease. Vascular: No hyperdense vessel or unexpected calcification. Skull: Normal visualized skull base, calvarium and extracranial soft tissues. Sinuses/Orbits: No sinus fluid levels or advanced mucosal thickening. No mastoid effusion. Normal orbits. IMPRESSION: Mild chronic microvascular changes without acute abnormality. Electronically Signed   By: Ulyses Jarred M.D.   On: 02/08/2017 17:08    Pending Labs [employer] (From admission, onward)   Start     Ordered   Signed and Held  HIV antibody  Once,   R     Signed and Held   Signed and Held  Culture, blood (routine x 2) Call MD if unable to obtain prior to antibiotics being given  BLOOD CULTURE X 2,   R    Comments:  If blood cultures drawn in Emergency Department - Do not draw and cancel  order   Question:  Patient immune status  Answer:  Normal   Signed and Held   Signed and Held  Culture, sputum-assessment  Once,   R    Question:  Patient immune status  Answer:  Normal   Signed and Held   Signed and Held  Gram stain  Once,   R    Question:  Patient immune status  Answer:  Normal   Signed and Held   Signed and Held  Strep pneumoniae urinary antigen  Once,   R     Signed and Held   Signed and Held  Creatinine, serum  (enoxaparin (LOVENOX)    CrCl >/= 30 ml/min)  Weekly,   R    Comments:  while on enoxaparin therapy    Signed and Held   Signed and Held  Legionella Pneumophila Serogp 1 Ur Ag  Once,   R     Signed and Held   Signed and Held  Respiratory Panel by PCR  (Respiratory virus panel)  Once,   R    Question:  Patient immune status  Answer:  Normal   Signed and Held      Vitals/Pain Today's Vitals   02/08/17 1352 02/08/17 1602 02/08/17 1746 02/08/17 2005  BP:  (!) 151/82 (!) 151/82 (!) 143/94  Pulse:  (!) 101 96 83  Resp:  _0 Temp:      TempSrc:      SpO2: 93% 93% 91% 95%  Weight: 140 lb (63.5 kg)     Height: _1  (1.676 m)     PainSc:        Isolation Precautions No active isolations  Medications Medications  sodium chloride 0.9 % bolus 1,000 mL (0 mLs Intravenous Stopped 02/08/17 2014)  cefTRIAXone (ROCEPHIN) 1 g in dextrose 5 % 50 mL IVPB (0 g Intravenous Stopped 02/08/17 2013)    Mobility walks with device

## 2017-02-09 ENCOUNTER — Telehealth: Payer: Self-pay | Admitting: Cardiology

## 2017-02-09 ENCOUNTER — Inpatient Hospital Stay (HOSPITAL_COMMUNITY): Payer: Medicare Other

## 2017-02-09 DIAGNOSIS — I350 Nonrheumatic aortic (valve) stenosis: Secondary | ICD-10-CM

## 2017-02-09 DIAGNOSIS — N183 Chronic kidney disease, stage 3 (moderate): Secondary | ICD-10-CM

## 2017-02-09 DIAGNOSIS — J9601 Acute respiratory failure with hypoxia: Secondary | ICD-10-CM

## 2017-02-09 DIAGNOSIS — E1122 Type 2 diabetes mellitus with diabetic chronic kidney disease: Secondary | ICD-10-CM

## 2017-02-09 DIAGNOSIS — J121 Respiratory syncytial virus pneumonia: Secondary | ICD-10-CM

## 2017-02-09 DIAGNOSIS — I272 Pulmonary hypertension, unspecified: Secondary | ICD-10-CM

## 2017-02-09 DIAGNOSIS — R55 Syncope and collapse: Secondary | ICD-10-CM

## 2017-02-09 DIAGNOSIS — E119 Type 2 diabetes mellitus without complications: Secondary | ICD-10-CM

## 2017-02-09 DIAGNOSIS — E440 Moderate protein-calorie malnutrition: Secondary | ICD-10-CM

## 2017-02-09 DIAGNOSIS — J9600 Acute respiratory failure, unspecified whether with hypoxia or hypercapnia: Secondary | ICD-10-CM

## 2017-02-09 DIAGNOSIS — I359 Nonrheumatic aortic valve disorder, unspecified: Secondary | ICD-10-CM

## 2017-02-09 DIAGNOSIS — I700 Atherosclerosis of aorta: Secondary | ICD-10-CM

## 2017-02-09 LAB — RESPIRATORY PANEL BY PCR
Adenovirus: NOT DETECTED
Bordetella pertussis: NOT DETECTED
CORONAVIRUS OC43-RVPPCR: NOT DETECTED
Chlamydophila pneumoniae: NOT DETECTED
Coronavirus 229E: NOT DETECTED
Coronavirus HKU1: NOT DETECTED
Coronavirus NL63: NOT DETECTED
INFLUENZA A-RVPPCR: NOT DETECTED
INFLUENZA B-RVPPCR: NOT DETECTED
METAPNEUMOVIRUS-RVPPCR: NOT DETECTED
MYCOPLASMA PNEUMONIAE-RVPPCR: NOT DETECTED
PARAINFLUENZA VIRUS 1-RVPPCR: NOT DETECTED
PARAINFLUENZA VIRUS 4-RVPPCR: NOT DETECTED
Parainfluenza Virus 2: NOT DETECTED
Parainfluenza Virus 3: NOT DETECTED
RESPIRATORY SYNCYTIAL VIRUS-RVPPCR: DETECTED — AB
RHINOVIRUS / ENTEROVIRUS - RVPPCR: NOT DETECTED

## 2017-02-09 LAB — GLUCOSE, CAPILLARY
GLUCOSE-CAPILLARY: 132 mg/dL — AB (ref 65–99)
GLUCOSE-CAPILLARY: 109 mg/dL — AB (ref 65–99)
GLUCOSE-CAPILLARY: 103 mg/dL — AB (ref 65–99)
Glucose-Capillary: 92 mg/dL (ref 65–99)

## 2017-02-09 LAB — HIV ANTIBODY (ROUTINE TESTING W REFLEX): HIV Screen 4th Generation wRfx: NONREACTIVE

## 2017-02-09 LAB — ECHOCARDIOGRAM COMPLETE
HEIGHTINCHES: 66 in
Weight: 2264.57 oz

## 2017-02-09 LAB — STREP PNEUMONIAE URINARY ANTIGEN: Strep Pneumo Urinary Antigen: NEGATIVE

## 2017-02-09 LAB — EXPECTORATED SPUTUM ASSESSMENT W GRAM STAIN, RFLX TO RESP C: Special Requests: NORMAL

## 2017-02-09 LAB — EXPECTORATED SPUTUM ASSESSMENT W REFEX TO RESP CULTURE

## 2017-02-09 MED ORDER — AZITHROMYCIN 250 MG PO TABS
500.0000 mg | ORAL_TABLET | Freq: Every day | ORAL | Status: DC
  Administered 2017-02-09 – 2017-02-10 (×2): 500 mg via ORAL
  Administered 2017-02-11: 250 mg via ORAL
  Filled 2017-02-09 (×3): qty 2

## 2017-02-09 MED ORDER — ALBUTEROL SULFATE (2.5 MG/3ML) 0.083% IN NEBU
2.5000 mg | INHALATION_SOLUTION | RESPIRATORY_TRACT | Status: DC | PRN
  Administered 2017-02-11: 2.5 mg via RESPIRATORY_TRACT
  Filled 2017-02-09: qty 3

## 2017-02-09 MED ORDER — GLUCERNA SHAKE PO LIQD
237.0000 mL | Freq: Three times a day (TID) | ORAL | Status: DC
  Administered 2017-02-09 – 2017-02-12 (×8): 237 mL via ORAL
  Filled 2017-02-09 (×11): qty 237

## 2017-02-09 NOTE — Plan of Care (Signed)
  Elimination: Will not experience complications related to bowel motility 02/09/2017 1854 - Progressing by Dorene Sorrow, RN   Pain Managment: General experience of comfort will improve 02/09/2017 1854 - Progressing by Dorene Sorrow, RN   Nutrition: Adequate nutrition will be maintained 02/09/2017 1854 - Progressing by Dorene Sorrow, RN

## 2017-02-09 NOTE — Progress Notes (Signed)
  Echocardiogram 2D Echocardiogram has been performed.  Alexander Barnett L Androw 02/09/2017, 10:11 AM

## 2017-02-09 NOTE — Telephone Encounter (Signed)
Pt's pharmacy requesting a refill on Amlodipine 5 mg tablet. Dr. Radford Pax has never prescribed this medication. Would you like to refill this medication? Pt has an appt with Dr. Radford Pax in February. Please advise

## 2017-02-09 NOTE — Telephone Encounter (Signed)
Returned call to [facility]. Per chart order for amlodipine 5mg  was order by Dr. Maudie Mercury today. Pharmacist at [facility] stated they have not received the order. Informed them I will follow up with patient.   Called left message with patient to follow up with Dr. Julianne Rice office regarding refill on amlodipine.

## 2017-02-09 NOTE — Progress Notes (Signed)
PHARMACIST - PHYSICIAN COMMUNICATION DR:   Sarajane Jews CONCERNING: Antibiotic IV to Oral Route Change Policy  RECOMMENDATION: This patient is receiving azithromycin by the intravenous route.  Based on criteria approved by the Pharmacy and Therapeutics Committee, the antibiotic(s) is/are being converted to the equivalent oral dose form(s).   DESCRIPTION: These criteria include:  Patient being treated for a respiratory tract infection, urinary tract infection, cellulitis or clostridium difficile associated diarrhea if on metronidazole  The patient is not neutropenic and does not exhibit a GI malabsorption state  The patient is eating (either orally or via tube) and/or has been taking other orally administered medications for a least 24 hours  The patient is improving clinically and has a Tmax < 100.5  If you have questions about this conversion, please contact the Pharmacy Department  []   [phone] )  [facility] []   [phone] )  [facility] []   [phone] )  [facility] []   [phone] )  [facility] [x]   [phone] )  [place], [place].D. [phone] 02/09/2017 8:07 AM

## 2017-02-09 NOTE — Progress Notes (Signed)
Carotid artery duplex has been completed. 1-39% ICA stenosis bilaterally.  02/09/17 9:27 AM Alexander Barnett RVT

## 2017-02-09 NOTE — Progress Notes (Signed)
CRITICAL VALUE ALERT  Critical Value:  RSV  Date & Time Notied:  5:59 AM 02/09/17   Provider Notified: [place] notified  Orders Received/Actions taken: On call notified

## 2017-02-09 NOTE — Progress Notes (Signed)
PROGRESS NOTE  Johathan Province [id number] DOB: August 05, 1920 DOA: 02/08/2017 PCP: Alessandra Grout, MD (Inactive)  Brief Narrative: 89yom presented with dizziness for several days, was concerned about reaction to Epogen. Hematologist referred patient to ED. Other complaints included productive cough. Admitted for pneumonia, possible UTI, dizziness  Assessment/Plan Acute hypoxic resp failure secondary to pneumonia, probably RSV - will wean oxygen as tolerated and d/c abx 1/26 if culture data negative  - check CBC in AM - albuterol PRN  Dizziness. CT head negative. - may be secondary to acute illness - f/u echo given h/o aortic stenosis - f/u carotid u/s  Chronic normocytic anemia followed by Dr. Irene Limbo. - Hgb stable, f/u as outpatient  DM type 2 - CBG stable. Continue SSI.  Aortic stenosis  - f/u echo  CKD stage III - creatinine stable  Moderate malnutrition  - per dietician  Aortic atherosclerosis  - would not treat   Improving, likely home 1/26 if better  DVT prophylaxis: enoxaparin  Code Status: full Family Communication: none Disposition Plan: home    Murray Hodgkins, MD  [facility] Direct contact: [phone] --[place] [place]  --[email or web address]; password [email or web address]  7PM-7AM contact night coverage as above 02/09/2017, 2:49 PM  LOS: 1 day   Consultants:    Procedures:    Antimicrobials:  Ceftriaxone 1/24 >>  Azithromycin 1/24 >>  Interval history/Subjective: Feels better, breathing better, dizziness has resolved, not jittery anymore.  Objective: Vitals:  Vitals:   02/08/17 2039 02/09/17 0446  BP: 135/81 (!) 117/59  Pulse: 100 81  Resp: 20 18  Temp: (!) 100.5 F (38.1 C) 99.5 F (37.5 C)  SpO2: 90% 91%    Exam:  Constitutional:  . Appears calm and comfortable Eyes:  . pupils and irises appear normal . Normal lids  ENMT:  . Hard of hearing  . Lips appear normal Respiratory:  . CTA bilaterally, no w/r/r.  . Respiratory  effort normal.  Cardiovascular:  . RRR, no m/r/g . No LE extremity edema   Psychiatric:  . Mental status o Mood, affect appropriate  I have personally reviewed the following:   Labs:  No labs today  On admission  WBC 25.1  Hgb stable 8.7  Imaging studies:  CXR and CT head noted  Medical tests:  EKG SR, PVCs, no acute changes  Scheduled Meds: . amLODipine  5 mg Oral Daily  . atenolol  25 mg Oral Daily  . azithromycin  500 mg Oral QHS  . cholecalciferol  1,000 Units Oral Daily  . enoxaparin (LOVENOX) injection  30 mg Subcutaneous Q24H  . feeding supplement (GLUCERNA SHAKE)  237 mL Oral TID BM  . finasteride  5 mg Oral q morning - 10a  . insulin aspart  0-5 Units Subcutaneous QHS  . insulin aspart  0-9 Units Subcutaneous TID WC  . latanoprost  1 drop Both Eyes QHS  . losartan  100 mg Oral Daily  . mouth rinse  15 mL Mouth Rinse BID  . multivitamin with minerals  1 tablet Oral Daily   Continuous Infusions: . cefTRIAXone (ROCEPHIN)  IV      Principal Problem:   CAP (community acquired pneumonia) Active Problems:   Anemia   CKD (chronic kidney disease) stage 3, GFR 30-59 ml/min (HCC)   Aortic stenosis   Malnutrition of moderate degree   RSV (respiratory syncytial virus pneumonia)   Acute hypoxemic respiratory failure (HCC)   DM type 2 (diabetes mellitus, type 2) ([place])   Aortic atherosclerosis ([place])  LOS: 1 day          

## 2017-02-09 NOTE — Progress Notes (Signed)
Initial Nutrition Assessment  DOCUMENTATION CODES:   Non-severe (moderate) malnutrition in context of chronic illness  INTERVENTION:   - Glucerna Shake po TID, each supplement provides 220 kcal and 10 grams of protein - Encourage PO intake - Liberalize diet to regular diet  NUTRITION DIAGNOSIS:   Moderate Malnutrition related to chronic illness(CKD stage 3, hx of several cancers) as evidenced by moderate fat depletion, moderate muscle depletion.  GOAL:   Patient will meet greater than or equal to 90% of their needs  MONITOR:   PO intake, Supplement acceptance, Weight trends, Labs  REASON FOR ASSESSMENT:   Malnutrition Screening Tool   ASSESSMENT:   84 year old male with PMH of CKD stage 3, DM 2, HTN, prostate cancer, bladder cancer, kidney cancer, anemia, and s/p nephrectomy who presented to ED for evaluation of dizziness and nausea. Pt followed by hematologist for low iron and hemoglobin and received EPO injection on 02/07/17. Symptoms began 3 hours following injection. Pt admitted for CAP, UTI, and leukocytosis.  Spoke with pt at beside who reports poor appetite for the past several months. States he lives alone and cooks for himself at home. Pt states that his diet is "up and down." Pt states that breakfast may include a couple of donuts or instant grits. Lunch may include a BLT or egg drop soup and a spring roll from a [employer]. Dinner may include a home-cooked steak with a baked potato and asparagus or eating out at [employer], or [employer]. Pt states he ate eggs and coffee with cream for breakfast this AM and that he ordered fruit and cottage cheese for lunch.  Spoke with attending physician who approved liberalizing pt's diet from Heart Healthy/Carb Modified to Regular.  Pt reports his UBW as 140-142 lbs and states that he has had trouble "keeping my weight." Per weight history in chart, pt has lost 6 lbs in a little less than 3 months (4.1% weight  loss, not significant for timeframe).  Pt agreeable to trying Glucerna oral nutrition supplement.   Medications reviewed and include: 1000 units vitamin D daily, Novolog, MVI with minerals  Labs reviewed: glucose 120 (H), BUN 29 (H), creatinine 1.46 (H) CBG's: 92 this AM  NUTRITION - FOCUSED PHYSICAL EXAM:    Most Recent Value  Orbital Region  Mild depletion  Upper Arm Region  Moderate depletion  Thoracic and Lumbar Region  Moderate depletion  Buccal Region  Mild depletion  Temple Region  No depletion  Clavicle Bone Region  Moderate depletion  Clavicle and Acromion Bone Region  Moderate depletion  Scapular Bone Region  Moderate depletion  Dorsal Hand  Mild depletion  Patellar Region  Moderate depletion  Anterior Thigh Region  Mild depletion  Posterior Calf Region  Moderate depletion  Edema (RD Assessment)  Mild  Hair  Reviewed  Eyes  Reviewed  Mouth  Reviewed  Skin  Reviewed  Nails  Reviewed       Diet Order:  Diet regular Room service appropriate? Yes; Fluid consistency: Thin  EDUCATION NEEDS:   No education needs have been identified at this time  Skin:  Skin Assessment: Reviewed RN Assessment  Last BM:  PTA  Height:   Ht Readings from Last 1 Encounters:  02/08/17 5\' 6"  (1.676 m)    Weight:   Wt Readings from Last 1 Encounters:  02/08/17 141 lb 8.6 oz (64.2 kg)    Ideal Body Weight:  64.5 kg  BMI:  Body mass index is 22.84 kg/m.  Estimated  Nutritional Needs:   Kcal:  1600-1800 kcal/day  Protein:  80-90 grams/day  Fluid:  1.6-1.8 L/day    Gaynell Face, MS, RD, LDN Pager: [phone] Weekend/After Hours: [phone]

## 2017-02-10 DIAGNOSIS — J121 Respiratory syncytial virus pneumonia: Principal | ICD-10-CM

## 2017-02-10 LAB — CBC
HEMATOCRIT: 24.3 % — AB (ref 39.0–52.0)
Hemoglobin: 8 g/dL — ABNORMAL LOW (ref 13.0–17.0)
MCH: 26.8 pg (ref 26.0–34.0)
MCHC: 32.9 g/dL (ref 30.0–36.0)
MCV: 81.5 fL (ref 78.0–100.0)
Platelets: 161 10*3/uL (ref 150–400)
RBC: 2.98 MIL/uL — ABNORMAL LOW (ref 4.22–5.81)
RDW: 21.9 % — AB (ref 11.5–15.5)
WBC: 13 10*3/uL — AB (ref 4.0–10.5)

## 2017-02-10 LAB — GLUCOSE, CAPILLARY
GLUCOSE-CAPILLARY: 110 mg/dL — AB (ref 65–99)
Glucose-Capillary: 99 mg/dL (ref 65–99)
Glucose-Capillary: 128 mg/dL — ABNORMAL HIGH (ref 65–99)
Glucose-Capillary: 115 mg/dL — ABNORMAL HIGH (ref 65–99)

## 2017-02-10 MED ORDER — GUAIFENESIN-DM 100-10 MG/5ML PO SYRP
5.0000 mL | ORAL_SOLUTION | ORAL | Status: DC | PRN
  Administered 2017-02-10 – 2017-02-11 (×7): 5 mL via ORAL
  Filled 2017-02-10 (×7): qty 10

## 2017-02-10 MED ORDER — MAGNESIUM HYDROXIDE 400 MG/5ML PO SUSP
15.0000 mL | Freq: Every day | ORAL | Status: DC | PRN
  Administered 2017-02-10: 15 mL via ORAL
  Filled 2017-02-10: qty 30

## 2017-02-10 NOTE — Progress Notes (Signed)
PROGRESS NOTE  Alexander Barnett [id number] DOB: 05-05-1920 DOA: 02/08/2017 PCP: Alessandra Grout, MD (Inactive)  Brief Narrative: 89yom presented with dizziness for several days, was concerned about reaction to Epogen. Hematologist referred patient to ED. Other complaints included productive cough. Admitted for pneumonia, possible UTI, dizziness  Assessment/Plan Acute hypoxic resp failure secondary to pneumonia, possibly RSV - appears worse today, more SOB, still hypoxic - WBC rapidly trending down, remainder of CBC stable - given worsening, will continue abx for now, cannot rule out superimposed bacterial infection  Dizziness. CT head negative. - presume secondary to acute illness - echo showed normal LVEF, grade 2 diastolic dysfunction, slight worsening of aortic stenosis - carotid u/s was unremarkable.   Chronic normocytic anemia followed by Dr. Irene Limbo. - Hgb remains stable, f/u as outpatient  DM type 2 - CBG remains stable. Continue SSI 1/26  Aortic stenosis  - echo showed slight worsening of aortic stenosis, now moderate to severe  CKD stage III - creatinine stable  Moderate malnutrition  - per dietician  Aortic atherosclerosis  - would not treat   DVT prophylaxis: enoxaparin  Code Status: full Family Communication: none Disposition Plan: home    Murray Hodgkins, MD  [facility] Direct contact: [phone] --Via amion app [place]  --[email or web address]; password [email or web address]  7PM-7AM contact night coverage as above 02/10/2017, 2:01 PM  LOS: 2 days   Consultants:    Procedures:  Echo Study Conclusions  - Left ventricle: The cavity size was normal. There was mild   concentric hypertrophy. Systolic function was normal. The   estimated ejection fraction was in the range of 60% to 65%. Wall   motion was normal; there were no regional wall motion   abnormalities. Features are consistent with a pseudonormal left   ventricular filling pattern, with concomitant  abnormal relaxation   and increased filling pressure (grade 2 diastolic dysfunction). - Aortic valve: Right coronary cusp mobility was severely   restricted. There was moderate to severe stenosis. There was   trivial regurgitation. Valve area (VTI): 0.94 cm^2. - Left atrium: The atrium was moderately dilated. - Tricuspid valve: There was mild-moderate regurgitation directed   centrally. - Pulmonary arteries: Systolic pressure was moderately increased.   PA peak pressure: 51 mm Hg (S).  Impressions:  - Aortic stenosis has worsened slightly since the previous study.  Antimicrobials:  Ceftriaxone 1/24 >>  Azithromycin 1/24 >>  Interval history/Subjective: Feels a little worse today, more SOB. Still coughing. Hypoxic on RA.  Objective: Vitals:  Vitals:   02/10/17 0953 02/10/17 1010  BP: 127/77   Pulse: 95   Resp:    Temp:    SpO2: (!) 84% 90%    Exam:  Constitutional:   . Appears calm but somewhat uncomfortable Respiratory:  . CTA bilaterally, no w/r/r.  . Respiratory effort moderately increased. Cardiovascular:  . RRR, no m/r/g . No LE extremity edema   Psychiatric:  . Mental status o Mood, affect appropriate  I have personally reviewed the following:   Labs:  CBG stable  WBC 25.1 >> 13.0  Hgb stable 8.0   Scheduled Meds: . amLODipine  5 mg Oral Daily  . atenolol  25 mg Oral Daily  . azithromycin  500 mg Oral QHS  . cholecalciferol  1,000 Units Oral Daily  . enoxaparin (LOVENOX) injection  30 mg Subcutaneous Q24H  . feeding supplement (GLUCERNA SHAKE)  237 mL Oral TID BM  . finasteride  5 mg Oral q morning - 10a  . insulin aspart  0-5  Units Subcutaneous QHS  . insulin aspart  0-9 Units Subcutaneous TID WC  . latanoprost  1 drop Both Eyes QHS  . losartan  100 mg Oral Daily  . mouth rinse  15 mL Mouth Rinse BID  . multivitamin with minerals  1 tablet Oral Daily   Continuous Infusions: . cefTRIAXone (ROCEPHIN)  IV Stopped (02/09/17 2320)     Principal Problem:   CAP (community acquired pneumonia) Active Problems:   Anemia   CKD (chronic kidney disease) stage 3, GFR 30-59 ml/min (HCC)   Aortic stenosis   Malnutrition of moderate degree   RSV (respiratory syncytial virus pneumonia)   Acute hypoxemic respiratory failure (HCC)   DM type 2 (diabetes mellitus, type 2) ([place])   Aortic atherosclerosis ([place])   LOS: 2 days

## 2017-02-10 NOTE — Plan of Care (Signed)
Patient stable, denies feeling short of breath however does state he feels more weak today.  Unable to wean O2, oxygen saturation between 90-91% on 3.5 liters.  Dry cough, Robitussin DM x 1 given with some improvement.  Tylenol x 1 given for chronic back pain with some relief.  Patient up and down to [facility] multiple times this shift.

## 2017-02-10 NOTE — Progress Notes (Signed)
SATURATION QUALIFICATIONS: (This note is used to comply with regulatory documentation for home oxygen)  Patient Saturations on Room Air at Rest = 84%  Patient Saturations on Room Air while Ambulating = 82-84%  Patient Saturations on 3.5 Liters of oxygen while Ambulating = 90%   Please briefly explain why patient needs home oxygen: patient desats even at rest to 84%, took several minutes for oxygen level to come back up to 90% once oxygen at 3.5 liters resumed

## 2017-02-11 LAB — CULTURE, RESPIRATORY W GRAM STAIN
Culture: NORMAL
Special Requests: NORMAL

## 2017-02-11 LAB — GLUCOSE, CAPILLARY
GLUCOSE-CAPILLARY: 146 mg/dL — AB (ref 65–99)
GLUCOSE-CAPILLARY: 105 mg/dL — AB (ref 65–99)
Glucose-Capillary: 98 mg/dL (ref 65–99)
Glucose-Capillary: 141 mg/dL — ABNORMAL HIGH (ref 65–99)

## 2017-02-11 LAB — CULTURE, RESPIRATORY

## 2017-02-11 MED ORDER — LOPERAMIDE HCL 2 MG PO CAPS
2.0000 mg | ORAL_CAPSULE | ORAL | Status: DC | PRN
  Administered 2017-02-11: 2 mg via ORAL
  Filled 2017-02-11: qty 1

## 2017-02-11 NOTE — Progress Notes (Signed)
PROGRESS NOTE  Alexander Barnett [id number] DOB: November 30, 1920 DOA: 02/08/2017 PCP: Alessandra Grout, MD (Inactive)  Brief Narrative: 89yom presented with dizziness for several days, was concerned about reaction to Epogen. Hematologist referred patient to ED. Other complaints included productive cough. Admitted for pneumonia, possible UTI, dizziness  Assessment/Plan Acute hypoxic resp failure secondary to pneumonia, possibly RSV - somewhat better today; less hypoxia but remains SOB and has wheezing - will continue current Rx with abx given slow improvement  DM type 2 - CBG stable; continue SSI  Dizziness. CT head negative. - presume secondary to acute illness - echo showed normal LVEF, grade 2 diastolic dysfunction, slight worsening of aortic stenosis - carotid u/s was unremarkable - no further evaluation planned  Chronic normocytic anemia followed by Dr. Irene Limbo. - Hgb stable, f/u as outpatient  Aortic stenosis  - echo showed slight worsening of aortic stenosis, now moderate to severe  CKD stage III - creatinine stable  Moderate malnutrition  - per dietician  Aortic atherosclerosis  - would not treat  Overall improving; anticipate discharge home 1/28 if continues to improve.  DVT prophylaxis: enoxaparin  Code Status: full Family Communication: none Disposition Plan: home    Murray Hodgkins, MD  [facility] Direct contact: [phone] --[place]ia [place]  --[email or web address]; password [email or web address]  7PM-7AM contact night coverage as above 02/11/2017, 3:57 PM  LOS: 3 days   Consultants:    Procedures:  Echo Study Conclusions  - Left ventricle: The cavity size was normal. There was mild   concentric hypertrophy. Systolic function was normal. The   estimated ejection fraction was in the range of 60% to 65%. Wall   motion was normal; there were no regional wall motion   abnormalities. Features are consistent with a pseudonormal left   ventricular filling pattern,  with concomitant abnormal relaxation   and increased filling pressure (grade 2 diastolic dysfunction). - Aortic valve: Right coronary cusp mobility was severely   restricted. There was moderate to severe stenosis. There was   trivial regurgitation. Valve area (VTI): 0.94 cm^2. - Left atrium: The atrium was moderately dilated. - Tricuspid valve: There was mild-moderate regurgitation directed   centrally. - Pulmonary arteries: Systolic pressure was moderately increased.   PA peak pressure: 51 mm Hg (S).  Impressions:  - Aortic stenosis has worsened slightly since the previous study.  Antimicrobials:  Ceftriaxone 1/24 >>  Azithromycin 1/24 >>  Interval history/Subjective: Had a choking episode earlier on food but feels better now. RN reports wheezing earlier today but has been able to wean from 4 >> 1 L [place].  Objective: Vitals:  Vitals:   02/11/17 1219 02/11/17 1311  BP:  137/74  Pulse:  79  Resp:  18  Temp:  98 F (36.7 C)  SpO2: 91% 91%    Exam:  Constitutional:   . Appears calm and comfortable Respiratory:  . Bilateral wheezes, no rales or rhonchi. Marland Kitchen Respiratory effort mildly increased Cardiovascular:  . RRR, no m/r/g Psychiatric:  . Mental status o Mood, affect appropriate  I have personally reviewed the following:   Labs:  CBG stable  Scheduled Meds: . amLODipine  5 mg Oral Daily  . atenolol  25 mg Oral Daily  . azithromycin  500 mg Oral QHS  . cholecalciferol  1,000 Units Oral Daily  . enoxaparin (LOVENOX) injection  30 mg Subcutaneous Q24H  . feeding supplement (GLUCERNA SHAKE)  237 mL Oral TID BM  . finasteride  5 mg Oral q morning - 10a  . insulin  aspart  0-5 Units Subcutaneous QHS  . insulin aspart  0-9 Units Subcutaneous TID WC  . latanoprost  1 drop Both Eyes QHS  . losartan  100 mg Oral Daily  . mouth rinse  15 mL Mouth Rinse BID  . multivitamin with minerals  1 tablet Oral Daily   Continuous Infusions: . cefTRIAXone (ROCEPHIN)  IV  Stopped (02/10/17 2034)    Principal Problem:   CAP (community acquired pneumonia) Active Problems:   Anemia   CKD (chronic kidney disease) stage 3, GFR 30-59 ml/min (HCC)   Aortic stenosis   Malnutrition of moderate degree   RSV (respiratory syncytial virus pneumonia)   Acute hypoxemic respiratory failure (HCC)   DM type 2 (diabetes mellitus, type 2) ([place])   Aortic atherosclerosis ([place])   LOS: 3 days

## 2017-02-11 NOTE — Plan of Care (Signed)
VSS, patient able to wean down to 1 liter oxygen today and maintain sats above 90%, 87-89% on room air.  Patient noticeably more confused today, impulsive, able to be reoriented easily.  Spent some time sitting up in chair this shift.  Friend in to visit.

## 2017-02-11 NOTE — Progress Notes (Signed)
Patient denies shortness of breath.  Does continues to have a congested cough this morning.  This RN noted expiratory wheezing throughout on exam, clear yesterday morning to my exam and clear last night per night nurse.  Albuterol nebulizer administered as per prn order for wheezing.  O2 sat 95% on 4 liters oxygen per nasal cannula, will attempt to wean down oxygen.    Will continue to monitor.

## 2017-02-12 DIAGNOSIS — E440 Moderate protein-calorie malnutrition: Secondary | ICD-10-CM

## 2017-02-12 DIAGNOSIS — E1129 Type 2 diabetes mellitus with other diabetic kidney complication: Secondary | ICD-10-CM

## 2017-02-12 LAB — GLUCOSE, CAPILLARY
GLUCOSE-CAPILLARY: 104 mg/dL — AB (ref 65–99)
Glucose-Capillary: 101 mg/dL — ABNORMAL HIGH (ref 65–99)

## 2017-02-12 MED ORDER — CEFUROXIME AXETIL 500 MG PO TABS: 500.0000 mg | ORAL_TABLET | Freq: Every day | ORAL | 0 refills | Status: DC

## 2017-02-12 MED ORDER — AZITHROMYCIN 250 MG PO TABS
500.0000 mg | ORAL_TABLET | Freq: Once | ORAL | Status: AC
  Administered 2017-02-12: 500 mg via ORAL
  Filled 2017-02-12: qty 2

## 2017-02-12 NOTE — Progress Notes (Signed)
SATURATION QUALIFICATIONS: (This note is used to comply with regulatory documentation for home oxygen)  Patient Saturations on Room Air at Rest = 93%  Patient Saturations on Room Air while Ambulating = 87%  Patient Saturations on 2 Liters of oxygen while Ambulating = 96%  Please briefly explain why patient needs home oxygen: 

## 2017-02-12 NOTE — Care Management Note (Signed)
Case Management Note  Patient Details  Name: Alexander Barnett MRN: [id number] Date of Birth: April 07, 1920  Subjective/Objective:    Pt admitted with CAP                Action/Plan: Home with [place]   Expected Discharge Date:  (unknown)               Expected Discharge Plan:  [place]  In-House Referral:     Discharge planning Services  CM Consult  Post Acute Care Choice:    Choice offered to:     DME Arranged:    DME Agency:     HH Arranged:  RN, PT [place] Agency:  [place]  Status of Service:  Completed, signed off  If discussed at [place] of Stay Meetings, dates discussed:    Additional CommentsPurcell Mouton, RN 02/12/2017, 11:50 AM

## 2017-02-12 NOTE — Evaluation (Signed)
Physical Therapy Evaluation Patient Details Name: Alexander Barnett MRN: [id number] DOB: [birth year] Today's Date: 02/12/2017   History of Present Illness  Pt admitted with resp failure 2* PNA and with hx of bladder CA, DM and CKD  Clinical Impression  Pt admitted as above and presenting with functional mobility limitations 2* generalized weakness and ambulatory balance deficits.  Pt hopes to regain IND and dc to home.  This am, pt ambulated 500' in halls with min guard assist and RW on RA and desat to 87% with pt admitting mild SOB.    Follow Up Recommendations Home health PT    Equipment Recommendations  Rolling walker with 5" wheels    Recommendations for Other Services OT consult     Precautions / Restrictions Precautions Precautions: Fall Restrictions Weight Bearing Restrictions: No      Mobility  Bed Mobility               General bed mobility comments: NT - pt OOB with nursing  Transfers Overall transfer level: Needs assistance Equipment used: Rolling walker (2 wheeled) Transfers: Sit to/from Stand Sit to Stand: Min guard         General transfer comment: min cues for use of UEs to self assist.  Steady assist with initial standing  Ambulation/Gait Ambulation/Gait assistance: Min guard Ambulation Distance (Feet): 500 Feet Assistive device: Rolling walker (2 wheeled);None Gait Pattern/deviations: Step-through pattern;Decreased step length - right;Decreased step length - left;Shuffle;Trunk flexed Gait velocity: mod pace   General Gait Details: 450' with RW, min guard/sup assist and cues for posture and position from RW.  Additional 82' sans AD but with noted increased instability.  Pt states feels safer with RW  Stairs            Wheelchair Mobility    Modified Rankin (Stroke Patients Only)       Balance                                             Pertinent Vitals/Pain Pain Assessment: No/denies pain    Home Living  Family/patient expects to be discharged to:: Private residence Living Arrangements: Alone Available Help at Discharge: Friend(s);Available PRN/intermittently Type of Home: House Home Access: Stairs to enter Entrance Stairs-Rails: Right;Left;Can reach both Entrance Stairs-Number of Steps: 3 Home Layout: Two level Home Equipment: Cane - single point      Prior Function Level of Independence: Independent               Hand Dominance        Extremity/Trunk Assessment   Upper Extremity Assessment Upper Extremity Assessment: Generalized weakness    Lower Extremity Assessment Lower Extremity Assessment: Generalized weakness    Cervical / Trunk Assessment Cervical / Trunk Assessment: Kyphotic  Communication   Communication: HOH  Cognition Arousal/Alertness: Awake/alert Behavior During Therapy: WFL for tasks assessed/performed Overall Cognitive Status: Within Functional Limits for tasks assessed                                        General Comments      Exercises     Assessment/Plan    PT Assessment Patient needs continued PT services  PT Problem List Decreased strength;Decreased activity tolerance;Decreased balance;Decreased mobility;Decreased knowledge of use of DME       PT  Treatment Interventions DME instruction;Gait training;Stair training;Functional mobility training;Therapeutic activities;Therapeutic exercise;Patient/family education;Balance training    PT Goals (Current goals can be found in the Care Plan section)  Acute Rehab PT Goals Patient Stated Goal: Regain IND and return home PT Goal Formulation: With patient Time For Goal Achievement: 02/24/17 Potential to Achieve Goals: Good    Frequency Min 3X/week   Barriers to discharge Decreased caregiver support Home alone    Co-evaluation               AM-PAC PT "6 Clicks" Daily Activity  Outcome Measure Difficulty turning over in bed (including adjusting bedclothes,  sheets and blankets)?: A Little Difficulty moving from lying on back to sitting on the side of the bed? : A Little Difficulty sitting down on and standing up from a chair with arms (e.g., wheelchair, bedside commode, etc,.)?: A Little Help needed moving to and from a bed to chair (including a wheelchair)?: A Little Help needed walking in hospital room?: A Little Help needed climbing 3-5 steps with a railing? : A Little 6 Click Score: 18    End of Session Equipment Utilized During Treatment: Gait belt;Oxygen Activity Tolerance: Patient tolerated treatment well Patient left: in chair;with call bell/phone within reach;with chair alarm set Nurse Communication: Mobility status PT Visit Diagnosis: Unsteadiness on feet (R26.81);Difficulty in walking, not elsewhere classified (R26.2);Muscle weakness (generalized) (M62.81)    Time: 2197-5883 PT Time Calculation (min) (ACUTE ONLY): 27 min   Charges:   PT Evaluation $PT Eval Low Complexity: 1 Low PT Treatments $Gait Training: 8-22 mins   PT G Codes:        Pg [phone]   Destyn Parfitt 02/12/2017, 10:52 AM

## 2017-02-12 NOTE — Care Management Important Message (Signed)
Important Message  Patient Details  Name: Alexander Barnett MRN: [id number] Date of Birth: 1920/02/23   Medicare Important Message Given:  Yes    Kerin Salen 02/12/2017, 12:24 [place] Message  Patient Details  Name: Alexander Barnett MRN: [id number] Date of Birth: 08/23/1920   Medicare Important Message Given:  Yes    Kerin Salen 02/12/2017, 12:24 PM

## 2017-02-12 NOTE — Progress Notes (Signed)
Continues to be confused and it escalates at night when he takes off his oxygen or when his oxygen is weaned down to 1l/m.  Sats on RA and 1l/m at night are mid 80's to 88.  Confusion improved with oxygen at 3l/m and sats are 93%..  Also pt is unsteady and requires one person assist to [facility].  States he lives at home alone.

## 2017-02-12 NOTE — Discharge Summary (Addendum)
Physician Discharge Summary  [name] XAJ:[id number] DOB: Jun 20, 1920 DOA: 02/08/2017  PCP: Alessandra Grout, MD (Inactive)  Admit date: 02/08/2017 Discharge date: 02/12/2017  Recommendations for Outpatient Follow-up:   Acute hypoxic resp failure secondary to pneumonia, possibly RSV - much improved clinically - will need home oxygen 2-3 L continuous [place], likely short term  Aortic stenosis  - echo showed slight worsening of aortic stenosis, now moderate to severe - f/u as an outpatient  Moderate malnutrition   Follow-up Information    Alessandra Grout, MD. Schedule an appointment as soon as possible for a visit in 1 week(s).   Specialty:  Family Medicine Contact information: [address] [place], [place] [address] [phone]            Discharge Diagnoses:  1. Acute hypoxic resp failure secondary to pneumonia 2. RSV URI 3. DM type 2 4. Dizziness 5. Chronic normocytic anemia 6. Aortic stenosis  7. CKD stage III 8. Moderate malnutrition  9. Aortic atherosclerosis   Discharge Condition: improved Disposition: home with [facility] RN, PT, home oxygen 2-3 liters [place], expected to be short term need  Diet recommendation: diabetic diet  Filed Weights   02/08/17 1352 02/08/17 2039  Weight: 63.5 kg (140 lb) 64.2 kg (141 lb 8.6 oz)    History of present illness:  Alexander Barnett presented with dizziness for several days, was concerned about reaction to Epogen. Hematologist referred patient to ED. Other complaints included productive cough. Admitted for pneumonia, possible UTI, dizziness  Hospital Course:  Patient slowly improved with empiric antibiotics with gradual decrease in hypoxia. He ambulated well with PT and will continue PT in the outpatient setting. Will need home oxygen likely short term. Episodes of confusion intermittent, expect to resolve as continues to improve. Oriented on discharge. Discussed with neighbor, Faith, who will check in on him and help him get  settled. Hospitalization was uncomplicated, individual issues as below.   Acute hypoxic resp failure secondary to pneumonia, possibly RSV - much improved clinically - will need home oxygen 2-3 L continuous [place], likely short term  DM type 2 - stable  Dizziness. CT head negative. - presume secondary to acute illness - echo showed normal LVEF, grade 2 diastolic dysfunction, slight worsening of aortic stenosis - carotid u/s was unremarkable - no further evaluation planned  Chronic normocytic anemia followed by Dr. Irene Limbo. - Hgb stable, f/u as outpatient  Aortic stenosis  - echo showed slight worsening of aortic stenosis, now moderate to severe - f/u as an outpatient  CKD stage III - stable  Moderate malnutrition  - per dietician  Aortic atherosclerosis  - would not treat  Consultants:  None   Procedures:  Echo Study Conclusions  - Left ventricle: The cavity size was normal. There was mild concentric hypertrophy. Systolic function was normal. The estimated ejection fraction was in the range of 60% to 65%. Wall motion was normal; there were no regional wall motion abnormalities. Features are consistent with a pseudonormal left ventricular filling pattern, with concomitant abnormal relaxation and increased filling pressure (grade 2 diastolic dysfunction). - Aortic valve: Right coronary cusp mobility was severely restricted. There was moderate to severe stenosis. There was trivial regurgitation. Valve area (VTI): 0.94 cm^2. - Left atrium: The atrium was moderately dilated. - Tricuspid valve: There was mild-moderate regurgitation directed centrally. - Pulmonary arteries: Systolic pressure was moderately increased. PA peak pressure: 51 mm Hg (S).  Impressions:  - Aortic stenosis has worsened slightly since the previous study.  Antimicrobials:  Ceftriaxone  1/24 >> 1/28  Ceftin 1/28 >> 1/30  Azithromycin 1/24 >> 1/28    Today's  assessment: S: RN noted patient was anxiously earlier today. Has had intermittent confusion during hospitalization. Currently much better per RN.  Patient breathing better, feels better. No new issues. Did will with PT. Has bills to pay and appointments to reschedule at home. O: Vitals:  Vitals:   02/12/17 0431 02/12/17 0500  BP: (!) 159/74   Pulse: 84   Resp: 18   Temp: 98 F (36.7 C)   SpO2: 97% 93%    Constitutional:  . Appears calm and comfortable sitting in chair ENMT:  . grossly normal hearing  Respiratory:  . Fair air movement, some rhonchi, no wheezes . Respiratory effort normal. Speaks in full sentences Cardiovascular:  . RRR, soft 2/6 systolic murmur. No rub or gallop . No LE extremity edema   Psychiatric:  . judgement and insight appear acceptable . Mental status o Mood, affect appropriate o Orientated to person, hospital, hospital room, month, year   Sputum culture normal flora.  Discharge Instructions  Discharge Instructions    Diet Carb Modified   Complete by:  As directed    Discharge instructions   Complete by:  As directed    Call your physician or seek immediate medical attention for shortness of breath, fever, confusion or worsening of condition.   Increase activity slowly   Complete by:  As directed      Allergies as of 02/12/2017      Reactions   Amoxicillin Diarrhea   Has patient had a PCN reaction causing immediate rash, facial/tongue/throat swelling, SOB or lightheadedness with hypotension: no Has patient had a PCN reaction causing severe rash involving mucus membranes or skin necrosis: no Has patient had a PCN reaction that required hospitalization: unknown Has patient had a PCN reaction occurring within the last 10 years: no If all of the above answers are "NO", then may proceed with Cephalosporin use.   Ciprofloxacin Other (See Comments)   Muscle strain in groin      Medication List    TAKE these medications   amLODipine 5 MG  tablet Commonly known as:  NORVASC Take 1 tablet (5 mg total) by mouth daily.   atenolol 25 MG tablet Commonly known as:  TENORMIN Take 1 tablet (25 mg total) by mouth daily. Patient needs to keep 03/02/17 appointment for further refills   cefUROXime 500 MG tablet Commonly known as:  CEFTIN Take 1 tablet (500 mg total) by mouth at bedtime for 3 days.   cholecalciferol 1000 units tablet Commonly known as:  VITAMIN D Take 1,000 Units by mouth daily.   Co Q-10 100 MG Caps Take 1 capsule by mouth daily.   diphenoxylate-atropine 2.5-0.025 MG tablet Commonly known as:  LOMOTIL Take 1 tablet by mouth daily as needed for diarrhea or loose stools.   finasteride 5 MG tablet Commonly known as:  PROSCAR Take 5 mg by mouth every morning.   ICAPS AREDS 2 Caps Take 1 capsule by mouth daily.   losartan 100 MG tablet Commonly known as:  COZAAR Take 1 tablet (100 mg total) by mouth daily. Please keep upcoming appt with Dr.Turner in December. Thanks   multivitamin with minerals Tabs tablet Take 1 tablet by mouth daily.   travoprost (benzalkonium) 0.004 % ophthalmic solution Commonly known as:  TRAVATAN Place 1 drop into both eyes at bedtime.            Durable Medical Equipment  (From  admission, onward)        Start     Ordered   02/12/17 1205  For home use only DME 4 wheeled rolling walker with seat  Once    Question:  Patient needs a walker to treat with the following condition  Answer:  Unsteady gait   02/12/17 1205   02/12/17 0729  For home use only DME oxygen  Once    Question Answer Comment  Mode or (Route) Nasal cannula   Liters per Minute 3   Frequency Continuous (stationary and portable oxygen unit needed)   Oxygen delivery system Gas      02/12/17 0729     Allergies  Allergen Reactions  . Amoxicillin Diarrhea    Has patient had a PCN reaction causing immediate rash, facial/tongue/throat swelling, SOB or lightheadedness with hypotension: no Has patient had a  PCN reaction causing severe rash involving mucus membranes or skin necrosis: no Has patient had a PCN reaction that required hospitalization: unknown Has patient had a PCN reaction occurring within the last 10 years: no If all of the above answers are "NO", then may proceed with Cephalosporin use.  . Ciprofloxacin Other (See Comments)    Muscle strain in groin    The results of significant diagnostics from this hospitalization (including imaging, microbiology, ancillary and laboratory) are listed below for reference.    Significant Diagnostic Studies: Dg Chest 2 View  Result Date: 02/08/2017 CLINICAL DATA:  Anemia, low iron, dizziness and nausea beginning at 1500 hours, cough, congestion and crackles, history pulmonary hypertension, diabetes mellitus, stage III chronic kidney disease, diastolic dysfunction, transitional cell carcinoma of the bladder, prostate cancer, former smoker EXAM: CHEST  2 VIEW COMPARISON:  06/20/2012 FINDINGS: Enlargement of cardiac silhouette. Normal mediastinal contours and pulmonary vascularity. Atherosclerotic calcification aorta. Emphysematous and bronchitic changes with calcified granuloma in lower RIGHT chest. Atelectasis versus consolidation in BILATERAL lower lobes greater on RIGHT. Remaining lungs clear. No acute infiltrate, pleural effusion or pneumothorax. Bones demineralized. IMPRESSION: Bibasilar atelectasis versus consolidation greater on RIGHT new since previous exam. Enlargement of cardiac silhouette with underlying emphysematous changes. Aortic atherosclerosis. Electronically Signed   By: Lavonia Dana M.D.   On: 02/08/2017 17:17   Ct Head Wo Contrast  Result Date: 02/08/2017 CLINICAL DATA:  Dizziness and nausea EXAM: CT HEAD WITHOUT CONTRAST TECHNIQUE: Contiguous axial images were obtained from the base of the skull through the vertex without intravenous contrast. COMPARISON:  None. FINDINGS: Brain: No mass lesion, intraparenchymal hemorrhage or extra-axial  collection. No evidence of acute cortical infarct. There is periventricular hypoattenuation compatible with chronic microvascular disease. Vascular: No hyperdense vessel or unexpected calcification. Skull: Normal visualized skull base, calvarium and extracranial soft tissues. Sinuses/Orbits: No sinus fluid levels or advanced mucosal thickening. No mastoid effusion. Normal orbits. IMPRESSION: Mild chronic microvascular changes without acute abnormality. Electronically Signed   By: Ulyses Jarred M.D.   On: 02/08/2017 17:08    Microbiology: Recent Results (from the past 240 hour(s))  Culture, blood (routine x 2) Call MD if unable to obtain prior to antibiotics being given     Status: None (Preliminary result)   Collection Time: 02/08/17  8:50 PM  Result Value Ref Range Status   Specimen Description BLOOD LEFT ANTECUBITAL  Final   Special Requests   Final    BOTTLES DRAWN AEROBIC AND ANAEROBIC Blood Culture adequate volume   Culture   Final    NO GROWTH 3 DAYS Performed at [facility], [address] [place], [place]  [address]    Report Status PENDING  Incomplete  Respiratory Panel by PCR     Status: Abnormal   Collection Time: 02/08/17  8:53 PM  Result Value Ref Range Status   Adenovirus NOT DETECTED NOT DETECTED Final   Coronavirus 229E NOT DETECTED NOT DETECTED Final   Coronavirus HKU1 NOT DETECTED NOT DETECTED Final   Coronavirus NL63 NOT DETECTED NOT DETECTED Final   Coronavirus OC43 NOT DETECTED NOT DETECTED Final   Metapneumovirus NOT DETECTED NOT DETECTED Final   Rhinovirus / Enterovirus NOT DETECTED NOT DETECTED Final   Influenza A NOT DETECTED NOT DETECTED Final   Influenza B NOT DETECTED NOT DETECTED Final   Parainfluenza Virus 1 NOT DETECTED NOT DETECTED Final   Parainfluenza Virus 2 NOT DETECTED NOT DETECTED Final   Parainfluenza Virus 3 NOT DETECTED NOT DETECTED Final   Parainfluenza Virus 4 NOT DETECTED NOT DETECTED Final   Respiratory Syncytial Virus DETECTED (A)  NOT DETECTED Final    Comment: CRITICAL RESULT CALLED TO, READ BACK BY AND VERIFIED WITH: B HODGES RN 02/09/17 0554A JDW    Bordetella pertussis NOT DETECTED NOT DETECTED Final   Chlamydophila pneumoniae NOT DETECTED NOT DETECTED Final   Mycoplasma pneumoniae NOT DETECTED NOT DETECTED Final    Comment: Performed at [facility], [address] [place], [place] [address]  Culture, blood (routine x 2) Call MD if unable to obtain prior to antibiotics being given     Status: None (Preliminary result)   Collection Time: 02/08/17  9:02 PM  Result Value Ref Range Status   Specimen Description BLOOD RIGHT ANTECUBITAL  Final   Special Requests   Final    BOTTLES DRAWN AEROBIC AND ANAEROBIC Blood Culture adequate volume   Culture   Final    NO GROWTH 3 DAYS Performed at [facility], [place] [address], [place], [place] [address]    Report Status PENDING  Incomplete  Culture, sputum-assessment     Status: None   Collection Time: 02/09/17  5:34 AM  Result Value Ref Range Status   Specimen Description SPUTUM  Final   Special Requests Normal  Final   Sputum evaluation THIS SPECIMEN IS ACCEPTABLE FOR SPUTUM CULTURE  Final   Report Status 02/09/2017 FINAL  Final  Culture, respiratory (NON-Expectorated)     Status: None   Collection Time: 02/09/17  5:34 AM  Result Value Ref Range Status   Specimen Description SPUTUM  Final   Special Requests Normal Reflexed from [id number]  Final   Gram Stain   Final    ABUNDANT WBC PRESENT, PREDOMINANTLY PMN RARE SQUAMOUS EPITHELIAL CELLS PRESENT FEW GRAM POSITIVE COCCI IN CHAINS RARE GRAM NEGATIVE RODS RARE GRAM POSITIVE RODS    Culture   Final    FEW Consistent with normal respiratory flora. Performed at [facility], [place] [address], [place],  [address]    Report Status 02/11/2017 FINAL  Final     Labs: Basic Metabolic Panel: Recent Labs  Lab 02/08/17 1606  NA 136  K 4.2  CL 104  CO2 24  GLUCOSE 120*  BUN 29*  CREATININE  1.46*  CALCIUM 9.2   CBC: Recent Labs  Lab 02/07/17 1019 02/08/17 1606 02/10/17 0438  WBC 11.0* 25.1* 13.0*  NEUTROABS 8.5* 21.8*  --   HGB  --  8.7* 8.0*  HCT 25.7* 27.3* 24.3*  MCV 82.9 82.0 81.5  PLT 223 198 161    CBG: Recent Labs  Lab 02/11/17 1139 02/11/17 1645  02/11/17 2047 02/12/17 0732 02/12/17 1144  GLUCAP 98 141* 105* 104* 101*    Principal Problem:   CAP (community acquired pneumonia) Active Problems:   Anemia   CKD (chronic kidney disease) stage 3, GFR 30-59 ml/min (HCC)   Aortic stenosis   Malnutrition of moderate degree   RSV (respiratory syncytial virus pneumonia)   Acute hypoxemic respiratory failure (HCC)   DM type 2 (diabetes mellitus, type 2) ([place])   Aortic atherosclerosis ([place])   Time coordinating discharge: 35 minutes  Signed:  Murray Hodgkins, MD [facility] 02/12/2017, 12:12 PM

## 2017-02-12 NOTE — Progress Notes (Signed)
Sat on 3l/m this AM is 97%.  Have decreased O2 to 2l/lm and will continue to monitor

## 2017-02-13 ENCOUNTER — Other Ambulatory Visit: Payer: Self-pay | Admitting: Cardiology

## 2017-02-13 LAB — CULTURE, BLOOD (ROUTINE X 2)
CULTURE: NO GROWTH
CULTURE: NO GROWTH
SPECIAL REQUESTS: ADEQUATE
Special Requests: ADEQUATE

## 2017-02-14 ENCOUNTER — Inpatient Hospital Stay: Payer: Medicare Other

## 2017-02-14 DIAGNOSIS — D4620 Refractory anemia with excess of blasts, unspecified: Secondary | ICD-10-CM

## 2017-02-14 DIAGNOSIS — N183 Chronic kidney disease, stage 3 unspecified: Secondary | ICD-10-CM

## 2017-02-14 DIAGNOSIS — D649 Anemia, unspecified: Secondary | ICD-10-CM

## 2017-02-14 LAB — CBC WITH DIFFERENTIAL (CANCER CENTER ONLY)
BASOS PCT: 1 %
Basophils Absolute: 0.1 10*3/uL (ref 0.0–0.1)
EOS ABS: 0.1 10*3/uL (ref 0.0–0.5)
EOS PCT: 2 %
HCT: 27.4 % — ABNORMAL LOW (ref 38.4–49.9)
Hemoglobin: 8.6 g/dL — ABNORMAL LOW (ref 13.0–17.1)
LYMPHS ABS: 0.9 10*3/uL (ref 0.9–3.3)
Lymphocytes Relative: 11 %
MCH: 26.1 pg — ABNORMAL LOW (ref 27.2–33.4)
MCHC: 31.4 g/dL — AB (ref 32.0–36.0)
MCV: 83.3 fL (ref 79.3–98.0)
MONO ABS: 1.1 10*3/uL — AB (ref 0.1–0.9)
Monocytes Relative: 14 %
Neutro Abs: 6.1 10*3/uL (ref 1.5–6.5)
Neutrophils Relative %: 72 %
PLATELETS: 322 10*3/uL (ref 140–400)
RBC: 3.29 MIL/uL — ABNORMAL LOW (ref 4.20–5.82)
RDW: 21.8 % — ABNORMAL HIGH (ref 11.0–14.6)
WBC Count: 8.3 10*3/uL (ref 4.0–10.3)

## 2017-02-14 LAB — RETICULOCYTES
RBC.: 3.29 MIL/uL — ABNORMAL LOW (ref 4.20–5.82)
RETIC CT PCT: 3.2 % — AB (ref 0.8–1.8)
Retic Count, Absolute: 105.3 10*3/uL — ABNORMAL HIGH (ref 34.8–93.9)

## 2017-02-14 MED ORDER — DARBEPOETIN ALFA 200 MCG/0.4ML IJ SOSY: 200.0000 ug | PREFILLED_SYRINGE | Freq: Once | INTRAMUSCULAR | Status: DC

## 2017-02-14 NOTE — Progress Notes (Signed)
Pt had labs drawn today. Had Aranesp injection last week, could not give injection today as order is to give injection every two weeks. Patient taken to scheduling to have injection appointments rescheduled to reflect a treatment order of injection of Aransesp every two weeks. Pt did not receive treatment today.

## 2017-02-15 ENCOUNTER — Encounter (HOSPITAL_COMMUNITY): Payer: Self-pay | Admitting: Emergency Medicine

## 2017-02-15 ENCOUNTER — Emergency Department (HOSPITAL_COMMUNITY): Payer: Medicare Other

## 2017-02-15 ENCOUNTER — Inpatient Hospital Stay (HOSPITAL_COMMUNITY): Payer: Medicare Other

## 2017-02-15 ENCOUNTER — Other Ambulatory Visit: Payer: Self-pay

## 2017-02-15 ENCOUNTER — Inpatient Hospital Stay (HOSPITAL_COMMUNITY)
Admission: EM | Admit: 2017-02-15 | Discharge: 2017-02-17 | DRG: 193 | Disposition: A | Discharge status: Skilled Nursing Facility | Payer: Medicare Other | Attending: Family Medicine | Admitting: Family Medicine

## 2017-02-15 DIAGNOSIS — E1122 Type 2 diabetes mellitus with diabetic chronic kidney disease: Secondary | ICD-10-CM | POA: Diagnosis present

## 2017-02-15 DIAGNOSIS — I272 Pulmonary hypertension, unspecified: Secondary | ICD-10-CM | POA: Diagnosis present

## 2017-02-15 DIAGNOSIS — I350 Nonrheumatic aortic (valve) stenosis: Secondary | ICD-10-CM | POA: Diagnosis present

## 2017-02-15 DIAGNOSIS — M545 Low back pain, unspecified: Secondary | ICD-10-CM

## 2017-02-15 DIAGNOSIS — D500 Iron deficiency anemia secondary to blood loss (chronic): Secondary | ICD-10-CM

## 2017-02-15 DIAGNOSIS — J189 Pneumonia, unspecified organism: Principal | ICD-10-CM | POA: Diagnosis present

## 2017-02-15 DIAGNOSIS — D4620 Refractory anemia with excess of blasts, unspecified: Secondary | ICD-10-CM | POA: Diagnosis present

## 2017-02-15 DIAGNOSIS — Z8546 Personal history of malignant neoplasm of prostate: Secondary | ICD-10-CM | POA: Diagnosis not present

## 2017-02-15 DIAGNOSIS — Z66 Do not resuscitate: Secondary | ICD-10-CM | POA: Diagnosis present

## 2017-02-15 DIAGNOSIS — J9621 Acute and chronic respiratory failure with hypoxia: Secondary | ICD-10-CM | POA: Diagnosis present

## 2017-02-15 DIAGNOSIS — R319 Hematuria, unspecified: Secondary | ICD-10-CM | POA: Diagnosis present

## 2017-02-15 DIAGNOSIS — N183 Chronic kidney disease, stage 3 unspecified: Secondary | ICD-10-CM | POA: Diagnosis present

## 2017-02-15 DIAGNOSIS — I5189 Other ill-defined heart diseases: Secondary | ICD-10-CM | POA: Diagnosis present

## 2017-02-15 DIAGNOSIS — Z85828 Personal history of other malignant neoplasm of skin: Secondary | ICD-10-CM | POA: Diagnosis not present

## 2017-02-15 DIAGNOSIS — Z881 Allergy status to other antibiotic agents status: Secondary | ICD-10-CM

## 2017-02-15 DIAGNOSIS — Z923 Personal history of irradiation: Secondary | ICD-10-CM

## 2017-02-15 DIAGNOSIS — I10 Essential (primary) hypertension: Secondary | ICD-10-CM | POA: Diagnosis present

## 2017-02-15 DIAGNOSIS — S32019A Unspecified fracture of first lumbar vertebra, initial encounter for closed fracture: Secondary | ICD-10-CM | POA: Diagnosis present

## 2017-02-15 DIAGNOSIS — Z905 Acquired absence of kidney: Secondary | ICD-10-CM | POA: Diagnosis not present

## 2017-02-15 DIAGNOSIS — I1310 Hypertensive heart and chronic kidney disease without heart failure, with stage 1 through stage 4 chronic kidney disease, or unspecified chronic kidney disease: Secondary | ICD-10-CM | POA: Diagnosis present

## 2017-02-15 DIAGNOSIS — I951 Orthostatic hypotension: Secondary | ICD-10-CM | POA: Diagnosis not present

## 2017-02-15 DIAGNOSIS — Z85528 Personal history of other malignant neoplasm of kidney: Secondary | ICD-10-CM | POA: Diagnosis not present

## 2017-02-15 DIAGNOSIS — J181 Lobar pneumonia, unspecified organism: Secondary | ICD-10-CM

## 2017-02-15 DIAGNOSIS — M549 Dorsalgia, unspecified: Secondary | ICD-10-CM | POA: Diagnosis present

## 2017-02-15 DIAGNOSIS — Z6822 Body mass index (BMI) 22.0-22.9, adult: Secondary | ICD-10-CM

## 2017-02-15 DIAGNOSIS — W010XXA Fall on same level from slipping, tripping and stumbling without subsequent striking against object, initial encounter: Secondary | ICD-10-CM | POA: Diagnosis present

## 2017-02-15 DIAGNOSIS — D469 Myelodysplastic syndrome, unspecified: Secondary | ICD-10-CM | POA: Diagnosis present

## 2017-02-15 DIAGNOSIS — Z8551 Personal history of malignant neoplasm of bladder: Secondary | ICD-10-CM

## 2017-02-15 DIAGNOSIS — Y92009 Unspecified place in unspecified non-institutional (private) residence as the place of occurrence of the external cause: Secondary | ICD-10-CM

## 2017-02-15 DIAGNOSIS — N400 Enlarged prostate without lower urinary tract symptoms: Secondary | ICD-10-CM | POA: Diagnosis present

## 2017-02-15 DIAGNOSIS — E440 Moderate protein-calorie malnutrition: Secondary | ICD-10-CM | POA: Diagnosis present

## 2017-02-15 DIAGNOSIS — I4819 Other persistent atrial fibrillation: Secondary | ICD-10-CM | POA: Diagnosis present

## 2017-02-15 DIAGNOSIS — Z9289 Personal history of other medical treatment: Secondary | ICD-10-CM

## 2017-02-15 DIAGNOSIS — Z87891 Personal history of nicotine dependence: Secondary | ICD-10-CM

## 2017-02-15 DIAGNOSIS — E119 Type 2 diabetes mellitus without complications: Secondary | ICD-10-CM

## 2017-02-15 DIAGNOSIS — W19XXXA Unspecified fall, initial encounter: Secondary | ICD-10-CM

## 2017-02-15 DIAGNOSIS — D649 Anemia, unspecified: Secondary | ICD-10-CM | POA: Diagnosis present

## 2017-02-15 LAB — HEMOGLOBIN A1C
Hgb A1c MFr Bld: 5 % (ref 4.8–5.6)
MEAN PLASMA GLUCOSE: 96.8 mg/dL

## 2017-02-15 LAB — COMPREHENSIVE METABOLIC PANEL
ALBUMIN: 3 g/dL — AB (ref 3.5–5.0)
ALK PHOS: 71 U/L (ref 38–126)
ALT: 18 U/L (ref 17–63)
ANION GAP: 6 (ref 5–15)
AST: 26 U/L (ref 15–41)
BILIRUBIN TOTAL: 0.6 mg/dL (ref 0.3–1.2)
BUN: 19 mg/dL (ref 6–20)
CALCIUM: 7.9 mg/dL — AB (ref 8.9–10.3)
CO2: 22 mmol/L (ref 22–32)
CREATININE: 1.27 mg/dL — AB (ref 0.61–1.24)
Chloride: 107 mmol/L (ref 101–111)
GFR calc Af Amer: 54 mL/min — ABNORMAL LOW (ref >60)
GFR calc non Af Amer: 47 mL/min — ABNORMAL LOW (ref >60)
Glucose, Bld: 95 mg/dL (ref 65–99)
Potassium: 3.8 mmol/L (ref 3.5–5.1)
SODIUM: 135 mmol/L (ref 135–145)
Total Protein: 6.3 g/dL — ABNORMAL LOW (ref 6.5–8.1)

## 2017-02-15 LAB — CBC WITH DIFFERENTIAL/PLATELET
BASOS PCT: 1 %
Basophils Absolute: 0.1 10*3/uL (ref 0.0–0.1)
EOS ABS: 0.2 10*3/uL (ref 0.0–0.7)
Eosinophils Relative: 2 %
HCT: 25.2 % — ABNORMAL LOW (ref 39.0–52.0)
HEMOGLOBIN: 8.2 g/dL — AB (ref 13.0–17.0)
LYMPHS ABS: 1.2 10*3/uL (ref 0.7–4.0)
LYMPHS PCT: 12 %
MCH: 26.9 pg (ref 26.0–34.0)
MCHC: 32.5 g/dL (ref 30.0–36.0)
MCV: 82.6 fL (ref 78.0–100.0)
Monocytes Absolute: 1.5 10*3/uL — ABNORMAL HIGH (ref 0.1–1.0)
Monocytes Relative: 15 %
NEUTROS ABS: 6.9 10*3/uL (ref 1.7–7.7)
Neutrophils Relative %: 70 %
Platelets: 331 10*3/uL (ref 150–400)
RBC: 3.05 MIL/uL — ABNORMAL LOW (ref 4.22–5.81)
RDW: 21.6 % — AB (ref 11.5–15.5)
WBC: 9.9 10*3/uL (ref 4.0–10.5)

## 2017-02-15 LAB — URINALYSIS, ROUTINE W REFLEX MICROSCOPIC
BACTERIA UA: NONE SEEN
BILIRUBIN URINE: NEGATIVE
Glucose, UA: NEGATIVE mg/dL
Hgb urine dipstick: NEGATIVE
KETONES UR: NEGATIVE mg/dL
Nitrite: NEGATIVE
PROTEIN: NEGATIVE mg/dL
Specific Gravity, Urine: 1.008 (ref 1.005–1.030)
pH: 6 (ref 5.0–8.0)

## 2017-02-15 LAB — CBG MONITORING, ED: Glucose-Capillary: 79 mg/dL (ref 65–99)

## 2017-02-15 LAB — STREP PNEUMONIAE URINARY ANTIGEN: STREP PNEUMO URINARY ANTIGEN: NEGATIVE

## 2017-02-15 LAB — GLUCOSE, CAPILLARY
GLUCOSE-CAPILLARY: 90 mg/dL (ref 65–99)
GLUCOSE-CAPILLARY: 86 mg/dL (ref 65–99)
GLUCOSE-CAPILLARY: 89 mg/dL (ref 65–99)

## 2017-02-15 LAB — PROCALCITONIN: PROCALCITONIN: 0.24 ng/mL

## 2017-02-15 MED ORDER — ACETAMINOPHEN 325 MG PO TABS
650.0000 mg | ORAL_TABLET | Freq: Once | ORAL | Status: AC
  Administered 2017-02-15: 650 mg via ORAL
  Filled 2017-02-15: qty 2

## 2017-02-15 MED ORDER — VITAMIN D3 25 MCG (1000 UNIT) PO TABS
1000.0000 [IU] | ORAL_TABLET | Freq: Every day | ORAL | Status: DC
  Administered 2017-02-15 – 2017-02-17 (×3): 1000 [IU] via ORAL
  Filled 2017-02-15 (×3): qty 1

## 2017-02-15 MED ORDER — OCUVITE-LUTEIN PO CAPS
1.0000 | ORAL_CAPSULE | Freq: Every day | ORAL | Status: DC
  Filled 2017-02-15 (×2): qty 1

## 2017-02-15 MED ORDER — ICAPS AREDS 2 PO CAPS: 1.0000 | ORAL_CAPSULE | Freq: Every day | ORAL | Status: DC

## 2017-02-15 MED ORDER — SODIUM CHLORIDE 0.9 % IV SOLN
INTRAVENOUS | Status: DC
  Administered 2017-02-15 – 2017-02-17 (×3): via INTRAVENOUS

## 2017-02-15 MED ORDER — LEVOFLOXACIN IN D5W 750 MG/150ML IV SOLN: 750.0000 mg | INTRAVENOUS | Status: DC

## 2017-02-15 MED ORDER — DEXTROSE 5 % IV SOLN
1.0000 g | Freq: Two times a day (BID) | INTRAVENOUS | Status: DC
  Administered 2017-02-15 – 2017-02-17 (×5): 1 g via INTRAVENOUS
  Filled 2017-02-15 (×5): qty 1

## 2017-02-15 MED ORDER — AMLODIPINE BESYLATE 5 MG PO TABS
5.0000 mg | ORAL_TABLET | Freq: Every day | ORAL | Status: DC
  Administered 2017-02-15 – 2017-02-16 (×2): 5 mg via ORAL
  Filled 2017-02-15 (×2): qty 1

## 2017-02-15 MED ORDER — GUAIFENESIN-DM 100-10 MG/5ML PO SYRP
5.0000 mL | ORAL_SOLUTION | ORAL | Status: DC | PRN
  Administered 2017-02-15 – 2017-02-17 (×2): 5 mL via ORAL
  Filled 2017-02-15 (×2): qty 10

## 2017-02-15 MED ORDER — HEPARIN SODIUM (PORCINE) 5000 UNIT/ML IJ SOLN
5000.0000 [IU] | Freq: Three times a day (TID) | INTRAMUSCULAR | Status: DC
  Administered 2017-02-15 – 2017-02-16 (×2): 5000 [IU] via SUBCUTANEOUS
  Filled 2017-02-15 (×4): qty 1

## 2017-02-15 MED ORDER — ATENOLOL 25 MG PO TABS
25.0000 mg | ORAL_TABLET | Freq: Every day | ORAL | Status: DC
  Administered 2017-02-15 – 2017-02-17 (×3): 25 mg via ORAL
  Filled 2017-02-15 (×3): qty 1

## 2017-02-15 MED ORDER — FINASTERIDE 5 MG PO TABS
5.0000 mg | ORAL_TABLET | Freq: Every morning | ORAL | Status: DC
  Administered 2017-02-15 – 2017-02-17 (×3): 5 mg via ORAL
  Filled 2017-02-15 (×3): qty 1

## 2017-02-15 MED ORDER — METHOCARBAMOL 500 MG PO TABS: 250.0000 mg | ORAL_TABLET | Freq: Four times a day (QID) | ORAL | Status: DC | PRN

## 2017-02-15 MED ORDER — ACETAMINOPHEN 325 MG PO TABS
650.0000 mg | ORAL_TABLET | Freq: Four times a day (QID) | ORAL | Status: DC | PRN
  Administered 2017-02-15 – 2017-02-17 (×5): 650 mg via ORAL
  Filled 2017-02-15 (×5): qty 2

## 2017-02-15 MED ORDER — INSULIN ASPART 100 UNIT/ML ~~LOC~~ SOLN
0.0000 [IU] | Freq: Three times a day (TID) | SUBCUTANEOUS | Status: DC
  Administered 2017-02-16 – 2017-02-17 (×2): 1 [IU] via SUBCUTANEOUS

## 2017-02-15 NOTE — Progress Notes (Signed)
Pharmacy Antibiotic Note  Alexander Barnett is a 84 y.o. male admitted on 02/15/2017 with HCAP vs recurrent/worsening CAP. Recently admitted for CAP and completed Tx with Rocephin/Zmax and Ceftin; discharged with O2. Pharmacy has been consulted for Cefepime dosing.   Plan:  Cefepime 1 g IV q12 hr  F/u SCr, Cx, and clinical course   Weight: 140 lb (63.5 kg)  Temp (24hrs), Avg:98.4 F (36.9 C), Min:98 F (36.7 C), Max:98.7 F (37.1 C)  Recent Labs  Lab 02/08/17 1606 02/10/17 0438 02/14/17 1026 02/15/17 0622  WBC 25.1* 13.0* 8.3 9.9  CREATININE 1.46*  --   --  1.27*    Estimated Creatinine Clearance: 31.9 mL/min (A) (by C-G formula based on SCr of 1.27 mg/dL (H)).    Allergies  Allergen Reactions  . Amoxicillin Diarrhea    Has patient had a PCN reaction causing immediate rash, facial/tongue/throat swelling, SOB or lightheadedness with hypotension: no Has patient had a PCN reaction causing severe rash involving mucus membranes or skin necrosis: no Has patient had a PCN reaction that required hospitalization: unknown Has patient had a PCN reaction occurring within the last 10 years: no If all of the above answers are "NO", then may proceed with Cephalosporin use.  . Ciprofloxacin Other (See Comments)    Muscle strain in groin     Thank you for allowing pharmacy to be a part of this patient's care.  Reuel Boom, PharmD, BCPS [phone] 02/15/2017, 9:31 AM

## 2017-02-15 NOTE — Clinical Social Work Note (Signed)
Clinical Social Work Assessment  Patient Details  Name: Alexander Barnett MRN: [id number] Date of Birth: 1920/11/25  Date of referral:  02/15/17               Reason for consult:  Facility Placement                Permission sought to share information with:    Permission granted to share information::     Name::        Agency::     Relationship::     Contact Information:     Housing/Transportation Living arrangements for the past 2 months:  Apartment(Town Home) Source of Information:  Patient Patient Interpreter Needed:  None Criminal Activity/Legal Involvement Pertinent to Current Situation/Hospitalization:    Significant Relationships:  None Lives with:  Self Do you feel safe going back to the place where you live?    Need for family participation in patient care:     Care giving concerns:  Pt comes from home via EMS with complaints of an unwitnessed fall after he tripped over a chair. Pt was just discharged with pneumonia from hospital on 02/08/17.    Social Worker assessment / plan:  CSW intern met with patient via bedside to discuss current living arrangements. Patient informed Probation officer that he currently lives in a two story town home alone. Patient continued saying the town home is becoming too much for him to handle on his own and has recently been seeking new housing. Patient told Probation officer he has already visited [facility] and [facility] assisted living facilities. Patient has also looked into [facility] in [place].  Patient stated that his finances are sufficient for these facilities he has already investigated. Patient told Probation officer that his wife passed away roughly ten years ago, and that initiated him to get a living will prepared. Patient expressed concerns about what questions he should be asking these facilities, and what services is he getting for his money. CSW intern assured patient that another social worker would be speaking with him once he got moved to a bed  upstairs to discuss any further questions/ discharge planning.   Employment status:  Retired Nurse, adult PT Recommendations:  Home with [place] / Referral to community resources:  [place]  Patient/Family's Response to care:  Patient was accepting to care, and very proactive about his living arrangements and health once discharged.  Patient/Family's Understanding of and Emotional Response to Diagnosis, Current Treatment, and Prognosis:  Patient is understanding of current diagnosis and treatment- being admitted.   Emotional Assessment Appearance:  Appears stated age Attitude/Demeanor/Rapport:    Affect (typically observed):  Pleasant, Accepting, Calm Orientation:  Oriented to Self, Oriented to Place, Oriented to  Time, Oriented to Situation Alcohol / Substance use:    Psych involvement (Current and /or in the community):  No (Comment)  Discharge Needs  Concerns to be addressed:  No discharge needs identified Readmission within the last 30 days:  Yes Current discharge risk:  None Barriers to Discharge:  No Barriers Identified   Willeen Niece, Student-Social Work 02/15/2017, 9:13 AM

## 2017-02-15 NOTE — ED Notes (Signed)
Patient reports that he will wait until medication (Tylenlol) is affective before walking.

## 2017-02-15 NOTE — ED Triage Notes (Signed)
Pt comes from home via EMS with complaints of an unwitnessed fall after he tripped over a chair.  Pt lives at home by himself.  Complains of lower back pain that worsens with movement.  A&O x4. Ambulatory device noted in home but unsure if patient uses it regularly.  Pt was just discharged with pneumonia from hospital on 02/08/17. Vitals stable in route.

## 2017-02-15 NOTE — ED Notes (Signed)
Bed: WA28 Expected date:  Expected time:  Means of arrival:  Comments: 

## 2017-02-15 NOTE — ED Provider Notes (Addendum)
[facility] DEPT Provider Note   CSN: [id number] Arrival date & time: 02/15/17  0044  Time seen 01:25 AM   History   Chief Complaint Chief Complaint  Patient presents with  . Fall    HPI Alexander Barnett is a 84 y.o. male.  HPI patient states tonight he got up out of sleeping in his chair in the living room.  He states he normally uses a walker and when he reached for the walker he "started walking backwards" and hit a soft padded chair.  He states he fell onto the soft padded chair and then he just sort of "collapsed" to the floor.  He denies falling and hitting the floor.  He did not hit his head.  He states he is sleeping in the chair because he has back pain.  He states he is having back pain now.  He is not sure if it is worse or not.  He states he "strained my back" several weeks ago.  He complains of soreness in the muscles of his bilateral back.  He describes his back pain is a dull ache.  He states he takes Tylenol which helps.  Patient was recently discharged from the hospital for pneumonia.  He states that his pneumonia is better, he is not having fever anymore and his coughing is getting better.  He denies neck pain.  When asked what he needs to check tonight he states "I think I need an x-ray" however he told me he did not think his pain was worse.  PCP Jonathon Jordan, MD   Past Medical History:  Diagnosis Date  . Anemia   . Aortic stenosis    moderate by echo 12/2015  . Arthritis    Fingers  . BPH (benign prostatic hyperplasia)   . Bradycardia   . CKD (chronic kidney disease) stage 3, GFR 30-59 ml/min (HCC)   . Diabetes mellitus without complication ([place])    prediabetes diet controlled  . Diastolic dysfunction   . Diverticulosis of colon   . Erectile dysfunction   . Hematuria   . History of kidney cancer   . History of skin cancer   . Hx of transfusion   . Hypertension   . Persistent atrial fibrillation ([place]) 06/2006   refused  anticoagulation  . Prostate cancer ([place])   . Pulmonary HTN ([place]) 12/23/2015   Moderate with PASP 41mmHg  . 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 Malnutrition of moderate degree 02/09/2017  . RSV (respiratory syncytial virus pneumonia) 02/09/2017  . Acute hypoxemic respiratory failure ([place]) 02/09/2017  . DM type 2 (diabetes mellitus, type 2) ([place]) 02/09/2017  . Aortic atherosclerosis ([place]) 02/09/2017  . CAP (community acquired pneumonia) 02/08/2017  . MDS (myelodysplastic syndrome), low grade ([place]) 09/27/2016  . CKD (chronic kidney disease) 09/27/2016  . Pulmonary HTN ([place]) 12/23/2015  . Hematuria, gross 03/26/2014  . Bradycardia   . CKD (chronic kidney disease) stage 3, GFR 30-59 ml/min (HCC)   . Aortic stenosis   . Diastolic dysfunction   . Hypertension   . Arthritis   . History of skin cancer   . Hematuria   . Anemia   . Hx of transfusion   . History of kidney cancer   . Persistent atrial fibrillation ([place]) 06/17/2006    Past Surgical History:  Procedure Laterality Date  . CYSTOSCOPY N/A 06/24/2012   Procedure:  CYSTOSCOPY;  Surgeon: Dutch Gray, MD;  Location: [facility];  Service: Urology;  Laterality: N/A;  . CYSTOSCOPY N/A 08/05/2012   Procedure: CYSTOSCOPY;  Surgeon: Dutch Gray, MD;  Location: [facility];  Service: Urology;  Laterality: N/A;  CYSTO, CLOT EVACUATION, FULGERATION OF BLEEDING AREAS AT BLADDER NECK AND PROSTATIC URETHRA    . EYE SURGERY  02/2004   bil.cataract w/ lens implant  . HERNIA REPAIR  05/2002   right  . hyperbaric treatments     40 treatments for prostate-to try to stop bleeding  . imrt radiation  04/2006   for prostate cancer  . MOHS SURGERY  02/2002   nose  . NEPHRECTOMY  03/2007   left  . PROSTATE SURGERY  12/2003  . PROSTATE SURGERY  03/1998   surgery in prostate area  . targus  04/1998   to prostate  . TONSILLECTOMY     as child  . TRANSURETHRAL  RESECTION OF BLADDER TUMOR N/A 03/29/2014   Procedure: CYSTOSCOPY, CLOT EVACUATION, RIGHT RETROGRADE, TRANSURETHRAL RESECTION OF BLADDER TUMOR (TURBT);  Surgeon: Ardis Hughs, MD;  Location: [facility];  Service: Urology;  Laterality: N/A;  . TRANSURETHRAL RESECTION OF PROSTATE N/A 06/24/2012   Procedure: TRANSURETHRAL RESECTION OF THE PROSTATE (TURP);  Surgeon: Dutch Gray, MD;  Location: [facility];  Service: Urology;  Laterality: N/A;  CYSTOSCOPY, TRANSURETHRAL RESECTION OF PROSTATIC URETHRAL TUMOR   . URINARY SURGERY  03/1998   bleeding       Home Medications    Prior to Admission medications   Medication Sig Start Date End Date Taking? Authorizing Provider  amLODipine (NORVASC) 5 MG tablet Take 1 tablet (5 mg total) by mouth daily. Please keep upcoming appt for future refills. Thank you 02/13/17  Yes Turner, Eber Hong, MD  atenolol (TENORMIN) 25 MG tablet Take 1 tablet (25 mg total) by mouth daily. Patient needs to keep 03/02/17 appointment for further refills 01/30/17  Yes Turner, Eber Hong, MD  cefUROXime (CEFTIN) 500 MG tablet Take 1 tablet (500 mg total) by mouth at bedtime for 3 days. 02/12/17 02/15/17 Yes Samuella Cota, MD  cholecalciferol (VITAMIN D) 1000 UNITS tablet Take 1,000 Units by mouth daily.   Yes [provider]  Coenzyme Q10 (CO Q-10) 100 MG CAPS Take 1 capsule by mouth daily.    Yes [provider]  diphenoxylate-atropine (LOMOTIL) 2.5-0.025 MG tablet Take 1 tablet by mouth daily as needed for diarrhea or loose stools. 09/22/16  Yes [provider]  finasteride (PROSCAR) 5 MG tablet Take 5 mg by mouth every morning.    Yes [provider]  losartan (COZAAR) 100 MG tablet Take 1 tablet (100 mg total) by mouth daily. Please keep upcoming appt with Dr.Turner in December. Thanks 12/11/16  Yes Turner, Eber Hong, MD  Multiple Vitamin (MULTIVITAMIN WITH MINERALS) TABS Take 1 tablet by mouth daily.   Yes [provider]  Multiple  Vitamins-Minerals (ICAPS AREDS 2) CAPS Take 1 capsule by mouth daily.   Yes [provider]  travoprost, benzalkonium, (TRAVATAN) 0.004 % ophthalmic solution Place 1 drop into both eyes at bedtime.   Yes [provider]    Family History No family history on file.  Social History Social History   Tobacco Use  . Smoking status: Former Smoker    Packs/day: 1.50    Years: 20.00    Pack years: 30.00    Types: Cigarettes    Last attempt to quit: 06/21/1962    Years since quitting: 54.6  .  Smokeless tobacco: Never Used  Substance Use Topics  . Alcohol use: Yes    Alcohol/week: 2.4 oz    Types: 4 Glasses of wine per week    Comment: glass wine  occassionally  . Drug use: No  lives at home Lives alone Uses a walker   Allergies   Amoxicillin and Ciprofloxacin   Review of Systems Review of Systems  All other systems reviewed and are negative.    Physical Exam Updated Vital Signs BP 132/70 (BP Location: Right Arm)   Pulse 70   Temp 98 F (36.7 C) (Oral)   Resp 18   Wt 63.5 kg (140 lb)   SpO2 95%   BMI 22.60 kg/m   Vital signs normal    Physical Exam  Constitutional: He is oriented to person, place, and time. He appears well-developed and well-nourished.  Non-toxic appearance. He does not appear ill. No distress.  Patient did not wear his hearing aids  HENT:  Head: Normocephalic and atraumatic.  Right Ear: External ear normal.  Left Ear: External ear normal.  Nose: Nose normal. No mucosal edema or rhinorrhea.  Mouth/Throat: Oropharynx is clear and moist and mucous membranes are normal. No dental abscesses or uvula swelling.  Eyes: Conjunctivae and EOM are normal. Pupils are equal, round, and reactive to light.  Neck: Normal range of motion and full passive range of motion without pain. Neck supple.  Cardiovascular: Normal rate and regular rhythm. Exam reveals no gallop and no friction rub.  Murmur heard.  Systolic murmur is  present. Pulmonary/Chest: Effort normal and breath sounds normal. No respiratory distress. He has no wheezes. He has no rhonchi. He has no rales. He exhibits no tenderness and no crepitus.  Abdominal: Soft. Normal appearance and bowel sounds are normal. He exhibits no distension. There is no tenderness. There is no rebound and no guarding.  Musculoskeletal: Normal range of motion. He exhibits no edema or tenderness.  Moves all extremities well.  Patient's lumbar spine is totally nontender to palpation, he has minimal discomfort to palpating his paraspinous muscles.  Neurological: He is alert and oriented to person, place, and time. He has normal strength. No cranial nerve deficit.  Skin: Skin is warm, dry and intact. No rash noted. No erythema. No pallor.  Psychiatric: He has a normal mood and affect. His speech is normal and behavior is normal. His mood appears not anxious.  Nursing note and vitals reviewed.    ED Treatments / Results  Labs (all labs ordered are listed, but only abnormal results are displayed)  Results for orders placed or performed during the hospital encounter of 02/15/17  Comprehensive metabolic panel  Result Value Ref Range   Sodium 135 135 - 145 mmol/L   Potassium 3.8 3.5 - 5.1 mmol/L   Chloride 107 101 - 111 mmol/L   CO2 22 22 - 32 mmol/L   Glucose, Bld 95 65 - 99 mg/dL   BUN 19 6 - 20 mg/dL   Creatinine, Ser 1.27 (H) 0.61 - 1.24 mg/dL   Calcium 7.9 (L) 8.9 - 10.3 mg/dL   Total Protein 6.3 (L) 6.5 - 8.1 g/dL   Albumin 3.0 (L) 3.5 - 5.0 g/dL   AST 26 15 - 41 U/L   ALT 18 17 - 63 U/L   Alkaline Phosphatase 71 38 - 126 U/L   Total Bilirubin 0.6 0.3 - 1.2 mg/dL   GFR calc non Af Amer 47 (L) >60 mL/min   GFR calc Af Amer 54 (L) >60  mL/min   Anion gap 6 5 - 15  CBC with Differential  Result Value Ref Range   WBC 9.9 4.0 - 10.5 K/uL   RBC 3.05 (L) 4.22 - 5.81 MIL/uL   Hemoglobin 8.2 (L) 13.0 - 17.0 g/dL   HCT 25.2 (L) 39.0 - 52.0 %   MCV 82.6 78.0 - 100.0 fL    MCH 26.9 26.0 - 34.0 pg   MCHC 32.5 30.0 - 36.0 g/dL   RDW 21.6 (H) 11.5 - 15.5 %   Platelets 331 150 - 400 K/uL   Neutrophils Relative % 70 %   Lymphocytes Relative 12 %   Monocytes Relative 15 %   Eosinophils Relative 2 %   Basophils Relative 1 %   Neutro Abs 6.9 1.7 - 7.7 K/uL   Lymphs Abs 1.2 0.7 - 4.0 K/uL   Monocytes Absolute 1.5 (H) 0.1 - 1.0 K/uL   Eosinophils Absolute 0.2 0.0 - 0.7 K/uL   Basophils Absolute 0.1 0.0 - 0.1 K/uL   RBC Morphology POLYCHROMASIA PRESENT    Laboratory interpretation all normal except except stable renal insufficiency, stable anemia  EKG  EKG Interpretation None       Radiology  Dg Chest 2 View  Result Date: 02/15/2017 CLINICAL DATA:  Unwitnessed fall at home last night complains of mid low back pain, worse with movement, recent hx of pneumonia with ongoing cough, congestion, sob EXAM: CHEST  2 VIEW COMPARISON:  02/08/2017 FINDINGS: Retrocardiac airspace opacity, stable. Increase in airspace opacity in the right middle lobe. Increase in indistinctness of the upper zone pulmonary vasculature. Atherosclerotic calcification of the aortic arch. Borderline enlargement of the cardiopericardial silhouette. Blunting of both costophrenic angles. Fullness of the right infrahilar region. IMPRESSION: 1. Right middle lobe and left lower lobe airspace opacities. Multilobar pneumonia not excluded. 2. Right infrahilar fullness could be from adenopathy or an infrahilar lesion. Consider chest CT. 3. Borderline enlargement of the cardiopericardial silhouette with cephalization of blood flow compatible with pulmonary venous hypertension. 4.  Aortic Atherosclerosis (ICD10-I70.0). Electronically Signed   By: Van Clines M.D.   On: 02/15/2017 08:14       Dg Lumbar Spine Complete  Result Date: 02/15/2017 CLINICAL DATA:  84 y/o  M; fall onto back with lower back pain. EXAM: LUMBAR SPINE - COMPLETE 4+ VIEW COMPARISON:  03/10/2016 CT abdomen and pelvis. FINDINGS:  Abdominal aortic calcific atherosclerosis. Mild lumbar levocurvature with apex at L3. Mild interval loss of height of L1 vertebral body. Moderate L2-3 and severe L3-L5 loss of intervertebral disc space height. Normal lumbar lordosis without listhesis. Lower lumbar facet arthrosis. IMPRESSION: 1. Mild interval loss of height of L1 vertebral body may represent acute compression deformity. 2. Stable lumbar spine spondylosis. 3. Aortic atherosclerosis. Electronically Signed   By: Kristine Garbe M.D.   On: 02/15/2017 02:29    Procedures Procedures (including critical care time)  Medications Ordered in ED Medications  acetaminophen (TYLENOL) tablet 650 mg (650 mg Oral Given 02/15/17 0311)     Initial Impression / Assessment and Plan / ED Course  I have reviewed the triage vital signs and the nursing notes.  Pertinent labs & imaging results that were available during my care of the patient were reviewed by me and considered in my medical decision making (see chart for details).     Patient asked for pain medication he was given Tylenol.  I will have nursing staff see if he can stand up in the room.  When I examined him he had  absolutely no pain when I palpated his midline lumbar spine, so I am not sure about this abnormality of L1.  Nursing staff states patient was able to ambulate however he did require a lot of assistance.  Patient normally uses a walker at home.  When I rechecked the patient at 5:30 AM he states he feels like he cannot go home.  He states the home health nurses do not start until next week, he is also at this time states he supposed to be on oxygen, when I put him on the pulse ox his Oxygen was 91%.  He was placed on 2 L nasal cannula.  At this point laboratory testing was done, I will have social work consult on patient to see if he needs placement for rehab.  At this point he does not have any reason to admit to the hospital medically.  A chest x-ray was done just to  make sure his pneumonia was not worse however clinically he states it is improved.  08:00 AM Patient was turned over to Dr. Vanita Panda at change of shift to see what the social workers assessment is.  8:20 AM patient's chest x-ray shows some increased evidence of progression of his pneumonia, clinically he seemed improved.  Will talk to the hospitalist to see if he needs to be readmitted.  8:30 AM Dr. Allyson Sabal, hospitalist will admit.  Patient was discharged home on Ceftin.  I will let the hospitalist decide what antibiotic to put him on now.   Final Clinical Impressions(s) / ED Diagnoses   Final diagnoses:  Fall in home, initial encounter  Midline low back pain without sciatica, unspecified chronicity  Community acquired pneumonia, unspecified laterality    Plan admission  Rolland Porter, MD, [place], MD 02/15/17 [place], [place], MD 02/15/17 [phone]

## 2017-02-15 NOTE — H&P (Addendum)
[facility] History and Physical  Alexander Barnett MPN:[id number] DOB: Sep 07, 1920 DOA: 02/15/2017  Referring physician:   PCP: Jonathon Jordan, MD   Chief Complaint:  Fall, back pain  HPI:   84 year old male with a history of type 2 diabetes, normocytic anemia, aortic stenosis, chronic kidney disease, moderate malnutrition, who was recently admitted and discharged between 1/24-1/28 after being diagnosed with RSV/community-acquired pneumonia. Patient was discharged in 2-3 L of oxygen continuously for short-term use . Also discharged on cefuroxime after being treated with ceftriaxone and azithromycin.2-D echo showed worsening aortic stenosis with moderate to severe findings . Patient was discharged home 1/28  with home health. Patient presents today 1/31  after fall, after he tripped over a chair. He lives by himself. Patient normally uses a walker at baseline. Today he fell on the floor and collapsed. He presented with back pain to the ED today. Patient has taken Tylenol which seems to have helped a little. Patient continues to cough. Does not report any fever. ED course BP 132/70 (BP Location: Right Arm)   Pulse 70   Temp 98 F (36.7 C) (Oral)   Resp 18   Wt 63.5 kg (140 lb)   SpO2 95%   BMI 22.60 kg/m  Patient found to have right middle lobe and left lower lobe opacity, multi lobar pneumonia not excluded. Recommendation is to consider CT of the chest to further evaluate adenopathy in the infrahilar region. Lumbar spine x-ray shows interval loss of L1 vertebrae body. Patient unable to ambulate and requiring a lot of assistance. Therefore patient is being admitted for pneumonia as well as pain control.      Review of Systems: negative for the following   complete review of systems was done with pertinent positives documented in history of present illness   Past Medical History:  Diagnosis Date  . Anemia   . Aortic stenosis    moderate by echo 12/2015  . Arthritis    Fingers   . BPH (benign prostatic hyperplasia)   . Bradycardia   . CKD (chronic kidney disease) stage 3, GFR 30-59 ml/min (HCC)   . Diabetes mellitus without complication ([place])    prediabetes diet controlled  . Diastolic dysfunction   . Diverticulosis of colon   . Erectile dysfunction   . Hematuria   . History of kidney cancer   . History of skin cancer   . Hx of transfusion   . Hypertension   . Persistent atrial fibrillation ([place]) 06/2006   refused anticoagulation  . Prostate cancer ([place])   . Pulmonary HTN ([place]) 12/23/2015   Moderate with PASP 106mmHg  . Transitional cell carcinoma of bladder (HCC)    and renal pelvis     Past Surgical History:  Procedure Laterality Date  . CYSTOSCOPY N/A 06/24/2012   Procedure: CYSTOSCOPY;  Surgeon: Dutch Gray, MD;  Location: [facility];  Service: Urology;  Laterality: N/A;  . CYSTOSCOPY N/A 08/05/2012   Procedure: CYSTOSCOPY;  Surgeon: Dutch Gray, MD;  Location: [facility];  Service: Urology;  Laterality: N/A;  CYSTO, CLOT EVACUATION, FULGERATION OF BLEEDING AREAS AT BLADDER NECK AND PROSTATIC URETHRA    . EYE SURGERY  02/2004   bil.cataract w/ lens implant  . HERNIA REPAIR  05/2002   right  . hyperbaric treatments     40 treatments for prostate-to try to stop bleeding  . imrt radiation  04/2006   for prostate cancer  . MOHS SURGERY  02/2002   nose  . NEPHRECTOMY  03/2007  left  . PROSTATE SURGERY  12/2003  . PROSTATE SURGERY  03/1998   surgery in prostate area  . targus  04/1998   to prostate  . TONSILLECTOMY     as child  . TRANSURETHRAL RESECTION OF BLADDER TUMOR N/A 03/29/2014   Procedure: CYSTOSCOPY, CLOT EVACUATION, RIGHT RETROGRADE, TRANSURETHRAL RESECTION OF BLADDER TUMOR (TURBT);  Surgeon: Ardis Hughs, MD;  Location: [facility];  Service: Urology;  Laterality: N/A;  . TRANSURETHRAL RESECTION OF PROSTATE N/A 06/24/2012   Procedure: TRANSURETHRAL RESECTION OF THE PROSTATE (TURP);  Surgeon: Dutch Gray, MD;  Location: [facility];  Service:  Urology;  Laterality: N/A;  CYSTOSCOPY, TRANSURETHRAL RESECTION OF PROSTATIC URETHRAL TUMOR   . URINARY SURGERY  03/1998   bleeding      Social History:  reports that he quit smoking about 54 years ago. His smoking use included cigarettes. He has a 30.00 pack-year smoking history. he has never used smokeless tobacco. He reports that he drinks about 2.4 oz of alcohol per week. He reports that he does not use drugs.    Allergies  Allergen Reactions  . Amoxicillin Diarrhea    Has patient had a PCN reaction causing immediate rash, facial/tongue/throat swelling, SOB or lightheadedness with hypotension: no Has patient had a PCN reaction causing severe rash involving mucus membranes or skin necrosis: no Has patient had a PCN reaction that required hospitalization: unknown Has patient had a PCN reaction occurring within the last 10 years: no If all of the above answers are "NO", then may proceed with Cephalosporin use.  . Ciprofloxacin Other (See Comments)    Muscle strain in groin        FAMILY HISTORY  When questioned  Directly-patient reports  No family history of HTN, CVA ,DIABETES, TB, Cancer CAD, Bleeding Disorders, Sickle Cell, diabetes, anemia, asthma,   Prior to Admission medications   Medication Sig Start Date End Date Taking? Authorizing Provider  amLODipine (NORVASC) 5 MG tablet Take 1 tablet (5 mg total) by mouth daily. Please keep upcoming appt for future refills. Thank you 02/13/17  Yes Turner, Eber Hong, MD  atenolol (TENORMIN) 25 MG tablet Take 1 tablet (25 mg total) by mouth daily. Patient needs to keep 03/02/17 appointment for further refills 01/30/17  Yes Turner, Eber Hong, MD  cefUROXime (CEFTIN) 500 MG tablet Take 1 tablet (500 mg total) by mouth at bedtime for 3 days. 02/12/17 02/15/17 Yes Samuella Cota, MD  cholecalciferol (VITAMIN D) 1000 UNITS tablet Take 1,000 Units by mouth daily.   Yes [provider]  Coenzyme Q10 (CO Q-10) 100 MG CAPS Take 1 capsule by  mouth daily.    Yes [provider]  diphenoxylate-atropine (LOMOTIL) 2.5-0.025 MG tablet Take 1 tablet by mouth daily as needed for diarrhea or loose stools. 09/22/16  Yes [provider]  finasteride (PROSCAR) 5 MG tablet Take 5 mg by mouth every morning.    Yes [provider]  losartan (COZAAR) 100 MG tablet Take 1 tablet (100 mg total) by mouth daily. Please keep upcoming appt with Dr.Turner in December. Thanks 12/11/16  Yes Turner, Eber Hong, MD  Multiple Vitamin (MULTIVITAMIN WITH MINERALS) TABS Take 1 tablet by mouth daily.   Yes [provider]  Multiple Vitamins-Minerals (ICAPS AREDS 2) CAPS Take 1 capsule by mouth daily.   Yes [provider]  travoprost, benzalkonium, (TRAVATAN) 0.004 % ophthalmic solution Place 1 drop into both eyes at bedtime.   Yes [provider]     Physical Exam:  Vitals:   02/15/17 0053 02/15/17 0100 02/15/17 0313 02/15/17 0515  BP: (!) 145/76  132/70 132/63  Pulse: 72  70 83  Resp: 19  18 12   Temp: 98.7 F (37.1 C)  98 F (36.7 C)   TempSrc: Oral  Oral   SpO2: 94% 93% 95% 90%  Weight:            Vitals:   02/15/17 0053 02/15/17 0100 02/15/17 0313 02/15/17 0515  BP: (!) 145/76  132/70 132/63  Pulse: 72  70 83  Resp: 19  18 12   Temp: 98.7 F (37.1 C)  98 F (36.7 C)   TempSrc: Oral  Oral   SpO2: 94% 93% 95% 90%  Weight:       Constitutional: NAD, calm, comfortable Eyes: PERRL, lids and conjunctivae normal ENMT: Mucous membranes are moist. Posterior pharynx clear of any exudate or lesions.Normal dentition.  Neck: normal, supple, no masses, no thyromegaly Respiratory: clear to auscultation bilaterally, no wheezing, no crackles. Normal respiratory effort. No accessory muscle use.  Cardiovascular: Normal rate and regular rhythm. Exam reveals no gallop and no friction rub.  Murmur heard.  Systolic murmur is present.  Abdomen: no tenderness, no masses palpated. No hepatosplenomegaly. Bowel  sounds positive.  Musculoskeletal: no clubbing / cyanosis. No joint deformity upper and lower extremities. Good ROM, no contractures. Normal muscle tone.  Skin: no rashes, lesions, ulcers. No induration Neurologic: CN 2-12 grossly intact. Sensation intact, DTR normal. Strength 5/5 in all 4.  Psychiatric: Normal judgment and insight. Alert and oriented x 3. Normal mood.     Labs on Admission: I have personally reviewed following labs and imaging studies  CBC: Recent Labs  Lab 02/08/17 1606 02/10/17 0438 02/14/17 1026 02/15/17 0622  WBC 25.1* 13.0* 8.3 9.9  NEUTROABS 21.8*  --  6.1 6.9  HGB 8.7* 8.0*  --  8.2*  HCT 27.3* 24.3* 27.4* 25.2*  MCV 82.0 81.5 83.3 82.6  PLT 198 161 322 353    Basic Metabolic Panel: Recent Labs  Lab 02/08/17 1606 02/15/17 0622  NA 136 135  K 4.2 3.8  CL 104 107  CO2 24 22  GLUCOSE 120* 95  BUN 29* 19  CREATININE 1.46* 1.27*  CALCIUM 9.2 7.9*    GFR: Estimated Creatinine Clearance: 31.9 mL/min (A) (by C-G formula based on SCr of 1.27 mg/dL (H)).  Liver Function Tests: Recent Labs  Lab 02/15/17 0622  AST 26  ALT 18  ALKPHOS 71  BILITOT 0.6  PROT 6.3*  ALBUMIN 3.0*   No results for input(s): LIPASE, AMYLASE in the last 168 hours. No results for input(s): AMMONIA in the last 168 hours.  Coagulation Profile: No results for input(s): INR, PROTIME in the last 168 hours. No results for input(s): DDIMER in the last 72 hours.  Cardiac Enzymes: No results for input(s): CKTOTAL, CKMB, CKMBINDEX, TROPONINI in the last 168 hours.  BNP (last 3 results) No results for input(s): PROBNP in the last 8760 hours.  HbA1C: No results for input(s): HGBA1C in the last 72 hours. Lab Results  Component Value Date   HGBA1C  07/11/2006    6.1 (NOTE)   The ADA recommends the following therapeutic goals for glycemic   control related to Hgb A1C measurement:   Goal of Therapy:   < 7.0% Hgb A1C   Action Suggested:  > 8.0% Hgb A1C   Ref:  Diabetes Care,  22, Suppl. 1, 1999     CBG: Recent Labs  Lab 02/11/17 1139 02/11/17  [place] 02/11/17 2047 02/12/17 0732 02/12/17 1144  GLUCAP 98 141* 105* 104* 101*    Lipid Profile: No results for input(s): CHOL, HDL, LDLCALC, TRIG, CHOLHDL, LDLDIRECT in the last 72 hours.  Thyroid Function Tests: No results for input(s): TSH, T4TOTAL, FREET4, T3FREE, THYROIDAB in the last 72 hours.  Anemia Panel: Recent Labs    02/14/17 1026  RETICCTPCT 3.2*    Urine analysis:    Component Value Date/Time   COLORURINE YELLOW 02/08/2017 1636   APPEARANCEUR HAZY (A) 02/08/2017 1636   LABSPEC 1.017 02/08/2017 1636   PHURINE 8.0 02/08/2017 1636   GLUCOSEU NEGATIVE 02/08/2017 1636   HGBUR NEGATIVE 02/08/2017 1636   BILIRUBINUR NEGATIVE 02/08/2017 1636   KETONESUR NEGATIVE 02/08/2017 1636   PROTEINUR 100 (A) 02/08/2017 1636   UROBILINOGEN 0.2 07/14/2012 1127   NITRITE NEGATIVE 02/08/2017 1636   LEUKOCYTESUR TRACE (A) 02/08/2017 1636    Sepsis Labs: @LABRCNTIP (procalcitonin:4,lacticidven:4) ) Recent Results (from the past 240 hour(s))  Culture, blood (routine x 2) Call MD if unable to obtain prior to antibiotics being given     Status: None   Collection Time: 02/08/17  8:50 PM  Result Value Ref Range Status   Specimen Description BLOOD LEFT ANTECUBITAL  Final   Special Requests   Final    BOTTLES DRAWN AEROBIC AND ANAEROBIC Blood Culture adequate volume   Culture   Final    NO GROWTH 5 DAYS Performed at [facility], [place] [address], [place], [place] [address]    Report Status 02/13/2017 FINAL  Final  Respiratory Panel by PCR     Status: Abnormal   Collection Time: 02/08/17  8:53 PM  Result Value Ref Range Status   Adenovirus NOT DETECTED NOT DETECTED Final   Coronavirus 229E NOT DETECTED NOT DETECTED Final   Coronavirus HKU1 NOT DETECTED NOT DETECTED Final   Coronavirus NL63 NOT DETECTED NOT DETECTED Final   Coronavirus OC43 NOT DETECTED NOT DETECTED Final   Metapneumovirus NOT  DETECTED NOT DETECTED Final   Rhinovirus / Enterovirus NOT DETECTED NOT DETECTED Final   Influenza A NOT DETECTED NOT DETECTED Final   Influenza B NOT DETECTED NOT DETECTED Final   Parainfluenza Virus 1 NOT DETECTED NOT DETECTED Final   Parainfluenza Virus 2 NOT DETECTED NOT DETECTED Final   Parainfluenza Virus 3 NOT DETECTED NOT DETECTED Final   Parainfluenza Virus 4 NOT DETECTED NOT DETECTED Final   Respiratory Syncytial Virus DETECTED (A) NOT DETECTED Final    Comment: CRITICAL RESULT CALLED TO, READ BACK BY AND VERIFIED WITH: B HODGES RN 02/09/17 0554A JDW    Bordetella pertussis NOT DETECTED NOT DETECTED Final   Chlamydophila pneumoniae NOT DETECTED NOT DETECTED Final   Mycoplasma pneumoniae NOT DETECTED NOT DETECTED Final    Comment: Performed at [facility], [place] [address], [place], [place] [address]  Culture, blood (routine x 2) Call MD if unable to obtain prior to antibiotics being given     Status: None   Collection Time: 02/08/17  9:02 PM  Result Value Ref Range Status   Specimen Description BLOOD RIGHT ANTECUBITAL  Final   Special Requests   Final    BOTTLES DRAWN AEROBIC AND ANAEROBIC Blood Culture adequate volume   Culture   Final    NO GROWTH 5 DAYS Performed at [facility], [place] [address] [place], [place] [address]    Report Status 02/13/2017 FINAL  Final  Culture, sputum-assessment     Status: None   Collection Time: 02/09/17  5:34 AM  Result Value Ref Range Status   Specimen Description SPUTUM  Final   Special Requests Normal  Final   Sputum evaluation THIS SPECIMEN IS ACCEPTABLE FOR SPUTUM CULTURE  Final   Report Status 02/09/2017 FINAL  Final  Culture, respiratory (NON-Expectorated)     Status: None   Collection Time: 02/09/17  5:34 AM  Result Value Ref Range Status   Specimen Description SPUTUM  Final   Special Requests Normal Reflexed from [id number]  Final   Gram Stain   Final    ABUNDANT WBC PRESENT, PREDOMINANTLY PMN RARE SQUAMOUS EPITHELIAL  CELLS PRESENT FEW GRAM POSITIVE COCCI IN CHAINS RARE GRAM NEGATIVE RODS RARE GRAM POSITIVE RODS    Culture   Final    FEW Consistent with normal respiratory flora. Performed at [facility], [place] [address], [place], [place] [address]    Report Status 02/11/2017 FINAL  Final         Radiological Exams on Admission: Dg Chest 2 View  Result Date: 02/15/2017 CLINICAL DATA:  Unwitnessed fall at home last night complains of mid low back pain, worse with movement, recent hx of pneumonia with ongoing cough, congestion, sob EXAM: CHEST  2 VIEW COMPARISON:  02/08/2017 FINDINGS: Retrocardiac airspace opacity, stable. Increase in airspace opacity in the right middle lobe. Increase in indistinctness of the upper zone pulmonary vasculature. Atherosclerotic calcification of the aortic arch. Borderline enlargement of the cardiopericardial silhouette. Blunting of both costophrenic angles. Fullness of the right infrahilar region. IMPRESSION: 1. Right middle lobe and left lower lobe airspace opacities. Multilobar pneumonia not excluded. 2. Right infrahilar fullness could be from adenopathy or an infrahilar lesion. Consider chest CT. 3. Borderline enlargement of the cardiopericardial silhouette with cephalization of blood flow compatible with pulmonary venous hypertension. 4.  Aortic Atherosclerosis (ICD10-I70.0). Electronically Signed   By: Van Clines M.D.   On: 02/15/2017 08:14   Dg Lumbar Spine Complete  Result Date: 02/15/2017 CLINICAL DATA:  84 y/o  M; fall onto back with lower back pain. EXAM: LUMBAR SPINE - COMPLETE 4+ VIEW COMPARISON:  03/10/2016 CT abdomen and pelvis. FINDINGS: Abdominal aortic calcific atherosclerosis. Mild lumbar levocurvature with apex at L3. Mild interval loss of height of L1 vertebral body. Moderate L2-3 and severe L3-L5 loss of intervertebral disc space height. Normal lumbar lordosis without listhesis. Lower lumbar facet arthrosis. IMPRESSION: 1. Mild interval loss of  height of L1 vertebral body may represent acute compression deformity. 2. Stable lumbar spine spondylosis. 3. Aortic atherosclerosis. Electronically Signed   By: Kristine Garbe M.D.   On: 02/15/2017 02:29   Dg Chest 2 View  Result Date: 02/15/2017 CLINICAL DATA:  Unwitnessed fall at home last night complains of mid low back pain, worse with movement, recent hx of pneumonia with ongoing cough, congestion, sob EXAM: CHEST  2 VIEW COMPARISON:  02/08/2017 FINDINGS: Retrocardiac airspace opacity, stable. Increase in airspace opacity in the right middle lobe. Increase in indistinctness of the upper zone pulmonary vasculature. Atherosclerotic calcification of the aortic arch. Borderline enlargement of the cardiopericardial silhouette. Blunting of both costophrenic angles. Fullness of the right infrahilar region. IMPRESSION: 1. Right middle lobe and left lower lobe airspace opacities. Multilobar pneumonia not excluded. 2. Right infrahilar fullness could be from adenopathy or an infrahilar lesion. Consider chest CT. 3. Borderline enlargement of the cardiopericardial silhouette with cephalization of blood flow compatible with pulmonary venous hypertension. 4.  Aortic Atherosclerosis (ICD10-I70.0). Electronically Signed   By: Van Clines M.D.   On: 02/15/2017 08:14  Dg Chest 2 View  Result Date: 02/08/2017 CLINICAL DATA:  Anemia, low iron, dizziness and nausea beginning at 1500 hours, cough, congestion and crackles, history pulmonary hypertension, diabetes mellitus, stage III chronic kidney disease, diastolic dysfunction, transitional cell carcinoma of the bladder, prostate cancer, former smoker EXAM: CHEST  2 VIEW COMPARISON:  06/20/2012 FINDINGS: Enlargement of cardiac silhouette. Normal mediastinal contours and pulmonary vascularity. Atherosclerotic calcification aorta. Emphysematous and bronchitic changes with calcified granuloma in lower RIGHT chest. Atelectasis versus consolidation in BILATERAL  lower lobes greater on RIGHT. Remaining lungs clear. No acute infiltrate, pleural effusion or pneumothorax. Bones demineralized. IMPRESSION: Bibasilar atelectasis versus consolidation greater on RIGHT new since previous exam. Enlargement of cardiac silhouette with underlying emphysematous changes. Aortic atherosclerosis. Electronically Signed   By: Lavonia Dana M.D.   On: 02/08/2017 17:17   Dg Lumbar Spine Complete  Result Date: 02/15/2017 CLINICAL DATA:  84 y/o  M; fall onto back with lower back pain. EXAM: LUMBAR SPINE - COMPLETE 4+ VIEW COMPARISON:  03/10/2016 CT abdomen and pelvis. FINDINGS: Abdominal aortic calcific atherosclerosis. Mild lumbar levocurvature with apex at L3. Mild interval loss of height of L1 vertebral body. Moderate L2-3 and severe L3-L5 loss of intervertebral disc space height. Normal lumbar lordosis without listhesis. Lower lumbar facet arthrosis. IMPRESSION: 1. Mild interval loss of height of L1 vertebral body may represent acute compression deformity. 2. Stable lumbar spine spondylosis. 3. Aortic atherosclerosis. Electronically Signed   By: Kristine Garbe M.D.   On: 02/15/2017 02:29   Ct Head Wo Contrast  Result Date: 02/08/2017 CLINICAL DATA:  Dizziness and nausea EXAM: CT HEAD WITHOUT CONTRAST TECHNIQUE: Contiguous axial images were obtained from the base of the skull through the vertex without intravenous contrast. COMPARISON:  None. FINDINGS: Brain: No mass lesion, intraparenchymal hemorrhage or extra-axial collection. No evidence of acute cortical infarct. There is periventricular hypoattenuation compatible with chronic microvascular disease. Vascular: No hyperdense vessel or unexpected calcification. Skull: Normal visualized skull base, calvarium and extracranial soft tissues. Sinuses/Orbits: No sinus fluid levels or advanced mucosal thickening. No mastoid effusion. Normal orbits. IMPRESSION: Mild chronic microvascular changes without acute abnormality.  Electronically Signed   By: Ulyses Jarred M.D.   On: 02/08/2017 17:08      EKG: Independently reviewed   Assessment/Plan Principal Problem: Acute on chronic hypoxic respiratory failure secondary to multifocal pneumonia, not improved from previous admission Patient is being admitted to telemetry as inpatient He will be started on cefepime Pneumonia order set initiated CT chest recommended to further evaluate patient's unresolving pneumonia as well as infrahilar mass  Mild interval loss of height of L1 vertebrae Patient may have an acute compression fracture Will add Robaxin and continue Tylenol for pain control   PT OT consultation has been requested No significant pain, do not feel kyphoplasty is needed  DM type 2 - CBG stable; continue SSI, will check hemoglobin A1c  Fall/Dizziness.recent CT head negative. - presume secondary to acute illness/aortic stenosis, will check orthostatics - echo showed normal LVEF, grade 2 diastolic dysfunction, slight worsening of aortic stenosis - carotid u/s was unremarkable - no further evaluation planned based on workup done during recent admission  Chronic normocytic anemia followed by Dr. Irene Limbo. - Hgb stable, f/u as outpatient  Aortic stenosis  - echo showed slight worsening of aortic stenosis, now moderate to severe  CKD stage III - creatinine stable      DVT prophylaxis:  heparin     Code Status Orders full code   (From admission, onward)  Start     Ordered   02/15/17 0850  Full code  Continuous     02/15/17 0853    Code Status History      consults called:    Family Communication: Admission, patients condition and plan of care including tests being ordered have been discussed with the patient  who indicates understanding and agree with the plan and Code Status  Admission status: inpatient    Disposition plan: Further plan will depend as patient's clinical course evolves and further radiologic and  laboratory data become available. Likely home when stable   At the time of admission, it appears that the appropriate admission status for this patient is INPATIENT .Thisis judged to be reasonable and necessary in order to provide the required intensity of service to ensure the patient's safetygiven thepresenting symptoms, physical exam findings, and initial radiographic and laboratory data in the context of their chronic comorbidities.   Reyne Dumas MD [facility] Pager [phone]  If 7PM-7AM, please contact night-coverage [email or web address] Password [facility]  02/15/2017, 8:56 AM

## 2017-02-15 NOTE — ED Notes (Signed)
Patient ambulated in room with 2 assist. Lives home alone.

## 2017-02-15 NOTE — ED Notes (Signed)
ED TO INPATIENT HANDOFF REPORT  Name/Age/Gender Alexander Barnett 84 y.o. male  Code Status    Code Status Orders  (From admission, onward)        Start     Ordered   02/15/17 0850  Full code  Continuous     02/15/17 0853    Code Status History    Date Active Date Inactive Code Status Order ID Comments User Context   02/08/2017 20:52 02/12/2017 18:11 Full Code [id number]  Jani Gravel, MD Inpatient   03/26/2014 14:39 04/01/2014 15:08 Full Code [id number]  Franchot Gallo, MD Inpatient    Advance Directive Documentation     Most Recent Value  Type of Advance Directive  Healthcare Power of Attorney, Living will  Pre-existing out of facility DNR order (yellow form or pink MOST form)  No data  "MOST" Form in Place?  No data      Home/SNF/Other Home  Chief Complaint Fall  Level of Care/Admitting Diagnosis ED Disposition    ED Disposition Condition Comment   Admit  Hospital Area: [facility] [[id number]]  Level of Care: Telemetry [5]  Admit to tele based on following criteria: Other see comments  Comments: cap  Diagnosis: CAP (community acquired pneumonia) [034742]  Admitting Physician: Reyne Dumas [[id number]]  Attending Physician: Reyne Dumas [[id number]]  Estimated length of stay: past midnight tomorrow  Certification:: I certify this patient will need inpatient services for at least 2 midnights  PT Class (Do Not Modify): Inpatient [101]  PT Acc Code (Do Not Modify): Private [1]       Medical History Past Medical History:  Diagnosis Date  . Anemia   . Aortic stenosis    moderate by echo 12/2015  . Arthritis    Fingers  . BPH (benign prostatic hyperplasia)   . Bradycardia   . CKD (chronic kidney disease) stage 3, GFR 30-59 ml/min (HCC)   . Diabetes mellitus without complication ([place])    prediabetes diet controlled  . Diastolic dysfunction   . Diverticulosis of colon   . Erectile dysfunction   . Hematuria   . History of kidney cancer   . History of  skin cancer   . Hx of transfusion   . Hypertension   . Persistent atrial fibrillation ([place]) 06/2006   refused anticoagulation  . Prostate cancer ([place])   . Pulmonary HTN ([place]) 12/23/2015   Moderate with PASP 24mHg  . 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 Amoxicillin Diarrhea    Has patient had a PCN reaction causing immediate rash, facial/tongue/throat swelling, SOB or lightheadedness with hypotension: no Has patient had a PCN reaction causing severe rash involving mucus membranes or skin necrosis: no Has patient had a PCN reaction that required hospitalization: unknown Has patient had a PCN reaction occurring within the last 10 years: no If all of the above answers are "NO", then may proceed with Cephalosporin use.  . Ciprofloxacin Other (See Comments)    Muscle strain in groin    IV Location/Drains/Wounds Patient Lines/Drains/Airways Status   Active Line/Drains/Airways    Name:   Placement date:   Placement time:   Site:   Days:   Peripheral IV 02/15/17 Right Hand   02/15/17    0934    Hand   less than 1   Urethral Catheter Jeneen, RN Straight-tip;Double-lumen 16 Fr.   11/18/16    0155    Straight-tip;Double-lumen   89  Labs/Imaging Results for orders placed or performed during the hospital encounter of 02/15/17 (from the past 48 hour(s))  Comprehensive metabolic panel     Status: Abnormal   Collection Time: 02/15/17  6:22 AM  Result Value Ref Range   Sodium 135 135 - 145 mmol/L   Potassium 3.8 3.5 - 5.1 mmol/L   Chloride 107 101 - 111 mmol/L   CO2 22 22 - 32 mmol/L   Glucose, Bld 95 65 - 99 mg/dL   BUN 19 6 - 20 mg/dL   Creatinine, Ser 1.27 (H) 0.61 - 1.24 mg/dL   Calcium 7.9 (L) 8.9 - 10.3 mg/dL   Total Protein 6.3 (L) 6.5 - 8.1 g/dL   Albumin 3.0 (L) 3.5 - 5.0 g/dL   AST 26 15 - 41 U/L   ALT 18 17 - 63 U/L   Alkaline Phosphatase 71 38 - 126 U/L   Total Bilirubin 0.6 0.3 - 1.2  mg/dL   GFR calc non Af Amer 47 (L) >60 mL/min   GFR calc Af Amer 54 (L) >60 mL/min    Comment: (NOTE) The eGFR has been calculated using the CKD EPI equation. This calculation has not been validated in all clinical situations. eGFR's persistently <60 mL/min signify possible Chronic Kidney Disease.    Anion gap 6 5 - 15  CBC with Differential     Status: Abnormal   Collection Time: 02/15/17  6:22 AM  Result Value Ref Range   WBC 9.9 4.0 - 10.5 K/uL   RBC 3.05 (L) 4.22 - 5.81 MIL/uL   Hemoglobin 8.2 (L) 13.0 - 17.0 g/dL   HCT 25.2 (L) 39.0 - 52.0 %   MCV 82.6 78.0 - 100.0 fL   MCH 26.9 26.0 - 34.0 pg   MCHC 32.5 30.0 - 36.0 g/dL   RDW 21.6 (H) 11.5 - 15.5 %   Platelets 331 150 - 400 K/uL   Neutrophils Relative % 70 %   Lymphocytes Relative 12 %   Monocytes Relative 15 %   Eosinophils Relative 2 %   Basophils Relative 1 %   Neutro Abs 6.9 1.7 - 7.7 K/uL   Lymphs Abs 1.2 0.7 - 4.0 K/uL   Monocytes Absolute 1.5 (H) 0.1 - 1.0 K/uL   Eosinophils Absolute 0.2 0.0 - 0.7 K/uL   Basophils Absolute 0.1 0.0 - 0.1 K/uL   RBC Morphology POLYCHROMASIA PRESENT     Comment: T[employer] CELLS  CBG monitoring, ED     Status: None   Collection Time: 02/15/17  9:30 AM  Result Value Ref Range   Glucose-Capillary 79 65 - 99 mg/dL   Comment 1 Notify RN    Dg Chest 2 View  Result Date: 02/15/2017 CLINICAL DATA:  Unwitnessed fall at home last night complains of mid low back pain, worse with movement, recent hx of pneumonia with ongoing cough, congestion, sob EXAM: CHEST  2 VIEW COMPARISON:  02/08/2017 FINDINGS: Retrocardiac airspace opacity, stable. Increase in airspace opacity in the right middle lobe. Increase in indistinctness of the upper zone pulmonary vasculature. Atherosclerotic calcification of the aortic arch. Borderline enlargement of the cardiopericardial silhouette. Blunting of both costophrenic angles. Fullness of the right infrahilar region. IMPRESSION: 1. Right middle lobe and left lower  lobe airspace opacities. Multilobar pneumonia not excluded. 2. Right infrahilar fullness could be from adenopathy or an infrahilar lesion. Consider chest CT. 3. Borderline enlargement of the cardiopericardial silhouette with cephalization of blood flow compatible with pulmonary venous hypertension. 4.  Aortic Atherosclerosis (ICD10-I70.0). Electronically Signed  By: Van Clines M.D.   On: 02/15/2017 08:14   Dg Lumbar Spine Complete  Result Date: 02/15/2017 CLINICAL DATA:  84 y/o  M; fall onto back with lower back pain. EXAM: LUMBAR SPINE - COMPLETE 4+ VIEW COMPARISON:  03/10/2016 CT abdomen and pelvis. FINDINGS: Abdominal aortic calcific atherosclerosis. Mild lumbar levocurvature with apex at L3. Mild interval loss of height of L1 vertebral body. Moderate L2-3 and severe L3-L5 loss of intervertebral disc space height. Normal lumbar lordosis without listhesis. Lower lumbar facet arthrosis. IMPRESSION: 1. Mild interval loss of height of L1 vertebral body may represent acute compression deformity. 2. Stable lumbar spine spondylosis. 3. Aortic atherosclerosis. Electronically Signed   By: Kristine Garbe M.D.   On: 02/15/2017 02:29    Pending Labs Unresulted Labs (From admission, onward)   Start     Ordered   02/16/17 0500  Procalcitonin  Daily,   R     02/15/17 0847   02/16/17 0500  Comprehensive metabolic panel  Tomorrow morning,   R     02/15/17 0853   02/16/17 0500  CBC  Tomorrow morning,   R     02/15/17 0853   02/15/17 0911  Hemoglobin A1c  Once,   R    Comments:  To assess prior glycemic control    02/15/17 0910   02/15/17 0850  Culture, blood (routine x 2) Call MD if unable to obtain prior to antibiotics being given  BLOOD CULTURE X 2,   R    Comments:  If blood cultures drawn in Emergency Department - Do not draw and cancel order    02/15/17 0853   02/15/17 0850  Culture, sputum-assessment  Once,   R     02/15/17 0853   02/15/17 0850  Gram stain  Once,   R     02/15/17  0853   02/15/17 0850  Strep pneumoniae urinary antigen  Once,   R     02/15/17 0853   02/15/17 0848  Procalcitonin - Baseline  STAT,   STAT     02/15/17 0847   02/15/17 0848  Urinalysis, Routine w reflex microscopic  Once,   R     02/15/17 0847      Vitals/Pain Today's Vitals   02/15/17 0100 02/15/17 0101 02/15/17 0313 02/15/17 0515  BP:   132/70 132/63  Pulse:   70 83  Resp:   18 12  Temp:   98 F (36.7 C)   TempSrc:   Oral   SpO2: 93%  95% 90%  Weight:      PainSc:  10-Worst pain ever      Isolation Precautions No active isolations  Medications Medications  heparin injection 5,000 Units (not administered)  0.9 %  sodium chloride infusion ( Intravenous New Bag/Given 02/15/17 0934)  ceFEPIme (MAXIPIME) 1 g in dextrose 5 % 50 mL IVPB (1 g Intravenous New Bag/Given 02/15/17 0934)  insulin aspart (novoLOG) injection 0-9 Units (not administered)  methocarbamol (ROBAXIN) tablet 250 mg (not administered)  acetaminophen (TYLENOL) tablet 650 mg (not administered)  acetaminophen (TYLENOL) tablet 650 mg (650 mg Oral Given 02/15/17 0311)    Mobility With walker

## 2017-02-15 NOTE — ED Notes (Signed)
Bed: WA21 Expected date:  Expected time:  Means of arrival:  Comments: 89 yr fall, back pain

## 2017-02-16 LAB — COMPREHENSIVE METABOLIC PANEL
ALT: 14 U/L — AB (ref 17–63)
AST: 20 U/L (ref 15–41)
Albumin: 2.5 g/dL — ABNORMAL LOW (ref 3.5–5.0)
Alkaline Phosphatase: 64 U/L (ref 38–126)
Anion gap: 5 (ref 5–15)
BUN: 15 mg/dL (ref 6–20)
CALCIUM: 7.6 mg/dL — AB (ref 8.9–10.3)
CO2: 23 mmol/L (ref 22–32)
CREATININE: 1.17 mg/dL (ref 0.61–1.24)
Chloride: 108 mmol/L (ref 101–111)
GFR, EST AFRICAN AMERICAN: 60 mL/min — AB (ref >60)
GFR, EST NON AFRICAN AMERICAN: 51 mL/min — AB (ref >60)
Glucose, Bld: 93 mg/dL (ref 65–99)
Potassium: 3.6 mmol/L (ref 3.5–5.1)
Sodium: 136 mmol/L (ref 135–145)
Total Bilirubin: 0.4 mg/dL (ref 0.3–1.2)
Total Protein: 5.6 g/dL — ABNORMAL LOW (ref 6.5–8.1)

## 2017-02-16 LAB — CBC
HCT: 25.1 % — ABNORMAL LOW (ref 39.0–52.0)
Hemoglobin: 8.1 g/dL — ABNORMAL LOW (ref 13.0–17.0)
MCH: 26.3 pg (ref 26.0–34.0)
MCHC: 32.3 g/dL (ref 30.0–36.0)
MCV: 81.5 fL (ref 78.0–100.0)
PLATELETS: 391 10*3/uL (ref 150–400)
RBC: 3.08 MIL/uL — AB (ref 4.22–5.81)
RDW: 22.5 % — AB (ref 11.5–15.5)
WBC: 12 10*3/uL — AB (ref 4.0–10.5)

## 2017-02-16 LAB — GLUCOSE, CAPILLARY
GLUCOSE-CAPILLARY: 93 mg/dL (ref 65–99)
Glucose-Capillary: 89 mg/dL (ref 65–99)
Glucose-Capillary: 143 mg/dL — ABNORMAL HIGH (ref 65–99)
Glucose-Capillary: 97 mg/dL (ref 65–99)

## 2017-02-16 LAB — MRSA PCR SCREENING: MRSA BY PCR: NEGATIVE

## 2017-02-16 LAB — PROCALCITONIN: Procalcitonin: 0.12 ng/mL

## 2017-02-16 MED ORDER — PROSIGHT PO TABS
1.0000 | ORAL_TABLET | Freq: Every day | ORAL | Status: DC
Start: 2017-02-16 — End: 2017-02-17
  Administered 2017-02-16 – 2017-02-17 (×2): 1 via ORAL
  Filled 2017-02-16 (×2): qty 1

## 2017-02-16 MED ORDER — GLUCERNA SHAKE PO LIQD
237.0000 mL | Freq: Three times a day (TID) | ORAL | Status: DC
  Administered 2017-02-16 – 2017-02-17 (×2): 237 mL via ORAL
  Filled 2017-02-16 (×5): qty 237

## 2017-02-16 NOTE — Progress Notes (Signed)
PROGRESS NOTE    Alexander Barnett  VQM:[id number] DOB: 11-Apr-1920 DOA: 02/15/2017 PCP: Jonathon Jordan, MD   Brief Narrative:  84 year old male with Ura Yingling history of type 2 diabetes, normocytic anemia, aortic stenosis, chronic kidney disease, moderate malnutrition, who was recently admitted and discharged between 1/24-1/28 after being diagnosed with RSV/community-acquired pneumonia. Patient was discharged in 2-3 L of oxygen continuously for short-term use . Also discharged on cefuroxime after being treated with ceftriaxone and azithromycin.2-D echo showed worsening aortic stenosis with moderate to severe findings . Patient was discharged home 1/28  with home health. Patient presents today 1/31  after fall, after he tripped over Artrice Kraker chair. He lives by himself. Patient normally uses Shadman Tozzi walker at baseline. Today he fell on the floor and collapsed. He presented with back pain to the ED today. Patient has taken Tylenol which seems to have helped Phuc Kluttz little. Patient continues to cough. Does not report any fever.  Assessment & Plan:   Principal Problem:   CAP (community acquired pneumonia) Active Problems:   Hypertension   Hematuria   Anemia   Hx of transfusion   History of kidney cancer   Persistent atrial fibrillation (HCC)   CKD (chronic kidney disease) stage 3, GFR 30-59 ml/min (HCC)   Diastolic dysfunction   Pulmonary HTN (HCC)   MDS (myelodysplastic syndrome), low grade (HCC)   DM type 2 (diabetes mellitus, type 2) (HCC)   Acute on chronic hypoxic respiratory failure secondary to multifocal pneumonia - recently discharged on cefuroxime for pneumonia.  Pt also positive for RSV.   Cefepime, will narrow as able PCT 0.24 on admission, 0.12 today Negative urine strep CT chest with bilateral lower lobe airspace opacities compatible with pneumonia, small pleural effusions. Will wean O2  Mild interval loss of height of L1 vertebrae Patient may have an acute compression fracture Follow vitamin D  level Will add Robaxin and continue Tylenol for pain control  PT OT consultation has been requested, recommending SNF  DM type 2 -CBG stable; continue SSI, will check hemoglobin A1c (5.0)  Fall/Dizziness.recent CT head negative.  He denies LH to me today.  He notes the fall was more mechanical.  Pt with positive orthostatics.  Suspect this was contributing to fall.  Will d/c amlodipine for now.  Continue to follow BP's.  Continue IVF.  Follow repeat orthostatics tomorrow.  - presume secondary to acute illness/aortic stenosis, will check orthostatics - echo showed normal LVEF, grade 2 diastolic dysfunction, slight worsening of aortic stenosis - carotid u/s was unremarkable - no further evaluation planned based on workup done during recent admission - will need follow up with cards  Chronic normocytic anemiafollowed by Dr. Irene Limbo. - Hgb stable, f/u as outpatient  Aortic stenosis  - echo showed slight worsening of aortic stenosis, now moderate to severe - outpatient f/u  CKD stage III - creatinine stable  Leukocytosis: mild, likely 2/2 above, ctm  DVT prophylaxis: heparin Code Status: DNR Family Communication: none at bedside Disposition Plan: SNF   Consultants:   none  Procedures:   none  Antimicrobials:  Anti-infectives (From admission, onward)   Start     Dose/Rate Route Frequency Ordered Stop   02/15/17 0930  ceFEPIme (MAXIPIME) 1 g in dextrose 5 % 50 mL IVPB     1 g 100 mL/hr over 30 Minutes Intravenous Every 12 hours 02/15/17 0854     02/15/17 0900  levofloxacin (LEVAQUIN) IVPB 750 mg  Status:  Discontinued     750 mg 100 mL/hr over 90  Minutes Intravenous Every 24 hours 02/15/17 0853 02/15/17 0853       Subjective: Denies SOB or CP. No LOC, no LH. He was getting up out of chair and fell backwards.  Denies LH or LOC during this. No LH with orthostatics Senica Crall while ago.   Objective: Vitals:   02/15/17 1012 02/15/17 1500 02/15/17 2247 02/16/17 0415  BP:  (!) 153/64 130/62 137/66 136/67  Pulse: 70 61 62 67  Resp: 18 16 18 17   Temp: 98.2 F (36.8 C) 98 F (36.7 C) 98 F (36.7 C) 98.7 F (37.1 C)  TempSrc: Oral Oral Oral Oral  SpO2: 99%  99% 98%  Weight: 62.3 kg (137 lb 5.6 oz)     Height: 5\' 6"  (1.676 m)       Intake/Output Summary (Last 24 hours) at 02/16/2017 0937 Last data filed at 02/16/2017 0910 Gross per 24 hour  Intake 2257.5 ml  Output 660 ml  Net 1597.5 ml   Filed Weights   02/15/17 0048 02/15/17 1012  Weight: 63.5 kg (140 lb) 62.3 kg (137 lb 5.6 oz)    Examination:  General exam: Appears calm and comfortable  Respiratory system: Crackles R base>L Respiratory effort normal. Cardiovascular system: S1 & S2 heard, RRR. No JVD, murmurs, rubs, gallops or clicks. No pedal edema. Gastrointestinal system: Abdomen is nondistended, soft and nontender. No organomegaly or masses felt. Normal bowel sounds heard. Central nervous system: Alert and oriented. No focal neurological deficits. Extremities: Symmetric 5 x 5 power. Skin: No rashes, lesions or ulcers Psychiatry: Judgement and insight appear normal. Mood & affect appropriate.     Data Reviewed: I have personally reviewed following labs and imaging studies  CBC: Recent Labs  Lab 02/10/17 0438 02/14/17 1026 02/15/17 0622 02/16/17 0357  WBC 13.0* 8.3 9.9 12.0*  NEUTROABS  --  6.1 6.9  --   HGB 8.0*  --  8.2* 8.1*  HCT 24.3* 27.4* 25.2* 25.1*  MCV 81.5 83.3 82.6 81.5  PLT 161 322 331 338   Basic Metabolic Panel: Recent Labs  Lab 02/15/17 0622 02/16/17 0357  NA 135 136  K 3.8 3.6  CL 107 108  CO2 22 23  GLUCOSE 95 93  BUN 19 15  CREATININE 1.27* 1.17  CALCIUM 7.9* 7.6*   GFR: Estimated Creatinine Clearance: 34 mL/min (by C-G formula based on SCr of 1.17 mg/dL). Liver Function Tests: Recent Labs  Lab 02/15/17 0622 02/16/17 0357  AST 26 20  ALT 18 14*  ALKPHOS 71 64  BILITOT 0.6 0.4  PROT 6.3* 5.6*  ALBUMIN 3.0* 2.5*   No results for input(s):  LIPASE, AMYLASE in the last 168 hours. No results for input(s): AMMONIA in the last 168 hours. Coagulation Profile: No results for input(s): INR, PROTIME in the last 168 hours. Cardiac Enzymes: No results for input(s): CKTOTAL, CKMB, CKMBINDEX, TROPONINI in the last 168 hours. BNP (last 3 results) No results for input(s): PROBNP in the last 8760 hours. HbA1C: Recent Labs    02/15/17 0622  HGBA1C 5.0   CBG: Recent Labs  Lab 02/15/17 0930 02/15/17 1149 02/15/17 1648 02/15/17 2233 02/16/17 0729  GLUCAP 79 90 86 89 89   Lipid Profile: No results for input(s): CHOL, HDL, LDLCALC, TRIG, CHOLHDL, LDLDIRECT in the last 72 hours. Thyroid Function Tests: No results for input(s): TSH, T4TOTAL, FREET4, T3FREE, THYROIDAB in the last 72 hours. Anemia Panel: Recent Labs    02/14/17 1026  RETICCTPCT 3.2*   Sepsis Labs: Recent Labs  Lab 02/15/17 0913  02/16/17 0357  PROCALCITON 0.24 0.12    Recent Results (from the past 240 hour(s))  Culture, blood (routine x 2) Call MD if unable to obtain prior to antibiotics being given     Status: None   Collection Time: 02/08/17  8:50 PM  Result Value Ref Range Status   Specimen Description BLOOD LEFT ANTECUBITAL  Final   Special Requests   Final    BOTTLES DRAWN AEROBIC AND ANAEROBIC Blood Culture adequate volume   Culture   Final    NO GROWTH 5 DAYS Performed at [facility], [place] [address] [place], [place] [address]    Report Status 02/13/2017 FINAL  Final  Respiratory Panel by PCR     Status: Abnormal   Collection Time: 02/08/17  8:53 PM  Result Value Ref Range Status   Adenovirus NOT DETECTED NOT DETECTED Final   Coronavirus 229E NOT DETECTED NOT DETECTED Final   Coronavirus HKU1 NOT DETECTED NOT DETECTED Final   Coronavirus NL63 NOT DETECTED NOT DETECTED Final   Coronavirus OC43 NOT DETECTED NOT DETECTED Final   Metapneumovirus NOT DETECTED NOT DETECTED Final   Rhinovirus / Enterovirus NOT DETECTED NOT DETECTED Final    Influenza Avigdor Dollar NOT DETECTED NOT DETECTED Final   Influenza B NOT DETECTED NOT DETECTED Final   Parainfluenza Virus 1 NOT DETECTED NOT DETECTED Final   Parainfluenza Virus 2 NOT DETECTED NOT DETECTED Final   Parainfluenza Virus 3 NOT DETECTED NOT DETECTED Final   Parainfluenza Virus 4 NOT DETECTED NOT DETECTED Final   Respiratory Syncytial Virus DETECTED (Sie Formisano) NOT DETECTED Final    Comment: CRITICAL RESULT CALLED TO, READ BACK BY AND VERIFIED WITH: B HODGES RN 02/09/17 0554A JDW    Bordetella pertussis NOT DETECTED NOT DETECTED Final   Chlamydophila pneumoniae NOT DETECTED NOT DETECTED Final   Mycoplasma pneumoniae NOT DETECTED NOT DETECTED Final    Comment: Performed at [facility], [place] [address] [place], [place] [address]  Culture, blood (routine x 2) Call MD if unable to obtain prior to antibiotics being given     Status: None   Collection Time: 02/08/17  9:02 PM  Result Value Ref Range Status   Specimen Description BLOOD RIGHT ANTECUBITAL  Final   Special Requests   Final    BOTTLES DRAWN AEROBIC AND ANAEROBIC Blood Culture adequate volume   Culture   Final    NO GROWTH 5 DAYS Performed at [facility], [place] [address], [place], [place] [address]    Report Status 02/13/2017 FINAL  Final  Culture, sputum-assessment     Status: None   Collection Time: 02/09/17  5:34 AM  Result Value Ref Range Status   Specimen Description SPUTUM  Final   Special Requests Normal  Final   Sputum evaluation THIS SPECIMEN IS ACCEPTABLE FOR SPUTUM CULTURE  Final   Report Status 02/09/2017 FINAL  Final  Culture, respiratory (NON-Expectorated)     Status: None   Collection Time: 02/09/17  5:34 AM  Result Value Ref Range Status   Specimen Description SPUTUM  Final   Special Requests Normal Reflexed from [id number]  Final   Gram Stain   Final    ABUNDANT WBC PRESENT, PREDOMINANTLY PMN RARE SQUAMOUS EPITHELIAL CELLS PRESENT FEW GRAM POSITIVE COCCI IN CHAINS RARE GRAM NEGATIVE RODS RARE GRAM  POSITIVE RODS    Culture   Final    FEW Consistent with normal respiratory flora. Performed at [facility], [place] [address], [place], [place] [address]  Report Status 02/11/2017 FINAL  Final         Radiology Studies: Dg Chest 2 View  Result Date: 02/15/2017 CLINICAL DATA:  Unwitnessed fall at home last night complains of mid low back pain, worse with movement, recent hx of pneumonia with ongoing cough, congestion, sob EXAM: CHEST  2 VIEW COMPARISON:  02/08/2017 FINDINGS: Retrocardiac airspace opacity, stable. Increase in airspace opacity in the right middle lobe. Increase in indistinctness of the upper zone pulmonary vasculature. Atherosclerotic calcification of the aortic arch. Borderline enlargement of the cardiopericardial silhouette. Blunting of both costophrenic angles. Fullness of the right infrahilar region. IMPRESSION: 1. Right middle lobe and left lower lobe airspace opacities. Multilobar pneumonia not excluded. 2. Right infrahilar fullness could be from adenopathy or an infrahilar lesion. Consider chest CT. 3. Borderline enlargement of the cardiopericardial silhouette with cephalization of blood flow compatible with pulmonary venous hypertension. 4.  Aortic Atherosclerosis (ICD10-I70.0). Electronically Signed   By: Van Clines M.D.   On: 02/15/2017 08:14   Dg Lumbar Spine Complete  Result Date: 02/15/2017 CLINICAL DATA:  84 y/o  M; fall onto back with lower back pain. EXAM: LUMBAR SPINE - COMPLETE 4+ VIEW COMPARISON:  03/10/2016 CT abdomen and pelvis. FINDINGS: Abdominal aortic calcific atherosclerosis. Mild lumbar levocurvature with apex at L3. Mild interval loss of height of L1 vertebral body. Moderate L2-3 and severe L3-L5 loss of intervertebral disc space height. Normal lumbar lordosis without listhesis. Lower lumbar facet arthrosis. IMPRESSION: 1. Mild interval loss of height of L1 vertebral body may represent acute compression deformity. 2. Stable lumbar spine  spondylosis. 3. Aortic atherosclerosis. Electronically Signed   By: Kristine Garbe M.D.   On: 02/15/2017 02:29   Ct Chest Wo Contrast  Result Date: 02/15/2017 CLINICAL DATA:  Cough.  Fall. EXAM: CT CHEST WITHOUT CONTRAST TECHNIQUE: Multidetector CT imaging of the chest was performed following the standard protocol without IV contrast. COMPARISON:  Chest radiograph 02/15/2017 FINDINGS: Cardiovascular: Normal heart size. Aortic atherosclerosis. Calcification in the LAD and left circumflex coronary arteries noted. No pericardial effusion. Mediastinum/Nodes: Calcified mediastinal and right hilar lymph nodes identified. The trachea appears patent and is midline. Normal appearance of the esophagus. No axillary or supraclavicular adenopathy. Lungs/Pleura: Small bilateral pleural effusions. Airspace consolidation is identified in both lung bases, left greater than right. Atelectasis noted in the right middle lobe and anterolateral right lower lobe. Calcified granuloma identified within the right upper lobe. Upper Abdomen: Status post left nephrectomy. No acute findings noted within the upper abdomen. Musculoskeletal: The bones appear diffusely osteopenic. Chronic healed right lateral rib deformities identified. No acute displaced rib deformities. Mild scoliosis and degenerative disc disease. Age-indeterminate compression fractures are noted involving T9 and L1. The L1 compression fracture may be acute. The T9 fracture appears more chronic. IMPRESSION: 1. Bilateral lower lobe airspace opacities compatible with pneumonia. Small pleural effusions are noted. 2. T9 and L1 compression deformities. The L1 compression fracture may be acute. 3. Aortic Atherosclerosis (ICD10-I70.0). LAD and left circumflex coronary artery calcifications. Electronically Signed   By: Kerby Moors M.D.   On: 02/15/2017 10:01        Scheduled Meds: . amLODipine  5 mg Oral Daily  . atenolol  25 mg Oral Daily  . cholecalciferol   1,000 Units Oral Daily  . finasteride  5 mg Oral q morning - 10a  . heparin  5,000 Units Subcutaneous Q8H  . insulin aspart  0-9 Units Subcutaneous TID WC  . multivitamin  1 tablet Oral Daily   Continuous  Infusions: . sodium chloride 75 mL/hr at 02/16/17 0134  . ceFEPime (MAXIPIME) IV Stopped (02/15/17 2257)     LOS: 1 day    Time spent: over 34 min    Fayrene Helper, MD [facility] Pager [phone]  If 7PM-7AM, please contact night-coverage [email or web address] Password [facility] 02/16/2017, 9:37 AM

## 2017-02-16 NOTE — Progress Notes (Signed)
Initial Nutrition Assessment  DOCUMENTATION CODES:   Non-severe (moderate) malnutrition in context of chronic illness  INTERVENTION:    Glucerna Shake po TID, each supplement provides 220 kcal and 10 grams of protein  Provide MVI daily  NUTRITION DIAGNOSIS:   Moderate Malnutrition related to chronic illness(CKD III/hx multiple cancers) as evidenced by 5.5% weight loss in one month, energy intake < or equal to 50% for > or equal to 1 month, mild fat depletion, moderate fat depletion, moderate muscle depletion.  GOAL:   Patient will meet greater than or equal to 90% of their needs  MONITOR:   PO intake, Supplement acceptance, Weight trends, Labs  REASON FOR ASSESSMENT:   Malnutrition Screening Tool    ASSESSMENT:   Pt with PMH significant for CKD stage 3, DM 2, HTN, prostate cancer, bladder cancer, kidney cancer, anemia, and s/p nephrectomy. Recently admitted 1/24 for CAP and UTI. Presents this admission after mechanical fall. Admitted for acute on chronic respiratory failure secondary to multifocal PNA and possible acute compression fracture of L1 vertebrae.    Pt seen 1/24 by clinical dietitian at [facility]. Pt reports appetite has not progressed since being discharged 1/28. States he stays busy with church or family and often forgets to eat. Pt typically gets in two meals per day and are usually from restaurants. Pt did not purchase supplementation after his last discharge. Discussed maybe drinking a Glucerna/Ensure at breakfast time since that is the hardest meal for him to tolerate. Pt amendable.   Weight continues to trend down since last admission. Pt noted to weigh 145 lb during his admission 01/03/17 and 137 lb this stay. This shows a 5.5% wt loss in one month which is significant. Nutrition-Focused physical exam completed.   Medications reviewed and include: Vit D, SSI, MVI, NS @ 75 ml/hr, IV abx Labs reviewed.   NUTRITION - FOCUSED PHYSICAL EXAM:    Most Recent Value   Orbital Region  Mild depletion  Upper Arm Region  Moderate depletion  Thoracic and Lumbar Region  Unable to assess  Buccal Region  Mild depletion  Temple Region  No depletion  Clavicle Bone Region  Moderate depletion  Clavicle and Acromion Bone Region  Moderate depletion  Scapular Bone Region  Moderate depletion  Dorsal Hand  Mild depletion  Patellar Region  Moderate depletion  Anterior Thigh Region  Moderate depletion  Posterior Calf Region  Moderate depletion  Edema (RD Assessment)  Mild  Hair  Reviewed  Eyes  Reviewed  Mouth  Reviewed  Skin  Reviewed  Nails  Reviewed     Diet Order:  Diet Heart Room service appropriate? Yes; Fluid consistency: Thin  EDUCATION NEEDS:   Education needs have been addressed  Skin:  Skin Assessment: Reviewed RN Assessment  Last BM:  02/12/17  Height:   Ht Readings from Last 1 Encounters:  02/15/17 5\' 6"  (1.676 m)    Weight:   Wt Readings from Last 1 Encounters:  02/15/17 137 lb 5.6 oz (62.3 kg)    Ideal Body Weight:  64.5 kg  BMI:  Body mass index is 22.17 kg/m.  Estimated Nutritional Needs:   Kcal:  1600-1800 kcal/day  Protein:  80-90 g/day  Fluid:  >1.6 L/day    Mariana Single RD, LDN Clinical Nutrition Pager # - [phone]

## 2017-02-16 NOTE — Evaluation (Signed)
Occupational Therapy Evaluation Patient Details Name: Alexander Barnett MRN: [id number] DOB: 12-02-1920 Today's Date: 02/16/2017    History of Present Illness Pt was admitted for CAP vs HCAP and fall at home.  He was recently admitted for respiratory failure 2* pna.  PMH:  bladder CA, DM and CKD.     Clinical Impression   This 84 year old man was admitted for the above. He lives alone and does all IADLs himself. He fell after returning home from last hospitalization and would benefit from SNF to increase strength and endurance prior to returning home alone.  Basic adl goals in acute are for mod I level.    Follow Up Recommendations  SNF    Equipment Recommendations  None recommended by OT    Recommendations for Other Services       Precautions / Restrictions Precautions Precautions: Fall Restrictions Weight Bearing Restrictions: No      Mobility Bed Mobility Overal bed mobility: Modified Independent             General bed mobility comments: Increased time with use of bedrail  Transfers Overall transfer level: Needs assistance Equipment used: Rolling walker (2 wheeled) Transfers: Sit to/from Stand Sit to Stand: Min guard         General transfer comment: min cues for use of UEs to self assist.  Steady assist with initial standing    Balance Overall balance assessment: Needs assistance Sitting-balance support: No upper extremity supported;Feet supported Sitting balance-Leahy Scale: Good     Standing balance support: No upper extremity supported Standing balance-Leahy Scale: Fair                             ADL either performed or assessed with clinical judgement   ADL Overall ADL's : Needs assistance/impaired Eating/Feeding: Independent   Grooming: Supervision/safety;Standing   Upper Body Bathing: Set up;Sitting   Lower Body Bathing: Minimal assistance;Sit to/from stand   Upper Body Dressing : Set up;Sitting   Lower Body Dressing:  Minimal assistance;Sit to/from stand   Toilet Transfer: Min guard;Ambulation;RW   Toileting- Water quality scientist and Hygiene: Min guard;Sit to/from stand         General ADL Comments: Pt can gather ADL supplies with min guard for safety.      Vision         Perception     Praxis      Pertinent Vitals/Pain Pain Assessment: No/denies pain     Hand Dominance     Extremity/Trunk Assessment Upper Extremity Assessment Upper Extremity Assessment: Overall WFL for tasks assessed      Cervical / Trunk Assessment Cervical / Trunk Assessment: Kyphotic   Communication Communication Communication: HOH   Cognition Arousal/Alertness: Awake/alert Behavior During Therapy: WFL for tasks assessed/performed Overall Cognitive Status: Within Functional Limits for tasks assessed                                     General Comments       Exercises     Shoulder Instructions      Home Living Family/patient expects to be discharged to:: Skilled nursing facility Living Arrangements: Alone                 Bathroom Shower/Tub: Walk-in shower   Bathroom Toilet: Handicapped height     Home Equipment: Cane - single point   Additional Comments: Pt states  does not feel able to manage alone in current residence      Prior Functioning/Environment Level of Independence: Independent;Independent with assistive device(s)        Comments: using RW        OT Problem List: Decreased strength;Decreased activity tolerance;Impaired balance (sitting and/or standing);Decreased knowledge of use of DME or AE      OT Treatment/Interventions: Self-care/ADL training;DME and/or AE instruction;Patient/family education;Balance training    OT Goals(Current goals can be found in the care plan section) Acute Rehab OT Goals Patient Stated Goal: Regain IND and return home OT Goal Formulation: With patient Time For Goal Achievement: 03/02/17 Potential to Achieve Goals:  Good ADL Goals Pt Will Transfer to Toilet: with modified independence;ambulating(high commode) Additional ADL Goal #1: pt will gather clothes and complete adl  at mod I level  OT Frequency: Min 2X/week   Barriers to D/C:            Co-evaluation PT/OT/SLP Co-Evaluation/Treatment: Yes Reason for Co-Treatment: For patient/therapist safety PT goals addressed during session: Mobility/safety with mobility OT goals addressed during session: ADL's and self-care      AM-PAC PT "6 Clicks" Daily Activity     Outcome Measure Help from another person eating meals?: None Help from another person taking care of personal grooming?: A Little Help from another person toileting, which includes using toliet, bedpan, or urinal?: A Little Help from another person bathing (including washing, rinsing, drying)?: A Little Help from another person to put on and taking off regular upper body clothing?: A Little Help from another person to put on and taking off regular lower body clothing?: A Little 6 Click Score: 19   End of Session    Activity Tolerance: Patient tolerated treatment well Patient left: in chair;with call bell/phone within reach;with chair alarm set  OT Visit Diagnosis: Muscle weakness (generalized) (M62.81)                Time: 4709-2957 OT Time Calculation (min): 30 min Charges:  OT General Charges $OT Visit: 1 Visit OT Evaluation $OT Eval Low Complexity: 1 Low G-Codes:     [place], OTR/L [phone] 02/16/2017  Keisi Eckford 02/16/2017, 12:51 PM

## 2017-02-16 NOTE — Evaluation (Signed)
Clinical/Bedside Swallow Evaluation Patient Details  Name: Alexander Barnett MRN: [id number] Date of Birth: 17-Dec-1920  Today's Date: 02/16/2017 Time: SLP Start Time (ACUTE ONLY): 1100 SLP Stop Time (ACUTE ONLY): 1125 SLP Time Calculation (min) (ACUTE ONLY): 25 min  Past Medical History:  Past Medical History:  Diagnosis Date  . Anemia   . Aortic stenosis    moderate by echo 12/2015  . Arthritis    Fingers  . BPH (benign prostatic hyperplasia)   . Bradycardia   . CKD (chronic kidney disease) stage 3, GFR 30-59 ml/min (HCC)   . Diabetes mellitus without complication ([place])    prediabetes diet controlled  . Diastolic dysfunction   . Diverticulosis of colon   . Erectile dysfunction   . Hematuria   . History of kidney cancer   . History of skin cancer   . Hx of transfusion   . Hypertension   . Persistent atrial fibrillation ([place]) 06/2006   refused anticoagulation  . Prostate cancer ([place])   . Pulmonary HTN ([place]) 12/23/2015   Moderate with PASP 73mmHg  . Transitional cell carcinoma of bladder (HCC)    and renal pelvis   Past Surgical History:  Past Surgical History:  Procedure Laterality Date  . CYSTOSCOPY N/A 06/24/2012   Procedure: CYSTOSCOPY;  Surgeon: Dutch Gray, MD;  Location: [facility];  Service: Urology;  Laterality: N/A;  . CYSTOSCOPY N/A 08/05/2012   Procedure: CYSTOSCOPY;  Surgeon: Dutch Gray, MD;  Location: [facility];  Service: Urology;  Laterality: N/A;  CYSTO, CLOT EVACUATION, FULGERATION OF BLEEDING AREAS AT BLADDER NECK AND PROSTATIC URETHRA    . EYE SURGERY  02/2004   bil.cataract w/ lens implant  . HERNIA REPAIR  05/2002   right  . hyperbaric treatments     40 treatments for prostate-to try to stop bleeding  . imrt radiation  04/2006   for prostate cancer  . MOHS SURGERY  02/2002   nose  . NEPHRECTOMY  03/2007   left  . PROSTATE SURGERY  12/2003  . PROSTATE SURGERY  03/1998   surgery in prostate area  . targus  04/1998   to prostate  . TONSILLECTOMY     as  child  . TRANSURETHRAL RESECTION OF BLADDER TUMOR N/A 03/29/2014   Procedure: CYSTOSCOPY, CLOT EVACUATION, RIGHT RETROGRADE, TRANSURETHRAL RESECTION OF BLADDER TUMOR (TURBT);  Surgeon: Ardis Hughs, MD;  Location: [facility];  Service: Urology;  Laterality: N/A;  . TRANSURETHRAL RESECTION OF PROSTATE N/A 06/24/2012   Procedure: TRANSURETHRAL RESECTION OF THE PROSTATE (TURP);  Surgeon: Dutch Gray, MD;  Location: [facility];  Service: Urology;  Laterality: N/A;  CYSTOSCOPY, TRANSURETHRAL RESECTION OF PROSTATIC URETHRAL TUMOR   . URINARY SURGERY  03/1998   bleeding   HPI:  84 year old male admitted 02/15/17 after a fall at home. PMH significant for DM2, normocytic anemia, aortic stenosis, CKD, malnutrition, recent RSV/PNA. CXR = RML/LLL airspace opacities.   Assessment / Plan / Recommendation Clinical Impression  Pt presents with adequate oral motor strength and function. Adequate dentition. No overt s/s aspiration observed or reported on any consistency, and pt reports tolerance of regular solids and thin liquids without difficulty. Pt has few high risk indicators to raise suspicion for silent aspiration aside from advanced age. No history of stroke, COPD, Parkinsons, in [place]. At this time, recommend continuing with current diet (regular/thin). Consider MBS to objectively assess swallow function and safety and rule out silent aspiration if lungs do not clear in a timely manner, or if PNA becomes  recurrent. RN and pt aware and in agreement with recommendations. ST to sign off at this time. Please reconsult if needs arise.     SLP Visit Diagnosis: Dysphagia, unspecified (R13.10)    Aspiration Risk  Mild aspiration risk    Diet Recommendation Regular;Thin liquid   Liquid Administration via: Straw;Cup Medication Administration: Whole meds with liquid Supervision: Patient able to self feed Compensations: Minimize environmental distractions;Slow rate Postural Changes: Seated upright at 90 degrees     Other  Recommendations Oral Care Recommendations: Oral care BID       Prognosis Prognosis for Safe Diet Advancement: Good      Swallow Study   General Date of Onset: 02/15/17 HPI: 84 year old male admitted 02/15/17 after a fall at home. PMH significant for DM2, normocytic anemia, aortic stenosis, CKD, malnutrition, recent RSV/PNA. CXR = RML/LLL airspace opacities. Type of Study: Bedside Swallow Evaluation Previous Swallow Assessment: none found Diet Prior to this Study: Regular;Thin liquids Temperature Spikes Noted: No Respiratory Status: Nasal cannula History of Recent Intubation: No Behavior/Cognition: Alert;Cooperative;Pleasant mood Oral Cavity Assessment: Within Functional Limits Oral Care Completed by SLP: No Oral Cavity - Dentition: Adequate natural dentition Vision: Functional for self-feeding Self-Feeding Abilities: Able to feed self Patient Positioning: Upright in bed Baseline Vocal Quality: Normal Volitional Cough: Strong Volitional Swallow: Able to elicit    Oral/Motor/Sensory Function Overall Oral Motor/Sensory Function: Within functional limits   Ice Chips Ice chips: Not tested   Thin Liquid Thin Liquid: Within functional limits Presentation: Self Fed;Straw    Nectar Thick Nectar Thick Liquid: Not tested   Honey Thick Honey Thick Liquid: Not tested   Puree Puree: Within functional limits Presentation: Self Fed;Spoon   Solid   GO   Solid: Within functional limits Presentation: [place] [facility] [place], [facility], [place] Speech Language Pathologist [phone]  Shonna Chock 02/16/2017,11:38 AM

## 2017-02-16 NOTE — Evaluation (Addendum)
Physical Therapy Evaluation Patient Details Name: Alexander Barnett MRN: [id number] DOB: 02-Dec-1920 Today's Date: 02/16/2017   History of Present Illness  Pt was admitted for CAP vs HCAP and fall at home.  He was recently admitted for respiratory failure 2* pna.  PMH:  bladder CA, DM and CKD.    Clinical Impression  Pt admitted as above and presenting with functional mobility limitations 2* generalized weakness and mild ambulatory balance deficits.  Pt would benefit from follow up rehab at SNF level to maximize IND and safety prior to possible return home ALONE.  This date pt able to ambulate 692' in 6 min with RW and without rest breaks.  Pt able to  maintain SaO2 above 92% on RA but c/o LE fatigue on completion.    Follow Up Recommendations SNF    Equipment Recommendations  Rolling walker with 5" wheels    Recommendations for Other Services OT consult     Precautions / Restrictions Precautions Precautions: Fall Restrictions Weight Bearing Restrictions: No      Mobility  Bed Mobility Overal bed mobility: Modified Independent             General bed mobility comments: Increased time with use of bedrail  Transfers Overall transfer level: Needs assistance Equipment used: Rolling walker (2 wheeled) Transfers: Sit to/from Stand Sit to Stand: Min guard         General transfer comment: min cues for use of UEs to self assist.  Steady assist with initial standing  Ambulation/Gait Ambulation/Gait assistance: Min guard Ambulation Distance (Feet): 760 Feet Assistive device: Rolling walker (2 wheeled);None Gait Pattern/deviations: Step-through pattern;Decreased step length - right;Decreased step length - left;Shuffle;Trunk flexed Gait velocity: mod pace Gait velocity interpretation: Below normal speed for age/gender General Gait Details: cues for posture and position from [employer]            Wheelchair Mobility    Modified Rankin (Stroke Patients Only)        Balance Overall balance assessment: Needs assistance Sitting-balance support: No upper extremity supported;Feet supported Sitting balance-Leahy Scale: Good     Standing balance support: No upper extremity supported Standing balance-Leahy Scale: Fair                               Pertinent Vitals/Pain Pain Assessment: No/denies pain    Home Living Family/patient expects to be discharged to:: Skilled nursing facility Living Arrangements: Alone             Home Equipment: Kasandra Knudsen - single point Additional Comments: Pt states does not feel able to manage alone in current residence    Prior Function Level of Independence: Independent;Independent with assistive device(s)         Comments: using RW     Hand Dominance        Extremity/Trunk Assessment   Upper Extremity Assessment Upper Extremity Assessment: Overall WFL for tasks assessed    Lower Extremity Assessment Lower Extremity Assessment: Generalized weakness    Cervical / Trunk Assessment Cervical / Trunk Assessment: Kyphotic  Communication   Communication: HOH  Cognition Arousal/Alertness: Awake/alert Behavior During Therapy: WFL for tasks assessed/performed Overall Cognitive Status: Within Functional Limits for tasks assessed                                        General Comments  Exercises     Assessment/Plan    PT Assessment Patient needs continued PT services  PT Problem List Decreased strength;Decreased activity tolerance;Decreased balance;Decreased mobility;Decreased knowledge of use of DME       PT Treatment Interventions DME instruction;Gait training;Stair training;Functional mobility training;Therapeutic activities;Therapeutic exercise;Patient/family education;Balance training    PT Goals (Current goals can be found in the Care Plan section)  Acute Rehab PT Goals Patient Stated Goal: Regain IND and return home PT Goal Formulation: With patient Time  For Goal Achievement: 02/24/17 Potential to Achieve Goals: Good    Frequency Min 3X/week   Barriers to discharge Decreased caregiver support Home alone    Co-evaluation PT/OT/SLP Co-Evaluation/Treatment: Yes Reason for Co-Treatment: For patient/therapist safety PT goals addressed during session: Mobility/safety with mobility OT goals addressed during session: ADL's and self-care       AM-PAC PT "6 Clicks" Daily Activity  Outcome Measure Difficulty turning over in bed (including adjusting bedclothes, sheets and blankets)?: A Little Difficulty moving from lying on back to sitting on the side of the bed? : A Little Difficulty sitting down on and standing up from a chair with arms (e.g., wheelchair, bedside commode, etc,.)?: A Little Help needed moving to and from a bed to chair (including a wheelchair)?: A Little Help needed walking in hospital room?: A Little Help needed climbing 3-5 steps with a railing? : A Little 6 Click Score: 18    End of Session Equipment Utilized During Treatment: Gait belt Activity Tolerance: Patient tolerated treatment well Patient left: in chair;with call bell/phone within reach;with chair alarm set Nurse Communication: Mobility status PT Visit Diagnosis: Unsteadiness on feet (R26.81);Difficulty in walking, not elsewhere classified (R26.2);Muscle weakness (generalized) (M62.81)    Time: 5852-7782 PT Time Calculation (min) (ACUTE ONLY): 30 min   Charges:     PT Treatments $Gait Training: 8-22 mins   PT G Codes:        Pg [phone]   Patt Steinhardt 02/16/2017, 12:34 PM

## 2017-02-17 LAB — BASIC METABOLIC PANEL
Anion gap: 6 (ref 5–15)
BUN: 13 mg/dL (ref 6–20)
CALCIUM: 7.7 mg/dL — AB (ref 8.9–10.3)
CO2: 21 mmol/L — ABNORMAL LOW (ref 22–32)
CREATININE: 1.07 mg/dL (ref 0.61–1.24)
Chloride: 108 mmol/L (ref 101–111)
GFR calc non Af Amer: 57 mL/min — ABNORMAL LOW (ref >60)
Glucose, Bld: 82 mg/dL (ref 65–99)
Potassium: 3.9 mmol/L (ref 3.5–5.1)
SODIUM: 135 mmol/L (ref 135–145)

## 2017-02-17 LAB — CBC
HCT: 26.6 % — ABNORMAL LOW (ref 39.0–52.0)
Hemoglobin: 8.5 g/dL — ABNORMAL LOW (ref 13.0–17.0)
MCH: 26 pg (ref 26.0–34.0)
MCHC: 32 g/dL (ref 30.0–36.0)
MCV: 81.3 fL (ref 78.0–100.0)
Platelets: 419 10*3/uL — ABNORMAL HIGH (ref 150–400)
RBC: 3.27 MIL/uL — ABNORMAL LOW (ref 4.22–5.81)
RDW: 22.2 % — AB (ref 11.5–15.5)
WBC: 10 10*3/uL (ref 4.0–10.5)

## 2017-02-17 LAB — GLUCOSE, CAPILLARY
GLUCOSE-CAPILLARY: 93 mg/dL (ref 65–99)
GLUCOSE-CAPILLARY: 140 mg/dL — AB (ref 65–99)

## 2017-02-17 LAB — PROCALCITONIN: PROCALCITONIN: 0.12 ng/mL

## 2017-02-17 MED ORDER — ACETAMINOPHEN 325 MG PO TABS: 650.0000 mg | ORAL_TABLET | Freq: Four times a day (QID) | ORAL | 0 refills | Status: AC | PRN

## 2017-02-17 MED ORDER — GLUCERNA SHAKE PO LIQD: 237.0000 mL | Freq: Three times a day (TID) | ORAL | 0 refills | Status: AC

## 2017-02-17 MED ORDER — CEFPODOXIME PROXETIL 200 MG PO TABS
200.0000 mg | ORAL_TABLET | Freq: Two times a day (BID) | ORAL | Status: DC
  Filled 2017-02-17: qty 1

## 2017-02-17 MED ORDER — CEFPODOXIME PROXETIL 200 MG PO TABS: 200.0000 mg | ORAL_TABLET | Freq: Two times a day (BID) | ORAL | 0 refills | Status: AC

## 2017-02-17 NOTE — Progress Notes (Signed)
Pt discharged from the unit. Friend transported to [facility] SNF . No questions or concerns at this time.

## 2017-02-17 NOTE — NC FL2 (Signed)
[place] LEVEL OF CARE SCREENING TOOL     IDENTIFICATION  Patient Name: Alexander Barnett Birthdate: 29-Oct-1920 Sex: male Admission Date (Current Location): 02/15/2017  [facility] [place] Number:  Herbalist and Address:  [facility],  [place], [place]      Provider Number: [id number]  Attending Physician Name and Address:  Elodia Florence., *  Relative Name and Phone Number:       Current Level of Care: Hospital Recommended Level of Care: [place] Prior Approval Number:    Date Approved/Denied: 03/19/07 PASRR Number: [id number]  Discharge Plan: SNF    Current Diagnoses: Patient Active Problem List   Diagnosis Date Noted  . Malnutrition of moderate degree 02/09/2017  . RSV (respiratory syncytial virus pneumonia) 02/09/2017  . Acute hypoxemic respiratory failure ([place]) 02/09/2017  . DM type 2 (diabetes mellitus, type 2) ([place]) 02/09/2017  . Aortic atherosclerosis ([place]) 02/09/2017  . CAP (community acquired pneumonia) 02/08/2017  . MDS (myelodysplastic syndrome), low grade ([place]) 09/27/2016  . CKD (chronic kidney disease) 09/27/2016  . Pulmonary HTN ([place]) 12/23/2015  . Hematuria, gross 03/26/2014  . Bradycardia   . CKD (chronic kidney disease) stage 3, GFR 30-59 ml/min (HCC)   . Aortic stenosis   . Diastolic dysfunction   . Hypertension   . Arthritis   . History of skin cancer   . Hematuria   . Anemia   . Hx of transfusion   . History of kidney cancer   . Persistent atrial fibrillation ([place]) 06/17/2006    Orientation RESPIRATION BLADDER Height & Weight     Self, Place, Situation, Time  Normal Continent Weight: 137 lb 5.6 oz (62.3 kg) Height:  5\' 6"  (167.6 cm)  BEHAVIORAL SYMPTOMS/MOOD NEUROLOGICAL BOWEL NUTRITION STATUS      Continent Diet(Heart Healthy)  AMBULATORY STATUS COMMUNICATION OF NEEDS Skin   Limited Assist Verbally (Lacerations arm/right shin from fall)                    Personal Care Assistance Level of Assistance  Bathing, Dressing Bathing Assistance: Limited assistance   Dressing Assistance: Limited assistance     Functional Limitations Info             SPECIAL CARE FACTORS FREQUENCY  PT (By licensed PT), OT (By licensed OT)     PT Frequency: 5 OT Frequency: 5            Contractures Contractures Info: Not present    Additional Factors Info  Code Status, Allergies Code Status Info: DNR Allergies Info: Allergies: Amoxicillin, Ciprofloxacin           Current Medications (02/17/2017):  This is the current hospital active medication list Current Facility-Administered Medications  Medication Dose Route Frequency Provider Last Rate Last Dose  . 0.9 %  sodium chloride infusion   Intravenous Continuous Reyne Dumas, MD   Stopped at 02/17/17 1037  . acetaminophen (TYLENOL) tablet 650 mg  650 mg Oral Q6H PRN Reyne Dumas, MD   650 mg at 02/17/17 0452  . atenolol (TENORMIN) tablet 25 mg  25 mg Oral Daily Reyne Dumas, MD   25 mg at 02/17/17 1026  . cefpodoxime (VANTIN) tablet 200 mg  200 mg Oral Q12H Elodia Florence., MD      . cholecalciferol (VITAMIN D) tablet 1,000 Units  1,000 Units Oral Daily Reyne Dumas, MD   1,000 Units at 02/17/17 1026  . feeding supplement (GLUCERNA SHAKE) (  GLUCERNA SHAKE) liquid 237 mL  237 mL Oral TID BM Elodia Florence., MD   237 mL at 02/17/17 1026  . finasteride (PROSCAR) tablet 5 mg  5 mg Oral q morning - 10a Reyne Dumas, MD   5 mg at 02/17/17 1026  . guaiFENesin-dextromethorphan (ROBITUSSIN DM) 100-10 MG/5ML syrup 5 mL  5 mL Oral Q4H PRN Reyne Dumas, MD   5 mL at 02/17/17 1048  . heparin injection 5,000 Units  5,000 Units Subcutaneous Q8H Reyne Dumas, MD   5,000 Units at 02/16/17 0555  . insulin aspart (novoLOG) injection 0-9 Units  0-9 Units Subcutaneous TID WC Reyne Dumas, MD   1 Units at 02/16/17 1316  . methocarbamol (ROBAXIN) tablet 250 mg  250 mg Oral Q6H PRN Reyne Dumas, MD      . multivitamin (PROSIGHT) tablet 1 tablet  1 tablet Oral Daily Reyne Dumas, MD   1 tablet at 02/17/17 1026   Facility-Administered Medications Ordered in Other Encounters  Medication Dose Route Frequency Provider Last Rate Last Dose  . ferric carboxymaltose (INJECTAFER) 750 mg in sodium chloride 0.9 % 250 mL IVPB  750 mg Intravenous Once Brunetta Genera, MD         Discharge Medications: Please see discharge summary for a list of discharge medications.  Relevant Imaging Results:  Relevant Lab Results:   Additional Information [id number]  Alphonse Guild Lillias Difrancesco, LCSWA

## 2017-02-17 NOTE — Progress Notes (Signed)
CSW received a call from [place] at [facility] who stated he would private pay until Monday when Josem Kaufmann is obtained and Narda Rutherford states she will just seek auth without making pt pay, unless Josem Kaufmann is denied.  Pt is agreable to go in this manner, per [place].  CSW will faiclitate transport.  Alphonse Guild. Galen Russman, LCSW, LCAS, CSI Clinical Social Worker Ph: [phone]

## 2017-02-17 NOTE — Discharge Summary (Signed)
Physician Discharge Summary  Alexander Barnett KDX:[id number] DOB: 04/02/1920 DOA: 02/15/2017  PCP: Jonathon Jordan, MD  Admit date: 02/15/2017 Discharge date: 02/17/2017  Time spent: over 30 minutes  Recommendations for Outpatient Follow-up:  1. Follow up outpatient CBC/CMP 2. Follow up vitamin D level 3. Follow up final blood cultures 4. Follow up blood pressures as outpatient (amlodipine stopped here given positive orthostatics).  Continue to watch BP's while on atenolol and losartan.  5. Needs f/u with cards for worsening aortic stenosis 6. Continue abx for additional 5 days 7. Repeat CXR to ensure resolution of findings in 3-4 weeks    Discharge Diagnoses:  Principal Problem:   CAP (community acquired pneumonia) Active Problems:   Hypertension   Hematuria   Anemia   Hx of transfusion   History of kidney cancer   Persistent atrial fibrillation (HCC)   CKD (chronic kidney disease) stage 3, GFR 30-59 ml/min (HCC)   Diastolic dysfunction   Pulmonary HTN (HCC)   MDS (myelodysplastic syndrome), low grade ([place])   DM type 2 (diabetes mellitus, type 2) ([place])   Discharge Condition: stable  Diet recommendation: heart healthy  Filed Weights   02/15/17 0048 02/15/17 1012  Weight: 63.5 kg (140 lb) 62.3 kg (137 lb 5.6 oz)    History of present illness:  Per HPI 84 year old male with Alexander Barnett history of type 2 diabetes, normocytic anemia, aortic stenosis, chronic kidney disease, moderate malnutrition, who was recently admitted and discharged between 1/24-1/28 after being diagnosed with RSV/community-acquired pneumonia. Patient was discharged in 2-3 L of oxygen continuously for short-term use . Also discharged on cefuroxime after being treated with ceftriaxone and azithromycin.2-D echo showed worsening aortic stenosis with moderate to severe findings . Patient was discharged home 1/28 with home health. Patient presents today 1/31 after fall, after he tripped over Alexander Barnett chair. He lives by himself.  Patient normally uses Alexander Barnett walker at baseline. Today he fell on the floor and collapsed. He presented with back pain to the ED today. Patient has taken Tylenol which seems to have helped Alexander Barnett little. Patient continues to cough. Does not report any fever.  Hospital Course:  Acute on chronic hypoxic respiratory failure secondary to multifocal pneumonia - recently discharged on cefuroxime for pneumonia.  Pt also positive for RSV.  He represented with imaging findings c/w pneumonia and with persistent O2 requirement.  Started on cefepime and narrowed to vantin on day of discharge with plans for 5 additional days of therapy.  PCT downtrended to 0.12.  Walked with PT on 2/1 and able to maintain O2 sats >92%. Negative urine strep Bcx NGTD x 2 days CT chest with bilateral lower lobe airspace opacities compatible with pneumonia, small pleural effusions.  Mild interval loss of height of L1 vertebrae Patient may have an acute compression fracture.  No TTP on exam or LE weakness. [ ]  Follow vitamin D level Continue APAP for pain control PT OT recommending SNF  DM type 2 -CBG stable; continue SSI, will check hemoglobin A1c (5.0)  Fall/Dizziness: recentCT head negative.  He denies LH to me.  He notes the fall was more mechanical.  Pt with positive orthostatics on 2/1, but improved today.  Suspect this was contributing to fall.  Will d/c amlodipine for now.   - presume contribution from acute illness/aortic stenosis (with positive orthostatics at presentation, improved at discharge) - recent echo showed normal LVEF, grade 2 diastolic dysfunction, slight worsening of aortic stenosis - no further evaluation plannedbased on workup done during recent admission  -  will need follow up with cards (Dr. Radford Pax)  Chronic normocytic anemiafollowed by Dr. Irene Limbo. - Hgb stable, f/u as outpatient  Aortic stenosis  - echo showed slight worsening of aortic stenosis, now moderate to severe - outpatient f/u  CKD  stage III - creatinine stable  Leukocytosis: mild, likely 2/2 above, ctm  Procedures:  none   Consultations:  none  Discharge Exam: Vitals:   02/17/17 0520 02/17/17 1256  BP: 132/77 (!) 144/58  Pulse: 70 67  Resp: 17 18  Temp: 98.8 F (37.1 C) 97.9 F (36.6 C)  SpO2: 93% 96%   Feeling better. Alexander Barnett bit of Alexander Barnett sore throat Concerned about leaving too early since he's been readmitted.  Reassured that he seems to be improving with normal oxygenation (also per nursing, seems less confused than when at last admission).  Had long discussion to provide reassurance based on clinical picture, work with PT yesterday, and improving oxygenation.  General: No acute distress. Cardiovascular: Heart sounds show Mauricio Dahlen regular rate, and rhythm. No gallops or rubs. No murmurs. No JVD. Lungs: Clear to auscultation bilaterally with good air movement. No rales, rhonchi or wheezes. Abdomen: Soft, nontender, nondistended with normal active bowel sounds. No masses. No hepatosplenomegaly. Neurological: Alert and oriented 3. Moves all extremities 4. Cranial nerves II through XII grossly intact. Skin: Warm and dry. No rashes or lesions. Extremities: No clubbing or cyanosis. No edema.  Psychiatric: Mood and affect are normal. Insight and judgment are appropriate.   Discharge Instructions   Discharge Instructions    Call MD for:  difficulty breathing, headache or visual disturbances   Complete by:  As directed    Call MD for:  extreme fatigue   Complete by:  As directed    Call MD for:  persistant dizziness or light-headedness   Complete by:  As directed    Call MD for:  persistant nausea and vomiting   Complete by:  As directed    Call MD for:  redness, tenderness, or signs of infection (pain, swelling, redness, odor or green/yellow discharge around incision site)   Complete by:  As directed    Call MD for:  severe uncontrolled pain   Complete by:  As directed    Call MD for:  temperature >100.4    Complete by:  As directed    Diet - low sodium heart healthy   Complete by:  As directed    Discharge instructions   Complete by:  As directed    You were seen after Marissah Vandemark fall and found to have persistent pneumonia.  We are treating you with antibiotics.  You'll complete those at the SNF.  Take tylenol as needed for pain (you had Alexander Barnett compression fracture).  Your primary will need to follow up the vitamin D level.  Continue your vitamin D.  Ensure you eat Alexander Barnett diet rich in calcium containing foods (goal is 1200 mg Jennfier Abdulla day).  Return if you have new, recurrent, or worsening symptoms.  Ask your PCP to request records from this hospitalization so they know what was done and the next steps.  Follow up with cardiology for your aortic stenosis.   Increase activity slowly   Complete by:  As directed      Allergies as of 02/17/2017      Reactions   Amoxicillin Diarrhea   Has patient had Keyla Milone PCN reaction causing immediate rash, facial/tongue/throat swelling, SOB or lightheadedness with hypotension: no Has patient had Gomer France PCN reaction causing severe rash involving mucus membranes  or skin necrosis: no Has patient had Carlisa Eble PCN reaction that required hospitalization: unknown Has patient had Ido Wollman PCN reaction occurring within the last 10 years: no If all of the above answers are "NO", then may proceed with Cephalosporin use.   Ciprofloxacin Other (See Comments)   Muscle strain in groin      Medication List    STOP taking these medications   amLODipine 5 MG tablet Commonly known as:  NORVASC   cefUROXime 500 MG tablet Commonly known as:  CEFTIN     TAKE these medications   acetaminophen 325 MG tablet Commonly known as:  TYLENOL Take 2 tablets (650 mg total) by mouth every 6 (six) hours as needed for mild pain or moderate pain.   atenolol 25 MG tablet Commonly known as:  TENORMIN Take 1 tablet (25 mg total) by mouth daily. Patient needs to keep 03/02/17 appointment for further refills   cefpodoxime 200 MG  tablet Commonly known as:  VANTIN Take 1 tablet (200 mg total) by mouth every 12 (twelve) hours for 5 days. (first dose tonight 02/17/17)   cholecalciferol 1000 units tablet Commonly known as:  VITAMIN D Take 1,000 Units by mouth daily.   Co Q-10 100 MG Caps Take 1 capsule by mouth daily.   diphenoxylate-atropine 2.5-0.025 MG tablet Commonly known as:  LOMOTIL Take 1 tablet by mouth daily as needed for diarrhea or loose stools.   feeding supplement (GLUCERNA SHAKE) Liqd Take 237 mLs by mouth 3 (three) times daily between meals.   finasteride 5 MG tablet Commonly known as:  PROSCAR Take 5 mg by mouth every morning.   ICAPS AREDS 2 Caps Take 1 capsule by mouth daily.   losartan 100 MG tablet Commonly known as:  COZAAR Take 1 tablet (100 mg total) by mouth daily. Please keep upcoming appt with Dr.Turner in December. Thanks   multivitamin with minerals Tabs tablet Take 1 tablet by mouth daily.   travoprost (benzalkonium) 0.004 % ophthalmic solution Commonly known as:  TRAVATAN Place 1 drop into both eyes at bedtime.      Allergies  Allergen Reactions  . Amoxicillin Diarrhea    Has patient had Daryon Remmert PCN reaction causing immediate rash, facial/tongue/throat swelling, SOB or lightheadedness with hypotension: no Has patient had Adelina Collard PCN reaction causing severe rash involving mucus membranes or skin necrosis: no Has patient had Chong Wojdyla PCN reaction that required hospitalization: unknown Has patient had Katyana Trolinger PCN reaction occurring within the last 10 years: no If all of the above answers are "NO", then may proceed with Cephalosporin use.  . Ciprofloxacin Other (See Comments)    Muscle strain in groin   Follow-up Information    Jonathon Jordan, MD Follow up.   Specialty:  Family Medicine Contact information: [place] [address] [phone]        Sueanne Margarita, MD Follow up.   Specialty:  Cardiology Contact information: [address] [place] [address] [phone]            The results of significant diagnostics from this hospitalization (including imaging, microbiology, ancillary and laboratory) are listed below for reference.    Significant Diagnostic Studies: Dg Chest 2 View  Result Date: 02/15/2017 CLINICAL DATA:  Unwitnessed fall at home last night complains of mid low back pain, worse with movement, recent hx of pneumonia with ongoing cough, congestion, sob EXAM: CHEST  2 VIEW COMPARISON:  02/08/2017 FINDINGS: Retrocardiac airspace opacity, stable. Increase in airspace  opacity in the right middle lobe. Increase in indistinctness of the upper zone pulmonary vasculature. Atherosclerotic calcification of the aortic arch. Borderline enlargement of the cardiopericardial silhouette. Blunting of both costophrenic angles. Fullness of the right infrahilar region. IMPRESSION: 1. Right middle lobe and left lower lobe airspace opacities. Multilobar pneumonia not excluded. 2. Right infrahilar fullness could be from adenopathy or an infrahilar lesion. Consider chest CT. 3. Borderline enlargement of the cardiopericardial silhouette with cephalization of blood flow compatible with pulmonary venous hypertension. 4.  Aortic Atherosclerosis (ICD10-I70.0). Electronically Signed   By: Van Clines M.D.   On: 02/15/2017 08:14   Dg Chest 2 View  Result Date: 02/08/2017 CLINICAL DATA:  Anemia, low iron, dizziness and nausea beginning at 1500 hours, cough, congestion and crackles, history pulmonary hypertension, diabetes mellitus, stage III chronic kidney disease, diastolic dysfunction, transitional cell carcinoma of the bladder, prostate cancer, former smoker EXAM: CHEST  2 VIEW COMPARISON:  06/20/2012 FINDINGS: Enlargement of cardiac silhouette. Normal mediastinal contours and pulmonary vascularity. Atherosclerotic calcification aorta. Emphysematous and bronchitic changes with calcified granuloma in lower RIGHT chest. Atelectasis  versus consolidation in BILATERAL lower lobes greater on RIGHT. Remaining lungs clear. No acute infiltrate, pleural effusion or pneumothorax. Bones demineralized. IMPRESSION: Bibasilar atelectasis versus consolidation greater on RIGHT new since previous exam. Enlargement of cardiac silhouette with underlying emphysematous changes. Aortic atherosclerosis. Electronically Signed   By: Lavonia Dana M.D.   On: 02/08/2017 17:17   Dg Lumbar Spine Complete  Result Date: 02/15/2017 CLINICAL DATA:  84 y/o  M; fall onto back with lower back pain. EXAM: LUMBAR SPINE - COMPLETE 4+ VIEW COMPARISON:  03/10/2016 CT abdomen and pelvis. FINDINGS: Abdominal aortic calcific atherosclerosis. Mild lumbar levocurvature with apex at L3. Mild interval loss of height of L1 vertebral body. Moderate L2-3 and severe L3-L5 loss of intervertebral disc space height. Normal lumbar lordosis without listhesis. Lower lumbar facet arthrosis. IMPRESSION: 1. Mild interval loss of height of L1 vertebral body may represent acute compression deformity. 2. Stable lumbar spine spondylosis. 3. Aortic atherosclerosis. Electronically Signed   By: Kristine Garbe M.D.   On: 02/15/2017 02:29   Ct Head Wo Contrast  Result Date: 02/08/2017 CLINICAL DATA:  Dizziness and nausea EXAM: CT HEAD WITHOUT CONTRAST TECHNIQUE: Contiguous axial images were obtained from the base of the skull through the vertex without intravenous contrast. COMPARISON:  None. FINDINGS: Brain: No mass lesion, intraparenchymal hemorrhage or extra-axial collection. No evidence of acute cortical infarct. There is periventricular hypoattenuation compatible with chronic microvascular disease. Vascular: No hyperdense vessel or unexpected calcification. Skull: Normal visualized skull base, calvarium and extracranial soft tissues. Sinuses/Orbits: No sinus fluid levels or advanced mucosal thickening. No mastoid effusion. Normal orbits. IMPRESSION: Mild chronic microvascular changes  without acute abnormality. Electronically Signed   By: Ulyses Jarred M.D.   On: 02/08/2017 17:08   Ct Chest Wo Contrast  Result Date: 02/15/2017 CLINICAL DATA:  Cough.  Fall. EXAM: CT CHEST WITHOUT CONTRAST TECHNIQUE: Multidetector CT imaging of the chest was performed following the standard protocol without IV contrast. COMPARISON:  Chest radiograph 02/15/2017 FINDINGS: Cardiovascular: Normal heart size. Aortic atherosclerosis. Calcification in the LAD and left circumflex coronary arteries noted. No pericardial effusion. Mediastinum/Nodes: Calcified mediastinal and right hilar lymph nodes identified. The trachea appears patent and is midline. Normal appearance of the esophagus. No axillary or supraclavicular adenopathy. Lungs/Pleura: Small bilateral pleural effusions. Airspace consolidation is identified in both lung bases, left greater than right. Atelectasis noted in the right middle lobe and anterolateral right lower lobe.  Calcified granuloma identified within the right upper lobe. Upper Abdomen: Status post left nephrectomy. No acute findings noted within the upper abdomen. Musculoskeletal: The bones appear diffusely osteopenic. Chronic healed right lateral rib deformities identified. No acute displaced rib deformities. Mild scoliosis and degenerative disc disease. Age-indeterminate compression fractures are noted involving T9 and L1. The L1 compression fracture may be acute. The T9 fracture appears more chronic. IMPRESSION: 1. Bilateral lower lobe airspace opacities compatible with pneumonia. Small pleural effusions are noted. 2. T9 and L1 compression deformities. The L1 compression fracture may be acute. 3. Aortic Atherosclerosis (ICD10-I70.0). LAD and left circumflex coronary artery calcifications. Electronically Signed   By: Kerby Moors M.D.   On: 02/15/2017 10:01    Microbiology: Recent Results (from the past 240 hour(s))  Culture, blood (routine x 2) Call MD if unable to obtain prior to  antibiotics being given     Status: None   Collection Time: 02/08/17  8:50 PM  Result Value Ref Range Status   Specimen Description BLOOD LEFT ANTECUBITAL  Final   Special Requests   Final    BOTTLES DRAWN AEROBIC AND ANAEROBIC Blood Culture adequate volume   Culture   Final    NO GROWTH 5 DAYS Performed at [facility], [address], [place], [place] [address]    Report Status 02/13/2017 FINAL  Final  Respiratory Panel by PCR     Status: Abnormal   Collection Time: 02/08/17  8:53 PM  Result Value Ref Range Status   Adenovirus NOT DETECTED NOT DETECTED Final   Coronavirus 229E NOT DETECTED NOT DETECTED Final   Coronavirus HKU1 NOT DETECTED NOT DETECTED Final   Coronavirus NL63 NOT DETECTED NOT DETECTED Final   Coronavirus OC43 NOT DETECTED NOT DETECTED Final   Metapneumovirus NOT DETECTED NOT DETECTED Final   Rhinovirus / Enterovirus NOT DETECTED NOT DETECTED Final   Influenza Dollye Glasser NOT DETECTED NOT DETECTED Final   Influenza B NOT DETECTED NOT DETECTED Final   Parainfluenza Virus 1 NOT DETECTED NOT DETECTED Final   Parainfluenza Virus 2 NOT DETECTED NOT DETECTED Final   Parainfluenza Virus 3 NOT DETECTED NOT DETECTED Final   Parainfluenza Virus 4 NOT DETECTED NOT DETECTED Final   Respiratory Syncytial Virus DETECTED (Elener Custodio) NOT DETECTED Final    Comment: CRITICAL RESULT CALLED TO, READ BACK BY AND VERIFIED WITH: B HODGES RN 02/09/17 0554A JDW    Bordetella pertussis NOT DETECTED NOT DETECTED Final   Chlamydophila pneumoniae NOT DETECTED NOT DETECTED Final   Mycoplasma pneumoniae NOT DETECTED NOT DETECTED Final    Comment: Performed at [facility], [place] [address], [place], [place] [address]  Culture, blood (routine x 2) Call MD if unable to obtain prior to antibiotics being given     Status: None   Collection Time: 02/08/17  9:02 PM  Result Value Ref Range Status   Specimen Description BLOOD RIGHT ANTECUBITAL  Final   Special Requests   Final    BOTTLES DRAWN AEROBIC AND  ANAEROBIC Blood Culture adequate volume   Culture   Final    NO GROWTH 5 DAYS Performed at [facility], [place] [address] [place], [place] [address]    Report Status 02/13/2017 FINAL  Final  Culture, sputum-assessment     Status: None   Collection Time: 02/09/17  5:34 AM  Result Value Ref Range Status   Specimen Description SPUTUM  Final   Special Requests Normal  Final   Sputum evaluation THIS SPECIMEN IS ACCEPTABLE FOR  SPUTUM CULTURE  Final   Report Status 02/09/2017 FINAL  Final  Culture, respiratory (NON-Expectorated)     Status: None   Collection Time: 02/09/17  5:34 AM  Result Value Ref Range Status   Specimen Description SPUTUM  Final   Special Requests Normal Reflexed from [id number]  Final   Gram Stain   Final    ABUNDANT WBC PRESENT, PREDOMINANTLY PMN RARE SQUAMOUS EPITHELIAL CELLS PRESENT FEW GRAM POSITIVE COCCI IN CHAINS RARE GRAM NEGATIVE RODS RARE GRAM POSITIVE RODS    Culture   Final    FEW Consistent with normal respiratory flora. Performed at [facility], [place] [address] [place], [place] [address]    Report Status 02/11/2017 FINAL  Final  Culture, blood (routine x 2) Call MD if unable to obtain prior to antibiotics being given     Status: None (Preliminary result)   Collection Time: 02/15/17  9:10 AM  Result Value Ref Range Status   Specimen Description   Final    BLOOD LEFT ANTECUBITAL Performed at [facility] [address] [place], [place] [address]    Special Requests   Final    BOTTLES DRAWN AEROBIC AND ANAEROBIC Blood Culture adequate volume Performed at [facility] [address], [place], [place] [address]    Culture   Final    NO GROWTH 2 DAYS Performed at [facility] [address] [place], [place] [address]    Report Status PENDING  Incomplete  Culture, blood (routine x 2) Call MD if unable to obtain prior to antibiotics being given     Status: None (Preliminary result)   Collection  Time: 02/15/17  9:13 AM  Result Value Ref Range Status   Specimen Description   Final    BLOOD RIGHT HAND Performed at [facility] [address] [place], [place] [address]    Special Requests   Final    BOTTLES DRAWN AEROBIC ONLY Blood Culture adequate volume Performed at [facility] [address], [place], [place] [address]    Culture   Final    NO GROWTH 2 DAYS Performed at [facility] [address] [place], [place] [address]    Report Status PENDING  Incomplete  MRSA PCR Screening     Status: None   Collection Time: 02/16/17  9:43 AM  Result Value Ref Range Status   MRSA by PCR NEGATIVE NEGATIVE Final    Comment:        The GeneXpert MRSA Assay (FDA approved for NASAL specimens only), is one component of Jaylyn Booher comprehensive MRSA colonization surveillance program. It is not intended to diagnose MRSA infection nor to guide or monitor treatment for MRSA infections. Performed at [facility], [place] [address] [place], [place] [address]      Labs: Basic Metabolic Panel: Recent Labs  Lab 02/15/17 0622 02/16/17 0357 02/17/17 0439  NA 135 136 135  K 3.8 3.6 3.9  CL 107 108 108  CO2 22 23 21*  GLUCOSE 95 93 82  BUN 19 15 13   CREATININE 1.27* 1.17 1.07  CALCIUM 7.9* 7.6* 7.7*   Liver Function Tests: Recent Labs  Lab 02/15/17 0622 02/16/17 0357  AST 26 20  ALT 18 14*  ALKPHOS 71 64  BILITOT 0.6 0.4  PROT 6.3* 5.6*  ALBUMIN 3.0* 2.5*   No results for input(s): LIPASE, AMYLASE in the last 168 hours. No results for input(s): AMMONIA in the  last 168 hours. CBC: Recent Labs  Lab 02/14/17 1026 02/15/17 0622 02/16/17 0357 02/17/17 0439  WBC 8.3 9.9 12.0* 10.0  NEUTROABS 6.1 6.9  --   --   HGB  --  8.2* 8.1* 8.5*  HCT 27.4* 25.2* 25.1* 26.6*  MCV 83.3 82.6 81.5 81.3  PLT 322 331 391 419*   Cardiac Enzymes: No results for input(s): CKTOTAL, CKMB, CKMBINDEX, TROPONINI in the last 168  hours. BNP: BNP (last 3 results) No results for input(s): BNP in the last 8760 hours.  ProBNP (last 3 results) No results for input(s): PROBNP in the last 8760 hours.  CBG: Recent Labs  Lab 02/16/17 1153 02/16/17 1651 02/16/17 2149 02/17/17 0804 02/17/17 1131  GLUCAP 143* 93 97 93 140*       Signed:  Fayrene Helper MD.  [facility] 02/17/2017, 1:47 PM

## 2017-02-17 NOTE — Clinical Social Work Note (Signed)
Clinical Social Work Assessment  Patient Details  Name: Alexander Barnett MRN: [id number] Date of Birth: 1920/10/31  Date of referral:  02/15/17               Reason for consult:  Facility Placement                Permission sought to share information with:  Facility Art therapist granted to share information::  Yes, Verbal Permission Granted  Name::        Agency::     Relationship::     Contact Information:     Housing/Transportation Living arrangements for the past 2 months:  Apartment(Town Home) Source of Information:  Patient Patient Interpreter Needed:  None Criminal Activity/Legal Involvement Pertinent to Current Situation/Hospitalization:    Significant Relationships:  None Lives with:  Self Do you feel safe going back to the place where you live?    Need for family participation in patient care:     Care giving concerns:  Pt wants [facility]   Social Worker assessment / plan:  CSW met with pt and confirmed pt's plan to be discharged to SNF to live at discharge.  CSW provided active listening and validated pt's concerns.   CSW will complete FL-2 and send referrals out to SNF facilities via the hub per pt's request.  Pt has been living independently prior to being admitted to [facility].   Employment status:  Retired Nurse, adult PT Recommendations:  Home with [place] / Referral to community resources:  [place]  Patient/Family's Response to care:  Patient alert and oriented.  Patient agreeable to plan.    Pt pleasant and appreciated CSW intervention.     Patient/Family's Understanding of and Emotional Response to Diagnosis, Current Treatment, and Prognosis:  Pt understand current prognosis and treatment  Emotional Assessment Appearance:  Appears stated age Attitude/Demeanor/Rapport:    Affect (typically observed):  Pleasant, Accepting, Calm Orientation:  Oriented to Self, Oriented to Place,  Oriented to  Time, Oriented to Situation Alcohol / Substance use:    Psych involvement (Current and /or in the community):  No (Comment)  Discharge Needs  Concerns to be addressed:  No discharge needs identified Readmission within the last 30 days:  Yes Current discharge risk:  None Barriers to Discharge:  No Barriers Identified   Claudine Mouton, LCSWA 02/17/2017, 12:34 PM

## 2017-02-17 NOTE — Progress Notes (Signed)
CSW spoke to pt's friend and informed Cross,[facility]ith at [phone] that the pt is unlikely to be transported to [facility] due to the pt's ability to ambulate and Miss Elinor Parkinson said she is en route to pick the pt up.    CSW verified pt and Miss Elinor Parkinson will be assisted at the facilit once they arrive with getting out of the car and will update the  RN and confirm RN will facilitate help with getting pt into Miss Cross's car at D/C.  Please reconsult if future social work needs arise.  CSW signing off, as social work intervention is no longer needed.  Alphonse Guild. Cattaleya Wien, LCSW, LCAS, CSI Clinical Social Worker Ph: [phone]

## 2017-02-17 NOTE — Progress Notes (Signed)
CSW received a call from [place] has been accepted by: [facility] Number for report is: [phone] Pt's unit/room/bed number will be: [id number] Accepting physician: SNF MD   Pt can arrive ASAP on 02/17/17  Pt's friend Miss Elinor Parkinson will transport in her car. Please provide assistance with getting in the vehicle, RN stated she will facilitate.  CSW will update RN and EDP.  Alphonse Guild. Orvel Cutsforth, LCSW, LCAS, CSI Clinical Social Worker Ph: [phone]

## 2017-02-17 NOTE — Progress Notes (Addendum)
CSW received a call from pt's provider stating pt is ready for D/C.  Pt has not been worked up by social work nor prepared previously for D/C.  Pt's PASSR is: [id number]  CSW will continue to follow for D/C needs.  Alphonse Guild. Merrick Maggio, LCSW, LCAS, CSI Clinical Social Worker Ph: [phone]

## 2017-02-17 NOTE — Progress Notes (Signed)
Pt being discharged to [facility]. Discharge packet sent with pt. No questions or concerns at this time

## 2017-02-17 NOTE — Progress Notes (Signed)
CSW spoke to pt and receive permission to refer pt out to area facilities.  Pt prefers [facility].  CSW called Janie at [facility] at ph: [phone] and Narda Rutherford wil call back afte reviewing pt's case.  CSW will continue to follow for D/C needs.  Alphonse Guild. Allianna Beaubien, LCSW, LCAS, CSI Clinical Social Worker Ph: [phone]

## 2017-02-19 ENCOUNTER — Telehealth: Payer: Self-pay | Admitting: Hematology

## 2017-02-19 LAB — VITAMIN D 25 HYDROXY (VIT D DEFICIENCY, FRACTURES): Vit D, 25-Hydroxy: 29.5 ng/mL — ABNORMAL LOW (ref 30.0–100.0)

## 2017-02-19 NOTE — Telephone Encounter (Signed)
Patient left a voicemail to cancel 2/6

## 2017-02-20 LAB — CULTURE, BLOOD (ROUTINE X 2)
CULTURE: NO GROWTH
Culture: NO GROWTH
SPECIAL REQUESTS: ADEQUATE
Special Requests: ADEQUATE

## 2017-02-21 ENCOUNTER — Ambulatory Visit: Payer: Medicare Other

## 2017-02-21 ENCOUNTER — Other Ambulatory Visit: Payer: Medicare Other

## 2017-02-22 ENCOUNTER — Other Ambulatory Visit (HOSPITAL_COMMUNITY): Payer: Medicare Other

## 2017-02-23 LAB — LEGIONELLA PNEUMOPHILA SEROGP 1 UR AG: L. PNEUMOPHILA SEROGP 1 UR AG: NEGATIVE

## 2017-02-28 ENCOUNTER — Ambulatory Visit: Payer: Medicare Other

## 2017-02-28 ENCOUNTER — Other Ambulatory Visit: Payer: Medicare Other

## 2017-03-02 ENCOUNTER — Ambulatory Visit: Payer: Medicare Other | Admitting: Cardiology

## 2017-03-05 ENCOUNTER — Other Ambulatory Visit: Payer: Self-pay

## 2017-03-05 ENCOUNTER — Ambulatory Visit: Payer: Medicare Other | Admitting: Physician Assistant

## 2017-03-05 MED ORDER — LOSARTAN POTASSIUM 100 MG PO TABS: 100.0000 mg | ORAL_TABLET | Freq: Every day | ORAL | 3 refills | Status: AC

## 2017-03-07 ENCOUNTER — Other Ambulatory Visit: Payer: Medicare Other

## 2017-03-07 ENCOUNTER — Ambulatory Visit: Payer: Medicare Other

## 2017-03-07 MED ORDER — DARBEPOETIN ALFA 200 MCG/0.4ML IJ SOSY
PREFILLED_SYRINGE | INTRAMUSCULAR | Status: AC
  Filled 2017-03-07: qty 0.4

## 2017-03-14 ENCOUNTER — Ambulatory Visit: Payer: Medicare Other

## 2017-03-14 ENCOUNTER — Other Ambulatory Visit: Payer: Medicare Other

## 2017-03-15 ENCOUNTER — Inpatient Hospital Stay (HOSPITAL_COMMUNITY)
Admission: EM | Admit: 2017-03-15 | Discharge: 2017-04-16 | DRG: 853 | Disposition: E | Payer: Medicare Other | Attending: Internal Medicine | Admitting: Internal Medicine

## 2017-03-15 ENCOUNTER — Emergency Department (HOSPITAL_COMMUNITY): Payer: Medicare Other

## 2017-03-15 ENCOUNTER — Other Ambulatory Visit: Payer: Self-pay

## 2017-03-15 ENCOUNTER — Encounter (HOSPITAL_COMMUNITY): Payer: Self-pay | Admitting: Emergency Medicine

## 2017-03-15 DIAGNOSIS — K759 Inflammatory liver disease, unspecified: Secondary | ICD-10-CM | POA: Diagnosis not present

## 2017-03-15 DIAGNOSIS — Y95 Nosocomial condition: Secondary | ICD-10-CM | POA: Diagnosis present

## 2017-03-15 DIAGNOSIS — N179 Acute kidney failure, unspecified: Secondary | ICD-10-CM | POA: Diagnosis not present

## 2017-03-15 DIAGNOSIS — Z6822 Body mass index (BMI) 22.0-22.9, adult: Secondary | ICD-10-CM

## 2017-03-15 DIAGNOSIS — J948 Other specified pleural conditions: Secondary | ICD-10-CM | POA: Diagnosis present

## 2017-03-15 DIAGNOSIS — E872 Acidosis: Secondary | ICD-10-CM | POA: Diagnosis not present

## 2017-03-15 DIAGNOSIS — Z7401 Bed confinement status: Secondary | ICD-10-CM

## 2017-03-15 DIAGNOSIS — H9190 Unspecified hearing loss, unspecified ear: Secondary | ICD-10-CM | POA: Diagnosis present

## 2017-03-15 DIAGNOSIS — I4819 Other persistent atrial fibrillation: Secondary | ICD-10-CM | POA: Diagnosis present

## 2017-03-15 DIAGNOSIS — E440 Moderate protein-calorie malnutrition: Secondary | ICD-10-CM | POA: Diagnosis present

## 2017-03-15 DIAGNOSIS — Z888 Allergy status to other drugs, medicaments and biological substances status: Secondary | ICD-10-CM

## 2017-03-15 DIAGNOSIS — I481 Persistent atrial fibrillation: Secondary | ICD-10-CM | POA: Diagnosis not present

## 2017-03-15 DIAGNOSIS — Z66 Do not resuscitate: Secondary | ICD-10-CM | POA: Diagnosis present

## 2017-03-15 DIAGNOSIS — D4620 Refractory anemia with excess of blasts, unspecified: Secondary | ICD-10-CM | POA: Diagnosis present

## 2017-03-15 DIAGNOSIS — G9341 Metabolic encephalopathy: Secondary | ICD-10-CM | POA: Diagnosis not present

## 2017-03-15 DIAGNOSIS — I129 Hypertensive chronic kidney disease with stage 1 through stage 4 chronic kidney disease, or unspecified chronic kidney disease: Secondary | ICD-10-CM | POA: Diagnosis present

## 2017-03-15 DIAGNOSIS — R627 Adult failure to thrive: Secondary | ICD-10-CM | POA: Diagnosis present

## 2017-03-15 DIAGNOSIS — E860 Dehydration: Secondary | ICD-10-CM | POA: Diagnosis present

## 2017-03-15 DIAGNOSIS — R0602 Shortness of breath: Secondary | ICD-10-CM | POA: Diagnosis present

## 2017-03-15 DIAGNOSIS — A0472 Enterocolitis due to Clostridium difficile, not specified as recurrent: Secondary | ICD-10-CM | POA: Diagnosis not present

## 2017-03-15 DIAGNOSIS — I5189 Other ill-defined heart diseases: Secondary | ICD-10-CM | POA: Diagnosis present

## 2017-03-15 DIAGNOSIS — J189 Pneumonia, unspecified organism: Secondary | ICD-10-CM | POA: Diagnosis present

## 2017-03-15 DIAGNOSIS — N183 Chronic kidney disease, stage 3 unspecified: Secondary | ICD-10-CM | POA: Diagnosis present

## 2017-03-15 DIAGNOSIS — Z79899 Other long term (current) drug therapy: Secondary | ICD-10-CM

## 2017-03-15 DIAGNOSIS — E875 Hyperkalemia: Secondary | ICD-10-CM | POA: Diagnosis not present

## 2017-03-15 DIAGNOSIS — R945 Abnormal results of liver function studies: Secondary | ICD-10-CM | POA: Diagnosis present

## 2017-03-15 DIAGNOSIS — M1990 Unspecified osteoarthritis, unspecified site: Secondary | ICD-10-CM | POA: Diagnosis present

## 2017-03-15 DIAGNOSIS — D62 Acute posthemorrhagic anemia: Secondary | ICD-10-CM | POA: Diagnosis not present

## 2017-03-15 DIAGNOSIS — I519 Heart disease, unspecified: Secondary | ICD-10-CM

## 2017-03-15 DIAGNOSIS — K921 Melena: Secondary | ICD-10-CM | POA: Diagnosis not present

## 2017-03-15 DIAGNOSIS — E1122 Type 2 diabetes mellitus with diabetic chronic kidney disease: Secondary | ICD-10-CM | POA: Diagnosis present

## 2017-03-15 DIAGNOSIS — J869 Pyothorax without fistula: Secondary | ICD-10-CM

## 2017-03-15 DIAGNOSIS — J9601 Acute respiratory failure with hypoxia: Secondary | ICD-10-CM | POA: Diagnosis not present

## 2017-03-15 DIAGNOSIS — I350 Nonrheumatic aortic (valve) stenosis: Secondary | ICD-10-CM

## 2017-03-15 DIAGNOSIS — N390 Urinary tract infection, site not specified: Secondary | ICD-10-CM | POA: Diagnosis not present

## 2017-03-15 DIAGNOSIS — Z8546 Personal history of malignant neoplasm of prostate: Secondary | ICD-10-CM

## 2017-03-15 DIAGNOSIS — Z938 Other artificial opening status: Secondary | ICD-10-CM

## 2017-03-15 DIAGNOSIS — Z9842 Cataract extraction status, left eye: Secondary | ICD-10-CM

## 2017-03-15 DIAGNOSIS — Z85528 Personal history of other malignant neoplasm of kidney: Secondary | ICD-10-CM

## 2017-03-15 DIAGNOSIS — A419 Sepsis, unspecified organism: Secondary | ICD-10-CM | POA: Diagnosis not present

## 2017-03-15 DIAGNOSIS — D46Z Other myelodysplastic syndromes: Secondary | ICD-10-CM | POA: Diagnosis present

## 2017-03-15 DIAGNOSIS — J90 Pleural effusion, not elsewhere classified: Secondary | ICD-10-CM | POA: Diagnosis present

## 2017-03-15 DIAGNOSIS — I272 Pulmonary hypertension, unspecified: Secondary | ICD-10-CM | POA: Diagnosis present

## 2017-03-15 DIAGNOSIS — Z87891 Personal history of nicotine dependence: Secondary | ICD-10-CM

## 2017-03-15 DIAGNOSIS — Z905 Acquired absence of kidney: Secondary | ICD-10-CM

## 2017-03-15 DIAGNOSIS — Z9841 Cataract extraction status, right eye: Secondary | ICD-10-CM

## 2017-03-15 DIAGNOSIS — Z961 Presence of intraocular lens: Secondary | ICD-10-CM | POA: Diagnosis present

## 2017-03-15 DIAGNOSIS — Z515 Encounter for palliative care: Secondary | ICD-10-CM | POA: Diagnosis present

## 2017-03-15 DIAGNOSIS — K529 Noninfective gastroenteritis and colitis, unspecified: Secondary | ICD-10-CM

## 2017-03-15 DIAGNOSIS — N400 Enlarged prostate without lower urinary tract symptoms: Secondary | ICD-10-CM | POA: Diagnosis present

## 2017-03-15 DIAGNOSIS — D469 Myelodysplastic syndrome, unspecified: Secondary | ICD-10-CM | POA: Diagnosis present

## 2017-03-15 DIAGNOSIS — Z85828 Personal history of other malignant neoplasm of skin: Secondary | ICD-10-CM

## 2017-03-15 DIAGNOSIS — R000 Tachycardia, unspecified: Secondary | ICD-10-CM | POA: Diagnosis not present

## 2017-03-15 LAB — CBC WITH DIFFERENTIAL/PLATELET
BASOS ABS: 0 10*3/uL (ref 0.0–0.1)
Basophils Relative: 0 %
EOS ABS: 0 10*3/uL (ref 0.0–0.7)
Eosinophils Relative: 0 %
HCT: 29.5 % — ABNORMAL LOW (ref 39.0–52.0)
HEMOGLOBIN: 9.6 g/dL — AB (ref 13.0–17.0)
LYMPHS PCT: 4 %
Lymphs Abs: 1.4 10*3/uL (ref 0.7–4.0)
MCH: 25.9 pg — AB (ref 26.0–34.0)
MCHC: 32.5 g/dL (ref 30.0–36.0)
MCV: 79.5 fL (ref 78.0–100.0)
MONOS PCT: 3 %
Monocytes Absolute: 1.1 10*3/uL — ABNORMAL HIGH (ref 0.1–1.0)
NEUTROS ABS: 33.2 10*3/uL — AB (ref 1.7–7.7)
NEUTROS PCT: 93 %
PLATELETS: 349 10*3/uL (ref 150–400)
RBC: 3.71 MIL/uL — AB (ref 4.22–5.81)
RDW: 21.4 % — ABNORMAL HIGH (ref 11.5–15.5)
WBC: 35.7 10*3/uL — AB (ref 4.0–10.5)

## 2017-03-15 LAB — COMPREHENSIVE METABOLIC PANEL
ALT: 772 U/L — ABNORMAL HIGH (ref 17–63)
ANION GAP: 8 (ref 5–15)
AST: 1193 U/L — AB (ref 15–41)
Albumin: 2.2 g/dL — ABNORMAL LOW (ref 3.5–5.0)
Alkaline Phosphatase: 111 U/L (ref 38–126)
BILIRUBIN TOTAL: 0.9 mg/dL (ref 0.3–1.2)
BUN: 64 mg/dL — ABNORMAL HIGH (ref 6–20)
CHLORIDE: 112 mmol/L — AB (ref 101–111)
CO2: 19 mmol/L — ABNORMAL LOW (ref 22–32)
Calcium: 7.9 mg/dL — ABNORMAL LOW (ref 8.9–10.3)
Creatinine, Ser: 2.92 mg/dL — ABNORMAL HIGH (ref 0.61–1.24)
GFR, EST AFRICAN AMERICAN: 20 mL/min — AB (ref >60)
GFR, EST NON AFRICAN AMERICAN: 17 mL/min — AB (ref >60)
Glucose, Bld: 104 mg/dL — ABNORMAL HIGH (ref 65–99)
POTASSIUM: 5.5 mmol/L — AB (ref 3.5–5.1)
Sodium: 139 mmol/L (ref 135–145)
TOTAL PROTEIN: 6.4 g/dL — AB (ref 6.5–8.1)

## 2017-03-15 LAB — PROTIME-INR
INR: 1.38
PROTHROMBIN TIME: 16.9 s — AB (ref 11.4–15.2)

## 2017-03-15 LAB — I-STAT CG4 LACTIC ACID, ED
Lactic Acid, Venous: 1.88 mmol/L (ref 0.5–1.9)
Lactic Acid, Venous: 1.69 mmol/L (ref 0.5–1.9)

## 2017-03-15 LAB — LIPASE, BLOOD: LIPASE: 48 U/L (ref 11–51)

## 2017-03-15 LAB — PROCALCITONIN: Procalcitonin: 18.11 ng/mL

## 2017-03-15 LAB — I-STAT TROPONIN, ED: TROPONIN I, POC: 0.03 ng/mL (ref 0.00–0.08)

## 2017-03-15 MED ORDER — VANCOMYCIN HCL 500 MG IV SOLR: 500.0000 mg | INTRAVENOUS | Status: DC

## 2017-03-15 MED ORDER — ICAPS AREDS 2 PO CAPS: 1.0000 | ORAL_CAPSULE | Freq: Every day | ORAL | Status: DC

## 2017-03-15 MED ORDER — SODIUM CHLORIDE 0.9 % IV SOLN
1.0000 g | INTRAVENOUS | Status: DC
  Administered 2017-03-16 – 2017-03-18 (×3): 1 g via INTRAVENOUS
  Filled 2017-03-15 (×4): qty 1

## 2017-03-15 MED ORDER — SACCHAROMYCES BOULARDII 250 MG PO CAPS
250.0000 mg | ORAL_CAPSULE | Freq: Two times a day (BID) | ORAL | Status: DC
  Administered 2017-03-15 – 2017-03-21 (×10): 250 mg via ORAL
  Filled 2017-03-15 (×11): qty 1

## 2017-03-15 MED ORDER — LOSARTAN POTASSIUM 50 MG PO TABS: 100.0000 mg | ORAL_TABLET | Freq: Every day | ORAL | Status: DC

## 2017-03-15 MED ORDER — PROSIGHT PO TABS
1.0000 | ORAL_TABLET | Freq: Every day | ORAL | Status: DC
  Administered 2017-03-16 – 2017-03-21 (×5): 1 via ORAL
  Filled 2017-03-15 (×5): qty 1

## 2017-03-15 MED ORDER — ACETAMINOPHEN 325 MG PO TABS
650.0000 mg | ORAL_TABLET | Freq: Four times a day (QID) | ORAL | Status: DC | PRN
  Administered 2017-03-19 – 2017-03-21 (×3): 650 mg via ORAL
  Filled 2017-03-15 (×4): qty 2

## 2017-03-15 MED ORDER — SODIUM CHLORIDE 0.9 % IV BOLUS (SEPSIS)
500.0000 mL | Freq: Once | INTRAVENOUS | Status: AC
  Administered 2017-03-15: 500 mL via INTRAVENOUS

## 2017-03-15 MED ORDER — SODIUM CHLORIDE 0.9 % IV SOLN
INTRAVENOUS | Status: AC
  Administered 2017-03-15: 19:00:00 via INTRAVENOUS

## 2017-03-15 MED ORDER — GLUCERNA SHAKE PO LIQD
237.0000 mL | Freq: Three times a day (TID) | ORAL | Status: DC
  Administered 2017-03-16: 237 mL via ORAL
  Filled 2017-03-15 (×2): qty 237

## 2017-03-15 MED ORDER — ADULT MULTIVITAMIN W/MINERALS CH
1.0000 | ORAL_TABLET | Freq: Every day | ORAL | Status: DC
  Administered 2017-03-16 – 2017-03-21 (×5): 1 via ORAL
  Filled 2017-03-15 (×5): qty 1

## 2017-03-15 MED ORDER — SODIUM CHLORIDE 0.9 % IV SOLN
1.0000 g | Freq: Once | INTRAVENOUS | Status: AC
  Administered 2017-03-15: 1 g via INTRAVENOUS
  Filled 2017-03-15: qty 1

## 2017-03-15 MED ORDER — VANCOMYCIN HCL 10 G IV SOLR
1250.0000 mg | Freq: Once | INTRAVENOUS | Status: AC
  Administered 2017-03-15: 1250 mg via INTRAVENOUS
  Filled 2017-03-15: qty 1250

## 2017-03-15 MED ORDER — FIDAXOMICIN 200 MG PO TABS
200.0000 mg | ORAL_TABLET | Freq: Two times a day (BID) | ORAL | Status: DC
  Administered 2017-03-15 – 2017-03-19 (×7): 200 mg via ORAL
  Filled 2017-03-15 (×8): qty 1

## 2017-03-15 MED ORDER — MIRTAZAPINE 15 MG PO TABS
7.5000 mg | ORAL_TABLET | Freq: Every day | ORAL | Status: DC
  Administered 2017-03-15 – 2017-03-20 (×5): 7.5 mg via ORAL
  Filled 2017-03-15 (×7): qty 1

## 2017-03-15 NOTE — Progress Notes (Signed)
PULMONARY / CRITICAL CARE MEDICINE   Name: Alexander Barnett MRN: [id number] DOB: 1920-03-18 PCP Jonathon Jordan, MD LOS 0 as of 03/14/2017     ADMISSION DATE:  02/23/2017 CONSULTATION DATE:  03/11/2017   REFERRING MD:  Raelene Bott DAVID  CHIEF COMPLAINT:  empyema  HISTORY OF PRESENT ILLNESS:    History is gained by talking to the patient, his daughter and also review off tried hospitalist records.  There is a 84 year old male who apparently in summer of 2018 had C. difficile colitis. Recently in Fabry 2019 he had community-acquired pneumonia and was discharged to rehabilitation. This was in February 2019. He was discharged on antibiotics. It's unclear how long he has been in the rehabilitation but I suspect for at least a month or much of this month. Then beginning approximately 2-3 weeks ago he started deteriorating in terms of lost energy, increased fatigue and decreased appetite, decreased motivation and generalized failure to thrive. Patient himself narrated to the hospital is that his only complaint is shortness of breath getting worse. It's unclear if he's been having low-grade fevers. Upon admission to the hospital it was found that he had a high white count of 35,000 and a CT scan of the chest that showed right-sided loculated effusion and therefore pulmonary has been consulted.  Patient is a DO NOT RESUSCITATE. In talking to Dr. Juliann Pulse who is in [place] she emphasized that the main focuses that patient should be comfortable. She was confused if thoracentesis would be considered invasive. She went back and forth about thoracentesis. She said that she had to talk to her brother the patient's son about it. And after this she also wanted the pulmonary team to talk to the patient's son. She also wanted me to get the patient's opinion about it. Patient and his limited fashion told me "dog do what you  got to do". The family is clearly not willing for surgery   PAST MEDICAL HISTORY :  He  has a past  medical history of Anemia, Aortic stenosis, Arthritis, BPH (benign prostatic hyperplasia), Bradycardia, CKD (chronic kidney disease) stage 3, GFR 30-59 ml/min ([place]), Diabetes mellitus without complication ([place]), Diastolic dysfunction, Diverticulosis of colon, Erectile dysfunction, Hematuria, History of kidney cancer, History of skin cancer, transfusion, Hypertension, Persistent atrial fibrillation ([place]) (06/2006), Prostate cancer ([place]), Pulmonary HTN ([place]) (12/23/2015), and Transitional cell carcinoma of bladder ([place]).  PAST SURGICAL HISTORY: He  has a past surgical history that includes Tonsillectomy; Hernia repair (05/2002); Eye surgery (02/2004); Mohs surgery (02/2002); Nephrectomy (03/2007); Prostate surgery (12/2003); targus (04/1998); imrt radiation (04/2006); Prostate surgery (03/1998); Urinary surgery (03/1998); hyperbaric treatments; Transurethral resection of prostate (N/A, 06/24/2012); Cystoscopy (N/A, 06/24/2012); Cystoscopy (N/A, 08/05/2012); and Transurethral resection of bladder tumor (N/A, 03/29/2014).  Allergies  Allergen Reactions  . Amoxicillin Diarrhea    Has patient had a PCN reaction causing immediate rash, facial/tongue/throat swelling, SOB or lightheadedness with hypotension: no Has patient had a PCN reaction causing severe rash involving mucus membranes or skin necrosis: no Has patient had a PCN reaction that required hospitalization: unknown Has patient had a PCN reaction occurring within the last 10 years: no If all of the above answers are "NO", then may proceed with Cephalosporin use.  . Ciprofloxacin Other (See Comments)    Muscle strain in groin    Current Facility-Administered Medications on File Prior to Encounter  Medication  . ferric carboxymaltose (INJECTAFER) 750 mg in sodium chloride 0.9 % 250 mL IVPB   Current Outpatient Medications on File Prior to Encounter  Medication  Sig  . acetaminophen (TYLENOL) 325 MG tablet Take 2 tablets (650 mg total) by mouth every 6  (six) hours as needed for mild pain or moderate pain.  Marland Kitchen atenolol (TENORMIN) 25 MG tablet Take 1 tablet (25 mg total) by mouth daily. Patient needs to keep 03/02/17 appointment for further refills  . cholecalciferol (VITAMIN D) 1000 UNITS tablet Take 1,000 Units by mouth daily.  . Coenzyme Q10 (CO Q-10) 100 MG CAPS Take 100 mg by mouth daily.   . diphenoxylate-atropine (LOMOTIL) 2.5-0.025 MG tablet Take 1 tablet by mouth daily as needed for diarrhea or loose stools.  . feeding supplement, GLUCERNA SHAKE, (GLUCERNA SHAKE) LIQD Take 237 mLs by mouth 3 (three) times daily between meals.  . finasteride (PROSCAR) 5 MG tablet Take 5 mg by mouth every morning.   Marland Kitchen losartan (COZAAR) 100 MG tablet Take 1 tablet (100 mg total) by mouth daily.  . magnesium hydroxide (MILK OF MAGNESIA) 400 MG/5ML suspension Take 30 mLs by mouth daily as needed for mild constipation.  . mirtazapine (REMERON) 15 MG tablet Take 7.5 mg by mouth at bedtime.  . Multiple Vitamin (MULTIVITAMIN WITH MINERALS) TABS Take 1 tablet by mouth daily.  . Multiple Vitamins-Minerals (ICAPS AREDS 2) CAPS Take 1 capsule by mouth daily.  Marland Kitchen saccharomyces boulardii (FLORASTOR) 250 MG capsule Take 250 mg by mouth 2 (two) times daily.  . travoprost, benzalkonium, (TRAVATAN) 0.004 % ophthalmic solution Place 1 drop into both eyes at bedtime.    FAMILY HISTORY:  His indicated that his mother is deceased. He indicated that his father is deceased. He indicated that his maternal grandmother is deceased. He indicated that his maternal grandfather is deceased. He indicated that his paternal grandmother is deceased. He indicated that his paternal grandfather is deceased. He indicated that his maternal aunt is deceased. He indicated that his maternal uncle is deceased. He indicated that his paternal aunt is deceased. He indicated that his paternal uncle is deceased.   SOCIAL HISTORY: He  reports that he quit smoking about 54 years ago. His smoking use  included cigarettes. He has a 30.00 pack-year smoking history. he has never used smokeless tobacco. He reports that he drinks about 2.4 oz of alcohol per week. He reports that he does not use drugs.     VITAL SIGNS: BP 110/79   Pulse (!) 109   Temp (!) 97.5 F (36.4 C)   Resp (!) 32   Wt 62.3 kg (137 lb 5.6 oz)   SpO2 100%   BMI 22.17 kg/m   HEMODYNAMICS:    VENTILATOR SETTINGS:    INTAKE / OUTPUT: No intake/output data recorded.     EXAM  General Appearance:    Comfortably resting in bed. Looks a bit frail but otherwise in no distress   Head:    Normocephalic, without obvious abnormality, atraumatic  Eyes:    PERRL - equal, conjunctiva/corneas - clear      Ears:    Normal external ear canals, both ears  Nose:   NG tube - no but has nasal cannula oxygen   Throat:  ETT TUBE - no , OG tube - no   Neck:   Supple,  No enlargement/tenderness/nodules     Lungs:     Clear to auscultation bilaterally, diminished on the right   Chest wall:    No deformity  Heart:    S1 and S2 normal, no murmur, CVP - no.  Pressors - no   Abdomen:     Soft,  no masses, no organomegaly  Genitalia:    Not done  Rectal:   not done  Extremities:   Extremities- no cyanosis no clubbing no edema      Skin:   Intact in exposed areas .     Neurologic:  He is alert . Moves all 4s - yes. CAM-ICU - not tested . Orientation - partially oriented fully oriented not tested but able to follow commands and interact        LABS  PULMONARY No results for input(s): PHART, PCO2ART, PO2ART, HCO3, TCO2, O2SAT in the last 168 hours.  Invalid input(s): PCO2, PO2  CBC Recent Labs  Lab 02/21/2017 1106  HGB 9.6*  HCT 29.5*  WBC 35.7*  PLT 349    COAGULATION Recent Labs  Lab 02/28/2017 1106  INR 1.38    CARDIAC  No results for input(s): TROPONINI in the last 168 hours. No results for input(s): PROBNP in the last 168 hours.   CHEMISTRY Recent Labs  Lab 03/01/2017 1106  NA 139  K 5.5*  CL 112*   CO2 19*  GLUCOSE 104*  BUN 64*  CREATININE 2.92*  CALCIUM 7.9*   Estimated Creatinine Clearance: 13.6 mL/min (A) (by C-G formula based on SCr of 2.92 mg/dL (H)).   LIVER Recent Labs  Lab 03/13/2017 1106  AST 1,193*  ALT 772*  ALKPHOS 111  BILITOT 0.9  PROT 6.4*  ALBUMIN 2.2*  INR 1.38     INFECTIOUS Recent Labs  Lab 03/01/2017 1112 03/03/2017 1303  LATICACIDVEN 1.88 1.69     ENDOCRINE CBG (last 3)  No results for input(s): GLUCAP in the last 72 hours.       IMAGING x48h  - image(s) personally visualized  -   highlighted in bold Ct Abdomen Pelvis Wo Contrast  Result Date: 02/26/2017 CLINICAL DATA:  Worsening shortness of breath, abnormal chest radiograph with question pneumonia, leukocytosis, sepsis, abnormal LFTs, history C difficile colitis, pulmonary hypertension, persistent atrial fibrillation, diabetes mellitus, prostate cancer, low-grade myelodysplastic syndrome, former smoker EXAM: CT CHEST, ABDOMEN AND PELVIS WITHOUT CONTRAST TECHNIQUE: Multidetector CT imaging of the chest, abdomen and pelvis was performed following the standard protocol without IV contrast. Sagittal and coronal MPR images reconstructed from axial data set. No oral contrast was administered. COMPARISON:  CT chest 02/15/2017, CT abdomen and pelvis 03/10/2016, lumbar spine radiographs 02/15/2017 FINDINGS: CT CHEST FINDINGS Cardiovascular: Atherosclerotic calcifications aorta, proximal great vessels and coronary arteries. Aorta normal caliber. Significant aortic valvular calcification. No pericardial effusion. Enlarged central pulmonary arteries consistent with history of pulmonary arterial hypertension. Mediastinum/Nodes: Calcified lymph nodes RIGHT hilum and mediastinum consistent with old granulomatous disease. Esophagus unremarkable. No enlarged thoracic lymph nodes. Base of cervical region unremarkable. Lungs/Pleura: Atelectasis and peribronchial thickening in LEFT lower lobe. No LEFT pleural  effusion. Moderate RIGHT pleural effusion which appears at least partially loculated. Foci of gas are seen within the RIGHT hemithorax as well consistent with hydropneumothorax. Calcified granulomata RIGHT lung. Compressive atelectasis of the RIGHT lung particularly RIGHT lower lobe to lesser degree posterior aspects of the RIGHT upper and RIGHT middle lobes. No additional pulmonary mass. No definite endobronchial lesions. Musculoskeletal: Osseous demineralization. Old RIGHT rib fractures. Chronic appearing height losses of several thoracic vertebra. CT ABDOMEN PELVIS FINDINGS Hepatobiliary: Gallbladder contracted.  Liver unremarkable. Pancreas: Normal appearance Spleen: Atrophic Adrenals/Urinary Tract: Adrenal glands unremarkable. Absent LEFT kidney. Probable small RIGHT renal cysts without gross hydronephrosis or solid mass the ureters and bladder unremarkable. Stomach/Bowel: Normal appendix. Scattered colonic diverticulosis without evidence of diverticulitis. Rectosigmoid  wall thickening with stranding of perirectal fat into presacral space. Stomach and bowel loops otherwise normal appearance. Vascular/Lymphatic: Atherosclerotic calcifications without aneurysm. Tortuous iliac arteries with atherosclerotic calcification extending into common femoral arteries. No adenopathy. Reproductive: Minimal prostatic enlargement Other: No free air or free fluid.  No hernia. Musculoskeletal: Osseous demineralization. Superior endplate compression fracture L1 as noted on recent lumbar spine radiographs. IMPRESSION: Moderate RIGHT pleural effusion, suspect loculated, with additional component of loculated hydropneumothorax laterally, cannot exclude empyema in patient with history of pneumonia and sepsis. Compressive atelectasis of RIGHT lung with central chronic bronchitic changes, old granulomatous disease, and mild LEFT basilar atelectasis. Enlargement of cardiac chambers with central pulmonary arterial enlargement consistent  with pulmonary hypertension. Scattered colonic diverticulosis. Wall thickening of rectosigmoid colon with perirectal edema compatible with colitis, could represent infection or inflammatory bowel disease. Small RIGHT renal cysts and evidence of prior LEFT nephrectomy. Aortic Atherosclerosis (ICD10-I70.0). Findings called to Dr. Melina Copa On 02/28/2017 at 1545 hrs. Electronically Signed   By: Lavonia Dana M.D.   On: 02/22/2017 15:45   Dg Chest 1 View  Result Date: 02/24/2017 CLINICAL DATA:  Weakness and recent pneumonia EXAM: CHEST 1 VIEW COMPARISON:  Multiple exams, including 02/15/2017 FINDINGS: Significantly worsened right pleural effusion, questionably loculated. Lobular low-density over the margins of the effusion could be from overlying aerated lung or less likely gas within the pleural cavity. Hazy density in the right hemithorax likely primarily from the pleural effusion, superimposed pneumonia is not excluded. Mildly improved aeration at the left lung base. The indistinctness of the pulmonary vasculature is improved, at least on the left. Atherosclerotic calcification of the aortic arch. IMPRESSION: 1. Significant worsening in hazy opacity and suspected pleural effusion on the right. Superimposed pneumonia not excluded. Gas density along the pleural effusion probably represents aerated lung projecting over potentially loculated pleural effusion, a small amount of gas in the pleural space is a less likely differential diagnostic consideration. Empyema is not excluded. 2. Mildly improved aeration at the left lung base. 3.  Atherosclerotic calcification of the aortic arch. Electronically Signed   By: Van Clines M.D.   On: 03/09/2017 11:43   Ct Chest Wo Contrast  Result Date: 03/02/2017 CLINICAL DATA:  Worsening shortness of breath, abnormal chest radiograph with question pneumonia, leukocytosis, sepsis, abnormal LFTs, history C difficile colitis, pulmonary hypertension, persistent atrial  fibrillation, diabetes mellitus, prostate cancer, low-grade myelodysplastic syndrome, former smoker EXAM: CT CHEST, ABDOMEN AND PELVIS WITHOUT CONTRAST TECHNIQUE: Multidetector CT imaging of the chest, abdomen and pelvis was performed following the standard protocol without IV contrast. Sagittal and coronal MPR images reconstructed from axial data set. No oral contrast was administered. COMPARISON:  CT chest 02/15/2017, CT abdomen and pelvis 03/10/2016, lumbar spine radiographs 02/15/2017 FINDINGS: CT CHEST FINDINGS Cardiovascular: Atherosclerotic calcifications aorta, proximal great vessels and coronary arteries. Aorta normal caliber. Significant aortic valvular calcification. No pericardial effusion. Enlarged central pulmonary arteries consistent with history of pulmonary arterial hypertension. Mediastinum/Nodes: Calcified lymph nodes RIGHT hilum and mediastinum consistent with old granulomatous disease. Esophagus unremarkable. No enlarged thoracic lymph nodes. Base of cervical region unremarkable. Lungs/Pleura: Atelectasis and peribronchial thickening in LEFT lower lobe. No LEFT pleural effusion. Moderate RIGHT pleural effusion which appears at least partially loculated. Foci of gas are seen within the RIGHT hemithorax as well consistent with hydropneumothorax. Calcified granulomata RIGHT lung. Compressive atelectasis of the RIGHT lung particularly RIGHT lower lobe to lesser degree posterior aspects of the RIGHT upper and RIGHT middle lobes. No additional pulmonary mass. No definite endobronchial lesions. Musculoskeletal:  Osseous demineralization. Old RIGHT rib fractures. Chronic appearing height losses of several thoracic vertebra. CT ABDOMEN PELVIS FINDINGS Hepatobiliary: Gallbladder contracted.  Liver unremarkable. Pancreas: Normal appearance Spleen: Atrophic Adrenals/Urinary Tract: Adrenal glands unremarkable. Absent LEFT kidney. Probable small RIGHT renal cysts without gross hydronephrosis or solid mass the  ureters and bladder unremarkable. Stomach/Bowel: Normal appendix. Scattered colonic diverticulosis without evidence of diverticulitis. Rectosigmoid wall thickening with stranding of perirectal fat into presacral space. Stomach and bowel loops otherwise normal appearance. Vascular/Lymphatic: Atherosclerotic calcifications without aneurysm. Tortuous iliac arteries with atherosclerotic calcification extending into common femoral arteries. No adenopathy. Reproductive: Minimal prostatic enlargement Other: No free air or free fluid.  No hernia. Musculoskeletal: Osseous demineralization. Superior endplate compression fracture L1 as noted on recent lumbar spine radiographs. IMPRESSION: Moderate RIGHT pleural effusion, suspect loculated, with additional component of loculated hydropneumothorax laterally, cannot exclude empyema in patient with history of pneumonia and sepsis. Compressive atelectasis of RIGHT lung with central chronic bronchitic changes, old granulomatous disease, and mild LEFT basilar atelectasis. Enlargement of cardiac chambers with central pulmonary arterial enlargement consistent with pulmonary hypertension. Scattered colonic diverticulosis. Wall thickening of rectosigmoid colon with perirectal edema compatible with colitis, could represent infection or inflammatory bowel disease. Small RIGHT renal cysts and evidence of prior LEFT nephrectomy. Aortic Atherosclerosis (ICD10-I70.0). Findings called to Dr. Melina Copa On 03/05/2017 at 1545 hrs. Electronically Signed   By: Lavonia Dana M.D.   On: 03/10/2017 15:45       ASSESSMENT and PLAN  Empyema of right pleural space ([place]) I suspect his failure to thrive in the last few weeks is likely due to empyema of the right pleural space.  Plan -I do not recommend surgery for this given his age and comorbidities and frail health - I do recommend at least an attempt at diagnostic thoracentesis and to drain as much fluid as possible with tube thoracostomy -  Depending on the response to tube thoracostomy he might or might not need local instillation of thrombolytics   - However, for this the patient and the family need to get on board. At this late hour in the day talking to the daughter who lives out of state and talking to her on the phone she appeared uncertain if thoracentesis itself would be considered invasive and a 84 year old male. She was uncertain about the risk versus benefit. Therefore I recommended that she think about it and talk about it with her brother. Patient himself seemed okay with doing thoracentesis and tube thoracostomy but I'm not sure of his capacity. This should be best sorted out in the morning on 03/16/2017 by the pulmonary team before proceeding with thoracentesis      FAMILY  - Updates: 02/19/2017 --> daughter Juliann Pulse over phone  - Inter-disciplinary family meet or Palliative Care meeting due by:  DAy 7. Current LOS is LOS 0 days  CODE STATUS    Code Status Orders  (From admission, onward)        Start     Ordered   03/04/2017 1427  Do not attempt resuscitation (DNR)  Continuous    Question Answer Comment  In the event of cardiac or respiratory ARREST Do not call a "code blue"   In the event of cardiac or respiratory ARREST Do not perform Intubation, CPR, defibrillation or ACLS   In the event of cardiac or respiratory ARREST Use medication by any route, position, wound care, and other measures to relive pain and suffering. May use oxygen, suction and manual treatment of airway obstruction as needed  for comfort.      02/25/2017 1427    Code Status History    Date Active Date Inactive Code Status Order ID Comments User Context   02/15/2017 12:59 02/17/2017 18:49 DNR [id number]  Reyne Dumas, MD Inpatient   02/15/2017 08:53 02/15/2017 12:59 Full Code [id number]  Reyne Dumas, MD ED   02/08/2017 20:52 02/12/2017 18:11 Full Code [id number]  Jani Gravel, MD Inpatient   03/26/2014 14:39 04/01/2014 15:08 Full Code [id number]   Franchot Gallo, MD Inpatient    Advance Directive Documentation     Most Recent Value  Type of Advance Directive  Healthcare Power of [place], Living will [pt states he is a dnr]  Pre-existing out of facility DNR order (yellow form or pink MOST form)  No data  "MOST" Form in Place?  No data        DISPO MedSurg     Dr. Brand Males, M.D., [facility].C.P Pulmonary and Critical Care Medicine Staff Physician, [place] Director - Interstitial Lung Disease  Program  Pulmonary [place] at [place], [place], [address]  Pager: [phone], If no answer or between  15:00h - 7:00h: call [phone] Telephone: [phone]

## 2017-03-15 NOTE — ED Notes (Signed)
IV attempts x 3 unsuccessful.

## 2017-03-15 NOTE — Progress Notes (Signed)
Per the [facility] from [facility] the patient was on Vancomycin oral solution 125mg  q6h from 02/25/17 - 03/07/17.  Romeo Rabon, PharmD. Mobile: [phone]. 02/18/2017,12:07 PM.

## 2017-03-15 NOTE — ED Notes (Signed)
Delay in transport due to patient being in CT

## 2017-03-15 NOTE — H&P (Signed)
History and Physical    Alexander Barnett UTM:[id number] DOB: [birth year] DOA: 02/25/2017  PCP: Alexander Jordan, MD  Patient coming from: Rehab  Chief Complaint: Worsening shortness of breath and worsening pneumonia  HPI: Alexander Barnett is a 84 y.o. male with medical history significant of recent [facility] pneumonia hospitalized beginning of February and treated with cefepime and oral Vantin at discharge.  Patient went to rehab center.  All of his history is obtained from his son who is present.  Apparently in the summer he suffered from C. difficile colitis which was treated and resolved.  Then he got this pneumonia last month and after he started antibiotics he subsequently was diagnosed again with C. difficile and has been started on oral vancomycin.  This is not reflected on his med rec sheet despite pharmacy review.  We do not have a any records from his rehab center.  Apparently he has deteriorated over the last month or so and is gotten to the point where he is bedbound.  He was sent here apparently for worsening shortness of breath.  Patient's only complaint is shortness of breath.  It is unknown if he has been running fevers.  He is still having diarrhea.  He has not had any nausea or vomiting.  He still has a productive cough.  Today chest x-ray shows a possible loculated pneumonia and a white count of 35.  His blood pressure is stable.  Patient is getting a CT of his abdomen chest and pelvis which is pending.  Patient is being referred for admission for sepsis.  Review of Systems: As per HPI otherwise 10 point review of systems negative.   Past Medical History:  Diagnosis Date  . Anemia   . Aortic stenosis    moderate by echo 12/2015  . Arthritis    Fingers  . BPH (benign prostatic hyperplasia)   . Bradycardia   . CKD (chronic kidney disease) stage 3, GFR 30-59 ml/min (HCC)   . Diabetes mellitus without complication ([place])    prediabetes diet controlled  . Diastolic dysfunction     . Diverticulosis of colon   . Erectile dysfunction   . Hematuria   . History of kidney cancer   . History of skin cancer   . Hx of transfusion   . Hypertension   . Persistent atrial fibrillation ([place]) 06/2006   refused anticoagulation  . Prostate cancer ([place])   . Pulmonary HTN ([place]) 12/23/2015   Moderate with PASP 74mHg  . Transitional cell carcinoma of bladder (HCC)    and renal pelvis    Past Surgical History:  Procedure Laterality Date  . CYSTOSCOPY N/A 06/24/2012   Procedure: CYSTOSCOPY;  Surgeon: LDutch Gray MD;  Location: [facility];  Service: Urology;  Laterality: N/A;  . CYSTOSCOPY N/A 08/05/2012   Procedure: CYSTOSCOPY;  Surgeon: LDutch Gray MD;  Location: [facility];  Service: Urology;  Laterality: N/A;  CYSTO, CLOT EVACUATION, FULGERATION OF BLEEDING AREAS AT BLADDER NECK AND PROSTATIC URETHRA    . EYE SURGERY  02/2004   bil.cataract w/ lens implant  . HERNIA REPAIR  05/2002   right  . hyperbaric treatments     40 treatments for prostate-to try to stop bleeding  . imrt radiation  04/2006   for prostate cancer  . MOHS SURGERY  02/2002   nose  . NEPHRECTOMY  03/2007   left  . PROSTATE SURGERY  12/2003  . PROSTATE SURGERY  03/1998   surgery in prostate area  . targus  04/1998   to prostate  . TONSILLECTOMY     as child  . TRANSURETHRAL RESECTION OF BLADDER TUMOR N/A 03/29/2014   Procedure: CYSTOSCOPY, CLOT EVACUATION, RIGHT RETROGRADE, TRANSURETHRAL RESECTION OF BLADDER TUMOR (TURBT);  Surgeon: Ardis Hughs, MD;  Location: [facility];  Service: Urology;  Laterality: N/A;  . TRANSURETHRAL RESECTION OF PROSTATE N/A 06/24/2012   Procedure: TRANSURETHRAL RESECTION OF THE PROSTATE (TURP);  Surgeon: Dutch Gray, MD;  Location: [facility];  Service: Urology;  Laterality: N/A;  CYSTOSCOPY, TRANSURETHRAL RESECTION OF PROSTATIC URETHRAL TUMOR   . URINARY SURGERY  03/1998   bleeding     reports that he quit smoking about 54 years ago. His smoking use included cigarettes. He has a  30.00 pack-year smoking history. he has never used smokeless tobacco. He reports that he drinks about 2.4 oz of alcohol per week. He reports that he does not use drugs.  Allergies  Allergen Reactions  . Amoxicillin Diarrhea    Has patient had a PCN reaction causing immediate rash, facial/tongue/throat swelling, SOB or lightheadedness with hypotension: no Has patient had a PCN reaction causing severe rash involving mucus membranes or skin necrosis: no Has patient had a PCN reaction that required hospitalization: unknown Has patient had a PCN reaction occurring within the last 10 years: no If all of the above answers are "NO", then may proceed with Cephalosporin use.  . Ciprofloxacin Other (See Comments)    Muscle strain in groin    History reviewed. No pertinent family history.  No premature coronary artery disease  Prior to Admission medications   Medication Sig Start Date End Date Taking? Authorizing Provider  acetaminophen (TYLENOL) 325 MG tablet Take 2 tablets (650 mg total) by mouth every 6 (six) hours as needed for mild pain or moderate pain. 02/17/17 03/19/17 Yes Elodia Florence., MD  atenolol (TENORMIN) 25 MG tablet Take 1 tablet (25 mg total) by mouth daily. Patient needs to keep 03/02/17 appointment for further refills 01/30/17  Yes Turner, Eber Hong, MD  cholecalciferol (VITAMIN D) 1000 UNITS tablet Take 1,000 Units by mouth daily.   Yes [provider]  Coenzyme Q10 (CO Q-10) 100 MG CAPS Take 100 mg by mouth daily.    Yes [provider]  diphenoxylate-atropine (LOMOTIL) 2.5-0.025 MG tablet Take 1 tablet by mouth daily as needed for diarrhea or loose stools. 09/22/16  Yes [provider]  feeding supplement, GLUCERNA SHAKE, (GLUCERNA SHAKE) LIQD Take 237 mLs by mouth 3 (three) times daily between meals. 02/17/17 03/19/17 Yes Elodia Florence., MD  finasteride (PROSCAR) 5 MG tablet Take 5 mg by mouth every morning.    Yes [provider]  losartan  (COZAAR) 100 MG tablet Take 1 tablet (100 mg total) by mouth daily. 03/05/17  Yes Turner, Eber Hong, MD  magnesium hydroxide (MILK OF MAGNESIA) 400 MG/5ML suspension Take 30 mLs by mouth daily as needed for mild constipation.   Yes [provider]  mirtazapine (REMERON) 15 MG tablet Take 7.5 mg by mouth at bedtime.   Yes [provider]  Multiple Vitamin (MULTIVITAMIN WITH MINERALS) TABS Take 1 tablet by mouth daily.   Yes [provider]  Multiple Vitamins-Minerals (ICAPS AREDS 2) CAPS Take 1 capsule by mouth daily.   Yes [provider]  saccharomyces boulardii (FLORASTOR) 250 MG capsule Take 250 mg by mouth 2 (two) times daily.   Yes [provider]  travoprost, benzalkonium, (TRAVATAN) 0.004 % ophthalmic solution Place 1 drop into both eyes  at bedtime.   Yes [provider]    Physical Exam: Vitals:   02/26/2017 0936 03/01/2017 1240 02/18/2017 1330 02/18/2017 1400  BP: 99/67 114/77 108/73   Pulse: (!) 101 (!) 108 (!) 102   Resp: 18 (!) 22 20   Temp: (!) 97.5 F (36.4 C)     SpO2: 100% 100% 99%   Weight:    62.3 kg (137 lb 5.6 oz)      Constitutional: NAD, calm, comfortable in no respiratory distress Vitals:   02/20/2017 0936 03/08/2017 1240 02/21/2017 1330 03/14/2017 1400  BP: 99/67 114/77 108/73   Pulse: (!) 101 (!) 108 (!) 102   Resp: 18 (!) 22 20   Temp: (!) 97.5 F (36.4 C)     SpO2: 100% 100% 99%   Weight:    62.3 kg (137 lb 5.6 oz)   Eyes: PERRL, lids and conjunctivae normal ENMT: Mucous membranes are dry. Posterior pharynx clear of any exudate or lesions.Normal dentition.  Neck: normal, supple, no masses, no thyromegaly Respiratory: clear to auscultation bilaterally but very decreased on the right, no wheezing, no crackles. Normal respiratory effort. No accessory muscle use.  Cardiovascular: Regular rate and rhythm, no murmurs / rubs / gallops. No extremity edema. 2+ pedal pulses. No carotid bruits.  Abdomen: no tenderness, no  masses palpated. No hepatosplenomegaly. Bowel sounds positive.  Musculoskeletal: no clubbing / cyanosis. No joint deformity upper and lower extremities. Good ROM, no contractures. Normal muscle tone.  Skin: no rashes, lesions, ulcers. No induration Neurologic: CN 2-12 grossly intact. Sensation intact, DTR normal. Strength 5/5 in all 4.  Psychiatric: Normal judgment and insight. Alert and oriented x 3. Normal mood.    Labs on Admission: I have personally reviewed following labs and imaging studies  CBC: Recent Labs  Lab 03/09/2017 1106  WBC 35.7*  NEUTROABS 33.2*  HGB 9.6*  HCT 29.5*  MCV 79.5  PLT 449   Basic Metabolic Panel: Recent Labs  Lab 02/20/2017 1106  NA 139  K 5.5*  CL 112*  CO2 19*  GLUCOSE 104*  BUN 64*  CREATININE 2.92*  CALCIUM 7.9*   GFR: Estimated Creatinine Clearance: 13.6 mL/min (A) (by C-G formula based on SCr of 2.92 mg/dL (H)). Liver Function Tests: Recent Labs  Lab 02/17/2017 1106  AST 1,193*  ALT 772*  ALKPHOS 111  BILITOT 0.9  PROT 6.4*  ALBUMIN 2.2*   Recent Labs  Lab 03/05/2017 1106  LIPASE 48   No results for input(s): AMMONIA in the last 168 hours. Coagulation Profile: Recent Labs  Lab 03/03/2017 1106  INR 1.38   Cardiac Enzymes: No results for input(s): CKTOTAL, CKMB, CKMBINDEX, TROPONINI in the last 168 hours. BNP (last 3 results) No results for input(s): PROBNP in the last 8760 hours. HbA1C: No results for input(s): HGBA1C in the last 72 hours. CBG: No results for input(s): GLUCAP in the last 168 hours. Lipid Profile: No results for input(s): CHOL, HDL, LDLCALC, TRIG, CHOLHDL, LDLDIRECT in the last 72 hours. Thyroid Function Tests: No results for input(s): TSH, T4TOTAL, FREET4, T3FREE, THYROIDAB in the last 72 hours. Anemia Panel: No results for input(s): VITAMINB12, FOLATE, FERRITIN, TIBC, IRON, RETICCTPCT in the last 72 hours. Urine analysis:    Component Value Date/Time   COLORURINE YELLOW 02/15/2017 0920    APPEARANCEUR CLEAR 02/15/2017 0920   LABSPEC 1.008 02/15/2017 0920   PHURINE 6.0 02/15/2017 0920   GLUCOSEU NEGATIVE 02/15/2017 0920   HGBUR NEGATIVE 02/15/2017 0920   BILIRUBINUR NEGATIVE 02/15/2017 0920  KETONESUR NEGATIVE 02/15/2017 0920   PROTEINUR NEGATIVE 02/15/2017 0920   UROBILINOGEN 0.2 07/14/2012 1127   NITRITE NEGATIVE 02/15/2017 0920   LEUKOCYTESUR TRACE (A) 02/15/2017 0920   Sepsis Labs: !!!!!!!!!!!!!!!!!!!!!!!!!!!!!!!!!!!!!!!!!!!! _0 (procalcitonin:4,lacticidven:4) )No results found for this or any previous visit (from the past 240 hour(s)).   Radiological Exams on Admission: Dg Chest 1 View  Result Date: 03/06/2017 CLINICAL DATA:  Weakness and recent pneumonia EXAM: CHEST 1 VIEW COMPARISON:  Multiple exams, including 02/15/2017 FINDINGS: Significantly worsened right pleural effusion, questionably loculated. Lobular low-density over the margins of the effusion could be from overlying aerated lung or less likely gas within the pleural cavity. Hazy density in the right hemithorax likely primarily from the pleural effusion, superimposed pneumonia is not excluded. Mildly improved aeration at the left lung base. The indistinctness of the pulmonary vasculature is improved, at least on the left. Atherosclerotic calcification of the aortic arch. IMPRESSION: 1. Significant worsening in hazy opacity and suspected pleural effusion on the right. Superimposed pneumonia not excluded. Gas density along the pleural effusion probably represents aerated lung projecting over potentially loculated pleural effusion, a small amount of gas in the pleural space is a less likely differential diagnostic consideration. Empyema is not excluded. 2. Mildly improved aeration at the left lung base. 3.  Atherosclerotic calcification of the aortic arch. Electronically Signed   By: Van Clines M.D.   On: 02/23/2017 11:43    Old chart reviewed Case discussed with Dr. Grace Blight reviewed worsening  right-sided pneumonia with possible loculation  Assessment/Plan 84 year old male with sepsis probably secondary to worsening pneumonia but reportedly recently diagnosed with C. difficile colitis Principal Problem:   Sepsis (HCC)-unclear source.  Patient is getting a CT of his abdomen chest and pelvis.  I suspect this is mostly due to his worsening healthcare associated pneumonia however apparently was recently diagnosed with C. difficile.  Will place on vancomycin IV cefepime IV and Dificid.  Check pro calcitonin levels.  Blood pressure stable and lactic acid is normal.  Obtain old records from rehab center.  Place on contact precautions in the ED now.   Active Problems:   C. difficile colitis-placed on Dificid    HCAP (healthcare-associated pneumonia)-antibiotics as above IV vancomycin and cefepime placed on pneumonia pathway obtain blood and sputum cultures    Hepatitis-check acute hepatitis panel.  Could be due to his overall sepsis.  Repeat LFTs in the morning.  Nonobstructive pattern T bili and alk phos are normal.    FTT (failure to thrive) in adult-noted patient has overall poor prognosis.    Persistent atrial fibrillation (HCC)-stable    CKD (chronic kidney disease) stage 3, GFR 30-59 ml/min (HCC)-patient with mild acute kidney injury with creatinine of 2.9 now.  IV fluids.  Patient dehydrated.    Aortic stenosis-noted    Diastolic dysfunction-noted    Pulmonary HTN (HCC)-stable    MDS (myelodysplastic syndrome), low grade (HCC)-noted    SOB (shortness of breath)-due to worsening pneumonia.  Had a long discussion with his son concerning his CODE STATUS DNR was confirmed.  Also discussed the possibility of him needing a chest tube placement if this is indeed loculated.  He and his son both want to try to avoid any aggressive intervention at this time.  We will plan on doing antibiotics at this time.  May benefit from a thoracentesis however.  Will await CT results of his  chest.  DVT prophylaxis: SCDs Code Status: DNR confirmed with son Family Communication: Son Disposition Plan: Per day team Consults  called: None at this time Admission status: Admission   Farzad Tibbetts A MD [facility]  If 7PM-7AM, please contact night-coverage [email or web address] Password [facility]  02/24/2017, 2:29 PM

## 2017-03-15 NOTE — ED Notes (Signed)
Bed: VX79 Expected date:  Expected time:  Means of arrival:  Comments: 84 yo m pnu

## 2017-03-15 NOTE — Progress Notes (Signed)
Pharmacy Antibiotic Note  Alexander Barnett is a 84 y.o. male admitted on 03/09/2017 with pneumonia.  Pharmacy has been consulted for vanc/cefepime dosing.  Plan: 1) Vancomycin 1250mg  IV x 1 then 500mg  IV q48 - goal AUC 400-500 2) Cefepime 1g IV q24  Weight: 137 lb 5.6 oz (62.3 kg)  Temp (24hrs), Avg:97.5 F (36.4 C), Min:97.5 F (36.4 C), Max:97.5 F (36.4 C)  Recent Labs  Lab 03/08/2017 1106 03/08/2017 1112 02/16/2017 1303  WBC 35.7*  --   --   CREATININE 2.92*  --   --   LATICACIDVEN  --  1.88 1.69    Estimated Creatinine Clearance: 13.6 mL/min (A) (by C-G formula based on SCr of 2.92 mg/dL (H)).    Allergies  Allergen Reactions  . Amoxicillin Diarrhea    Has patient had a PCN reaction causing immediate rash, facial/tongue/throat swelling, SOB or lightheadedness with hypotension: no Has patient had a PCN reaction causing severe rash involving mucus membranes or skin necrosis: no Has patient had a PCN reaction that required hospitalization: unknown Has patient had a PCN reaction occurring within the last 10 years: no If all of the above answers are "NO", then may proceed with Cephalosporin use.  . Ciprofloxacin Other (See Comments)    Muscle strain in groin     Thank you for allowing pharmacy to be a part of this patient's care.  Adrian Saran, PharmD, BCPS Pager [phone] 02/26/2017 2:17 PM

## 2017-03-15 NOTE — ED Triage Notes (Signed)
Per EMS, pt from [facility] for pneumonia. Been treated there, but MD sent him out for further evaluation today due to unknown reasons. Pt wears 2L nasal cannula at home normally.

## 2017-03-15 NOTE — ED Notes (Signed)
ED TO INPATIENT HANDOFF REPORT  Name/Age/Gender Alexander Barnett 84 y.o. male  Code Status    Code Status Orders  (From admission, onward)        Start     Ordered   03/03/2017 1427  Do not attempt resuscitation (DNR)  Continuous    Question Answer Comment  In the event of cardiac or respiratory ARREST Do not call a "code blue"   In the event of cardiac or respiratory ARREST Do not perform Intubation, CPR, defibrillation or ACLS   In the event of cardiac or respiratory ARREST Use medication by any route, position, wound care, and other measures to relive pain and suffering. May use oxygen, suction and manual treatment of airway obstruction as needed for comfort.      03/14/2017 1427    Code Status History    Date Active Date Inactive Code Status Order ID Comments User Context   02/15/2017 12:59 02/17/2017 18:49 DNR [id number]  Reyne Dumas, MD Inpatient   02/15/2017 08:53 02/15/2017 12:59 Full Code [id number]  Reyne Dumas, MD ED   02/08/2017 20:52 02/12/2017 18:11 Full Code [id number]  Jani Gravel, MD Inpatient   03/26/2014 14:39 04/01/2014 15:08 Full Code [id number]  Franchot Gallo, MD Inpatient    Advance Directive Documentation     Most Recent Value  Type of Advance Directive  Healthcare Power of [place], Living will [pt states he is a dnr]  Pre-existing out of facility DNR order (yellow form or pink MOST form)  No data  "MOST" Form in Place?  No data      Home/SNF/Other Nursing Home  Chief Complaint Pneumonia  Level of Care/Admitting Diagnosis ED Disposition    ED Disposition Condition Comment   Admit  Hospital Area: [facility] [[id number]]  Level of Care: Med-Surg [16]  Diagnosis: Sepsis [facility]) [[id number]]  Admitting Physician: Phillips Grout [[id number]]  Attending Physician: Derrill Kay A [[id number]]  Estimated length of stay: 5 - 7 days  Certification:: I certify this patient will need inpatient services for at least 2 midnights  PT Class (Do Not Modify):  Inpatient [101]  PT Acc Code (Do Not Modify): Private [1]       Medical History Past Medical History:  Diagnosis Date  . Anemia   . Aortic stenosis    moderate by echo 12/2015  . Arthritis    Fingers  . BPH (benign prostatic hyperplasia)   . Bradycardia   . CKD (chronic kidney disease) stage 3, GFR 30-59 ml/min (HCC)   . Diabetes mellitus without complication ([place])    prediabetes diet controlled  . Diastolic dysfunction   . Diverticulosis of colon   . Erectile dysfunction   . Hematuria   . History of kidney cancer   . History of skin cancer   . Hx of transfusion   . Hypertension   . Persistent atrial fibrillation ([place]) 06/2006   refused anticoagulation  . Prostate cancer ([place])   . Pulmonary HTN ([place]) 12/23/2015   Moderate with PASP 64mHg  . 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 Amoxicillin Diarrhea    Has patient had a PCN reaction causing immediate rash, facial/tongue/throat swelling, SOB or lightheadedness with hypotension: no Has patient had a PCN reaction causing severe rash involving mucus membranes or skin necrosis: no Has patient had a PCN reaction that required hospitalization: unknown Has patient had a PCN reaction occurring within the last 10 years:  no If all of the above answers are "NO", then may proceed with Cephalosporin use.  . Ciprofloxacin Other (See Comments)    Muscle strain in groin    IV Location/Drains/Wounds Patient Lines/Drains/Airways Status   Active Line/Drains/Airways    Name:   Placement date:   Placement time:   Site:   Days:   Peripheral IV 02/28/2017 Right Wrist   03/02/2017    1108    Wrist   less than 1   Urethral Catheter Jeneen, RN Straight-tip;Double-lumen 16 Fr.   11/18/16    0155    Straight-tip;Double-lumen   117          Labs/Imaging Results for orders placed or performed during the hospital encounter of 03/14/2017 (from the past 48 hour(s))   Comprehensive metabolic panel     Status: Abnormal   Collection Time: 03/14/2017 11:06 AM  Result Value Ref Range   Sodium 139 135 - 145 mmol/L   Potassium 5.5 (H) 3.5 - 5.1 mmol/L   Chloride 112 (H) 101 - 111 mmol/L   CO2 19 (L) 22 - 32 mmol/L   Glucose, Bld 104 (H) 65 - 99 mg/dL   BUN 64 (H) 6 - 20 mg/dL   Creatinine, Ser 2.92 (H) 0.61 - 1.24 mg/dL   Calcium 7.9 (L) 8.9 - 10.3 mg/dL   Total Protein 6.4 (L) 6.5 - 8.1 g/dL   Albumin 2.2 (L) 3.5 - 5.0 g/dL   AST 1,193 (H) 15 - 41 U/L   ALT 772 (H) 17 - 63 U/L   Alkaline Phosphatase 111 38 - 126 U/L   Total Bilirubin 0.9 0.3 - 1.2 mg/dL   GFR calc non Af Amer 17 (L) >60 mL/min   GFR calc Af Amer 20 (L) >60 mL/min    Comment: (NOTE) The eGFR has been calculated using the CKD EPI equation. This calculation has not been validated in all clinical situations. eGFR's persistently <60 mL/min signify possible Chronic Kidney Disease.    Anion gap 8 5 - 15    Comment: Performed at [facility], [place] [address], [place], [place] [address]  Lipase, blood     Status: None   Collection Time: 03/05/2017 11:06 AM  Result Value Ref Range   Lipase 48 11 - 51 U/L    Comment: Performed at [facility], [place] [address], [place], [place] [address]  CBC with Differential     Status: Abnormal   Collection Time: 03/03/2017 11:06 AM  Result Value Ref Range   WBC 35.7 (H) 4.0 - 10.5 K/uL   RBC 3.71 (L) 4.22 - 5.81 MIL/uL   Hemoglobin 9.6 (L) 13.0 - 17.0 g/dL   HCT 29.5 (L) 39.0 - 52.0 %   MCV 79.5 78.0 - 100.0 fL   MCH 25.9 (L) 26.0 - 34.0 pg   MCHC 32.5 30.0 - 36.0 g/dL   RDW 21.4 (H) 11.5 - 15.5 %   Platelets 349 150 - 400 K/uL   Neutrophils Relative % 93 %   Lymphocytes Relative 4 %   Monocytes Relative 3 %   Eosinophils Relative 0 %   Basophils Relative 0 %   Neutro Abs 33.2 (H) 1.7 - 7.7 K/uL   Lymphs Abs 1.4 0.7 - 4.0 K/uL   Monocytes Absolute 1.1 (H) 0.1 - 1.0 K/uL   Eosinophils Absolute 0.0 0.0 - 0.7  K/uL   Basophils Absolute 0.0 0.0 - 0.1 K/uL   RBC Morphology POLYCHROMASIA PRESENT     Comment: TARGET  CELLS ELLIPTOCYTES    WBC Morphology TOXIC GRANULATION     Comment: Performed at [facility], [place] [address] [place], [place] [address]  Protime-INR     Status: Abnormal   Collection Time: 02/22/2017 11:06 AM  Result Value Ref Range   Prothrombin Time 16.9 (H) 11.4 - 15.2 seconds   INR 1.38     Comment: Performed at [facility], [place] [address] [place], [place] [address]  I-stat troponin, ED     Status: None   Collection Time: 03/06/2017 11:10 AM  Result Value Ref Range   Troponin i, poc 0.03 0.00 - 0.08 ng/mL   Comment 3            Comment: Due to the release kinetics of cTnI, a negative result within the first hours of the onset of symptoms does not rule out myocardial infarction with certainty. If myocardial infarction is still suspected, repeat the test at appropriate intervals.   I-Stat CG4 Lactic Acid, ED     Status: None   Collection Time: 02/24/2017 11:12 AM  Result Value Ref Range   Lactic Acid, Venous 1.88 0.5 - 1.9 mmol/L  I-Stat CG4 Lactic Acid, ED     Status: None   Collection Time: 03/06/2017  1:03 PM  Result Value Ref Range   Lactic Acid, Venous 1.69 0.5 - 1.9 mmol/L   Dg Chest 1 View  Result Date: 03/10/2017 CLINICAL DATA:  Weakness and recent pneumonia EXAM: CHEST 1 VIEW COMPARISON:  Multiple exams, including 02/15/2017 FINDINGS: Significantly worsened right pleural effusion, questionably loculated. Lobular low-density over the margins of the effusion could be from overlying aerated lung or less likely gas within the pleural cavity. Hazy density in the right hemithorax likely primarily from the pleural effusion, superimposed pneumonia is not excluded. Mildly improved aeration at the left lung base. The indistinctness of the pulmonary vasculature is improved, at least on the left. Atherosclerotic calcification of the aortic arch.  IMPRESSION: 1. Significant worsening in hazy opacity and suspected pleural effusion on the right. Superimposed pneumonia not excluded. Gas density along the pleural effusion probably represents aerated lung projecting over potentially loculated pleural effusion, a small amount of gas in the pleural space is a less likely differential diagnostic consideration. Empyema is not excluded. 2. Mildly improved aeration at the left lung base. 3.  Atherosclerotic calcification of the aortic arch. Electronically Signed   By: Van Clines M.D.   On: 02/27/2017 11:43    Pending Labs Unresulted Labs (From admission, onward)   Start     Ordered   03/16/17 0500  CBC WITH DIFFERENTIAL  Tomorrow morning,   R     02/22/2017 1427   03/16/17 0500  Comprehensive metabolic panel  Tomorrow morning,   R     02/21/2017 1427   02/20/2017 1428  Hepatitis panel, acute  Once,   R     02/23/2017 1427   02/24/2017 1426  Culture, sputum-assessment  Once,   R     02/20/2017 1427   03/10/2017 1426  Gram stain  Once,   R     03/02/2017 1427   02/23/2017 1426  Strep pneumoniae urinary antigen  Once,   R     03/11/2017 1427   03/08/2017 0953  Culture, blood (routine x 2)  BLOOD CULTURE X 2,   STAT     02/16/2017 0953      Vitals/Pain Today's Vitals   02/19/2017 0936 03/08/2017 1240 02/20/2017 1330 03/12/2017 1400  BP:  99/67 114/77 108/73   Pulse: (!) 101 (!) 108 (!) 102   Resp: 18 (!) 22 20   Temp: (!) 97.5 F (36.4 C)     SpO2: 100% 100% 99%   Weight:    137 lb 5.6 oz (62.3 kg)  PainSc:        Isolation Precautions No active isolations  Medications Medications  vancomycin (VANCOCIN) 1,250 mg in sodium chloride 0.9 % 250 mL IVPB (not administered)  ceFEPIme (MAXIPIME) 1 g in sodium chloride 0.9 % 100 mL IVPB (1 g Intravenous New Bag/Given 03/10/2017 1443)  vancomycin (VANCOCIN) 500 mg in sodium chloride 0.9 % 100 mL IVPB (not administered)  ceFEPIme (MAXIPIME) 1 g in sodium chloride 0.9 % 100 mL IVPB (not administered)  fidaxomicin  (DIFICID) tablet 200 mg (not administered)  acetaminophen (TYLENOL) tablet 650 mg (not administered)  feeding supplement (GLUCERNA SHAKE) (GLUCERNA SHAKE) liquid 237 mL (not administered)  losartan (COZAAR) tablet 100 mg (not administered)  mirtazapine (REMERON) tablet 7.5 mg (not administered)  multivitamin with minerals tablet 1 tablet (not administered)  ICAPS AREDS 2 CAPS 1 capsule (not administered)  saccharomyces boulardii (FLORASTOR) capsule 250 mg (not administered)  0.9 %  sodium chloride infusion (not administered)  sodium chloride 0.9 % bolus 500 mL (0 mLs Intravenous Stopped 03/14/2017 1326)    Mobility walks with device

## 2017-03-15 NOTE — ED Notes (Signed)
Holding pt in ED, but holding bed upstairs too until pulmonology give the green light that patient can stay in our hospital

## 2017-03-15 NOTE — ED Provider Notes (Signed)
[facility] DEPT Provider Note   CSN: [id number] Arrival date & time: 02/19/2017  0920     History   Chief Complaint Chief Complaint  Patient presents with  . Pneumonia    HPI Alexander Barnett is a 84 y.o. male.  84 year old male brought in by ambulance from rehab facility [facility].  He is hard of hearing and his son is also here providing some history.  About a month ago he was discharged from here for pneumonia that was then complicated by C. difficile.  He has been at rehab since then.  He is not walked.  Per the son he is just been steadily declining not eating well and getting weaker.  Yesterday they did some blood work and a chest x-ray and were told he had pockets of fluid and needed to be transferred here for further evaluation and management.  The patient denies any fevers cough chest pain shortness of breath abdominal pain nausea vomiting.  He states he still having diarrhea.  He denies a fever but he states he has poor appetite.  But the son states that the facility that they noticed his blood pressure was low when he had increased work of breathing.  The history is provided by the patient and a relative.  Weakness  Primary symptoms include no focal weakness, no loss of balance, no dizziness. This is a new problem. The current episode started more than 2 days ago. The problem has not changed since onset.There was no focality noted. There has been no fever. Associated symptoms include shortness of breath. Pertinent negatives include no chest pain, no vomiting, no altered mental status, no confusion and no headaches. There were no medications administered prior to arrival. Associated medical issues do not include trauma.    Past Medical History:  Diagnosis Date  . Anemia   . Aortic stenosis    moderate by echo 12/2015  . Arthritis    Fingers  . BPH (benign prostatic hyperplasia)   . Bradycardia   . CKD (chronic kidney disease) stage 3, GFR 30-59  ml/min (HCC)   . Diabetes mellitus without complication ([place])    prediabetes diet controlled  . Diastolic dysfunction   . Diverticulosis of colon   . Erectile dysfunction   . Hematuria   . History of kidney cancer   . History of skin cancer   . Hx of transfusion   . Hypertension   . Persistent atrial fibrillation ([place]) 06/2006   refused anticoagulation  . Prostate cancer ([place])   . Pulmonary HTN ([place]) 12/23/2015   Moderate with PASP 54mmHg  . 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 Malnutrition of moderate degree 02/09/2017  . RSV (respiratory syncytial virus pneumonia) 02/09/2017  . Acute hypoxemic respiratory failure ([place]) 02/09/2017  . DM type 2 (diabetes mellitus, type 2) ([place]) 02/09/2017  . Aortic atherosclerosis ([place]) 02/09/2017  . CAP (community acquired pneumonia) 02/08/2017  . MDS (myelodysplastic syndrome), low grade ([place]) 09/27/2016  . CKD (chronic kidney disease) 09/27/2016  . Pulmonary HTN ([place]) 12/23/2015  . Hematuria, gross 03/26/2014  . Bradycardia   . CKD (chronic kidney disease) stage 3, GFR 30-59 ml/min (HCC)   . Aortic stenosis   . Diastolic dysfunction   . Hypertension   . Arthritis   . History of skin cancer   . Hematuria   . Anemia   . Hx of transfusion   .  History of kidney cancer   . Persistent atrial fibrillation ([place]) 06/17/2006    Past Surgical History:  Procedure Laterality Date  . CYSTOSCOPY N/A 06/24/2012   Procedure: CYSTOSCOPY;  Surgeon: Dutch Gray, MD;  Location: [facility];  Service: Urology;  Laterality: N/A;  . CYSTOSCOPY N/A 08/05/2012   Procedure: CYSTOSCOPY;  Surgeon: Dutch Gray, MD;  Location: [facility];  Service: Urology;  Laterality: N/A;  CYSTO, CLOT EVACUATION, FULGERATION OF BLEEDING AREAS AT BLADDER NECK AND PROSTATIC URETHRA    . EYE SURGERY  02/2004   bil.cataract w/ lens implant  . HERNIA REPAIR  05/2002   right  . hyperbaric treatments      40 treatments for prostate-to try to stop bleeding  . imrt radiation  04/2006   for prostate cancer  . MOHS SURGERY  02/2002   nose  . NEPHRECTOMY  03/2007   left  . PROSTATE SURGERY  12/2003  . PROSTATE SURGERY  03/1998   surgery in prostate area  . targus  04/1998   to prostate  . TONSILLECTOMY     as child  . TRANSURETHRAL RESECTION OF BLADDER TUMOR N/A 03/29/2014   Procedure: CYSTOSCOPY, CLOT EVACUATION, RIGHT RETROGRADE, TRANSURETHRAL RESECTION OF BLADDER TUMOR (TURBT);  Surgeon: Ardis Hughs, MD;  Location: [facility];  Service: Urology;  Laterality: N/A;  . TRANSURETHRAL RESECTION OF PROSTATE N/A 06/24/2012   Procedure: TRANSURETHRAL RESECTION OF THE PROSTATE (TURP);  Surgeon: Dutch Gray, MD;  Location: [facility];  Service: Urology;  Laterality: N/A;  CYSTOSCOPY, TRANSURETHRAL RESECTION OF PROSTATIC URETHRAL TUMOR   . URINARY SURGERY  03/1998   bleeding       Home Medications    Prior to Admission medications   Medication Sig Start Date End Date Taking? Authorizing Provider  acetaminophen (TYLENOL) 325 MG tablet Take 2 tablets (650 mg total) by mouth every 6 (six) hours as needed for mild pain or moderate pain. 02/17/17 03/19/17  Elodia Florence., MD  atenolol (TENORMIN) 25 MG tablet Take 1 tablet (25 mg total) by mouth daily. Patient needs to keep 03/02/17 appointment for further refills 01/30/17   Sueanne Margarita, MD  cholecalciferol (VITAMIN D) 1000 UNITS tablet Take 1,000 Units by mouth daily.    [provider]  Coenzyme Q10 (CO Q-10) 100 MG CAPS Take 1 capsule by mouth daily.     [provider]  diphenoxylate-atropine (LOMOTIL) 2.5-0.025 MG tablet Take 1 tablet by mouth daily as needed for diarrhea or loose stools. 09/22/16   [provider]  feeding supplement, GLUCERNA SHAKE, (GLUCERNA SHAKE) LIQD Take 237 mLs by mouth 3 (three) times daily between meals. 02/17/17 03/19/17  Elodia Florence., MD  finasteride (PROSCAR) 5 MG tablet Take 5 mg by  mouth every morning.     [provider]  losartan (COZAAR) 100 MG tablet Take 1 tablet (100 mg total) by mouth daily. 03/05/17   Sueanne Margarita, MD  Multiple Vitamin (MULTIVITAMIN WITH MINERALS) TABS Take 1 tablet by mouth daily.    [provider]  Multiple Vitamins-Minerals (ICAPS AREDS 2) CAPS Take 1 capsule by mouth daily.    [provider]  travoprost, benzalkonium, (TRAVATAN) 0.004 % ophthalmic solution Place 1 drop into both eyes at bedtime.    [provider]    Family History History reviewed. No pertinent family history.  Social History Social History   Tobacco Use  . Smoking status: Former Smoker    Packs/day: 1.50    Years: 20.00  Pack years: 30.00    Types: Cigarettes    Last attempt to quit: 06/21/1962    Years since quitting: 54.7  . Smokeless tobacco: Never Used  Substance Use Topics  . Alcohol use: Yes    Alcohol/week: 2.4 oz    Types: 4 Glasses of wine per week    Comment: glass wine  occassionally  . Drug use: No     Allergies   Amoxicillin and Ciprofloxacin   Review of Systems Review of Systems  Constitutional: Positive for fatigue. Negative for chills and fever.  HENT: Negative for ear pain and sore throat.   Eyes: Negative for pain and visual disturbance.  Respiratory: Positive for shortness of breath. Negative for cough.   Cardiovascular: Negative for chest pain and palpitations.  Gastrointestinal: Positive for diarrhea. Negative for abdominal pain and vomiting.  Genitourinary: Negative for dysuria and hematuria.  Musculoskeletal: Negative for arthralgias, back pain and neck pain.  Skin: Negative for color change and rash.  Neurological: Positive for weakness. Negative for dizziness, focal weakness, seizures, syncope, headaches and loss of balance.  Hematological: Bruises/bleeds easily.  Psychiatric/Behavioral: Negative for confusion.  All other systems reviewed and are negative.    Physical  Exam Updated Vital Signs BP 99/67   Pulse (!) 101   Temp (!) 97.5 F (36.4 C)   Resp 18   SpO2 100%   Physical Exam  Constitutional: He appears well-developed and well-nourished.  HENT:  Head: Normocephalic and atraumatic.  Mouth/Throat: Oropharynx is clear and moist.  Eyes: Conjunctivae are normal. Pupils are equal, round, and reactive to light. Right eye exhibits no discharge. Left eye exhibits no discharge.  Neck: Normal range of motion. Neck supple.  Cardiovascular: Normal rate and regular rhythm.  Murmur heard. Pulmonary/Chest: Effort normal and breath sounds normal. No respiratory distress. He has no wheezes. He has no rales. He exhibits no tenderness.  Abdominal: Soft. He exhibits no distension and no mass. There is no tenderness. There is no guarding.  Musculoskeletal: Normal range of motion. He exhibits no edema, tenderness or deformity.  Lymphadenopathy:    He has no cervical adenopathy.  Neurological: He is alert. No sensory deficit. He exhibits normal muscle tone.  Skin: Skin is warm and dry. Capillary refill takes less than 2 seconds.  Ecchymoses on arms  Psychiatric: He has a normal mood and affect.  Nursing note and vitals reviewed.    ED Treatments / Results  Labs (all labs ordered are listed, but only abnormal results are displayed) Labs Reviewed  COMPREHENSIVE METABOLIC PANEL - Abnormal; Notable for the following components:      Result Value   Potassium 5.5 (*)    Chloride 112 (*)    CO2 19 (*)    Glucose, Bld 104 (*)    BUN 64 (*)    Creatinine, Ser 2.92 (*)    Calcium 7.9 (*)    Total Protein 6.4 (*)    Albumin 2.2 (*)    AST 1,193 (*)    ALT 772 (*)    GFR calc non Af Amer 17 (*)    GFR calc Af Amer 20 (*)    All other components within normal limits  CBC WITH DIFFERENTIAL/PLATELET - Abnormal; Notable for the following components:   WBC 35.7 (*)    RBC 3.71 (*)    Hemoglobin 9.6 (*)    HCT 29.5 (*)    MCH 25.9 (*)    RDW 21.4 (*)     Neutro Abs 33.2 (*)  Monocytes Absolute 1.1 (*)    All other components within normal limits  PROTIME-INR - Abnormal; Notable for the following components:   Prothrombin Time 16.9 (*)    All other components within normal limits  CBC WITH DIFFERENTIAL/PLATELET - Abnormal; Notable for the following components:   WBC 36.6 (*)    RBC 3.60 (*)    Hemoglobin 9.2 (*)    HCT 28.5 (*)    MCH 25.6 (*)    RDW 21.2 (*)    All other components within normal limits  COMPREHENSIVE METABOLIC PANEL - Abnormal; Notable for the following components:   Chloride 115 (*)    CO2 17 (*)    BUN 64 (*)    Creatinine, Ser 2.65 (*)    Calcium 7.6 (*)    Total Protein 5.9 (*)    Albumin 2.0 (*)    AST 492 (*)    ALT 590 (*)    GFR calc non Af Amer 19 (*)    GFR calc Af Amer 22 (*)    All other components within normal limits  CULTURE, BLOOD (ROUTINE X 2)  CULTURE, BLOOD (ROUTINE X 2)  CULTURE, EXPECTORATED SPUTUM-ASSESSMENT  GRAM STAIN  MRSA PCR SCREENING  LIPASE, BLOOD  PROCALCITONIN  STREP PNEUMONIAE URINARY ANTIGEN  HEPATITIS PANEL, ACUTE  I-STAT CG4 LACTIC ACID, ED  I-STAT TROPONIN, ED  I-STAT CG4 LACTIC ACID, ED    EKG  EKG Interpretation  Date/Time:  Thursday March 15 2017 10:23:16 EST Ventricular Rate:  108 PR Interval:    QRS Duration: 78 QT Interval:  331 QTC Calculation: 444 R Axis:   70 Text Interpretation:  Atrial fibrillation no acute st/ts Confirmed by Aletta Edouard [phone]) on 02/16/2017 10:38:22 AM       Radiology Ct Abdomen Pelvis Wo Contrast  Result Date: 03/04/2017 CLINICAL DATA:  Worsening shortness of breath, abnormal chest radiograph with question pneumonia, leukocytosis, sepsis, abnormal LFTs, history C difficile colitis, pulmonary hypertension, persistent atrial fibrillation, diabetes mellitus, prostate cancer, low-grade myelodysplastic syndrome, former smoker EXAM: CT CHEST, ABDOMEN AND PELVIS WITHOUT CONTRAST TECHNIQUE: Multidetector CT imaging of the  chest, abdomen and pelvis was performed following the standard protocol without IV contrast. Sagittal and coronal MPR images reconstructed from axial data set. No oral contrast was administered. COMPARISON:  CT chest 02/15/2017, CT abdomen and pelvis 03/10/2016, lumbar spine radiographs 02/15/2017 FINDINGS: CT CHEST FINDINGS Cardiovascular: Atherosclerotic calcifications aorta, proximal great vessels and coronary arteries. Aorta normal caliber. Significant aortic valvular calcification. No pericardial effusion. Enlarged central pulmonary arteries consistent with history of pulmonary arterial hypertension. Mediastinum/Nodes: Calcified lymph nodes RIGHT hilum and mediastinum consistent with old granulomatous disease. Esophagus unremarkable. No enlarged thoracic lymph nodes. Base of cervical region unremarkable. Lungs/Pleura: Atelectasis and peribronchial thickening in LEFT lower lobe. No LEFT pleural effusion. Moderate RIGHT pleural effusion which appears at least partially loculated. Foci of gas are seen within the RIGHT hemithorax as well consistent with hydropneumothorax. Calcified granulomata RIGHT lung. Compressive atelectasis of the RIGHT lung particularly RIGHT lower lobe to lesser degree posterior aspects of the RIGHT upper and RIGHT middle lobes. No additional pulmonary mass. No definite endobronchial lesions. Musculoskeletal: Osseous demineralization. Old RIGHT rib fractures. Chronic appearing height losses of several thoracic vertebra. CT ABDOMEN PELVIS FINDINGS Hepatobiliary: Gallbladder contracted.  Liver unremarkable. Pancreas: Normal appearance Spleen: Atrophic Adrenals/Urinary Tract: Adrenal glands unremarkable. Absent LEFT kidney. Probable small RIGHT renal cysts without gross hydronephrosis or solid mass the ureters and bladder unremarkable. Stomach/Bowel: Normal appendix. Scattered colonic diverticulosis without evidence of diverticulitis.  Rectosigmoid wall thickening with stranding of perirectal  fat into presacral space. Stomach and bowel loops otherwise normal appearance. Vascular/Lymphatic: Atherosclerotic calcifications without aneurysm. Tortuous iliac arteries with atherosclerotic calcification extending into common femoral arteries. No adenopathy. Reproductive: Minimal prostatic enlargement Other: No free air or free fluid.  No hernia. Musculoskeletal: Osseous demineralization. Superior endplate compression fracture L1 as noted on recent lumbar spine radiographs. IMPRESSION: Moderate RIGHT pleural effusion, suspect loculated, with additional component of loculated hydropneumothorax laterally, cannot exclude empyema in patient with history of pneumonia and sepsis. Compressive atelectasis of RIGHT lung with central chronic bronchitic changes, old granulomatous disease, and mild LEFT basilar atelectasis. Enlargement of cardiac chambers with central pulmonary arterial enlargement consistent with pulmonary hypertension. Scattered colonic diverticulosis. Wall thickening of rectosigmoid colon with perirectal edema compatible with colitis, could represent infection or inflammatory bowel disease. Small RIGHT renal cysts and evidence of prior LEFT nephrectomy. Aortic Atherosclerosis (ICD10-I70.0). Findings called to Dr. Melina Copa On 02/18/2017 at 1545 hrs. Electronically Signed   By: Lavonia Dana M.D.   On: 02/22/2017 15:45   Dg Chest 1 View  Result Date: 03/01/2017 CLINICAL DATA:  Weakness and recent pneumonia EXAM: CHEST 1 VIEW COMPARISON:  Multiple exams, including 02/15/2017 FINDINGS: Significantly worsened right pleural effusion, questionably loculated. Lobular low-density over the margins of the effusion could be from overlying aerated lung or less likely gas within the pleural cavity. Hazy density in the right hemithorax likely primarily from the pleural effusion, superimposed pneumonia is not excluded. Mildly improved aeration at the left lung base. The indistinctness of the pulmonary vasculature is  improved, at least on the left. Atherosclerotic calcification of the aortic arch. IMPRESSION: 1. Significant worsening in hazy opacity and suspected pleural effusion on the right. Superimposed pneumonia not excluded. Gas density along the pleural effusion probably represents aerated lung projecting over potentially loculated pleural effusion, a small amount of gas in the pleural space is a less likely differential diagnostic consideration. Empyema is not excluded. 2. Mildly improved aeration at the left lung base. 3.  Atherosclerotic calcification of the aortic arch. Electronically Signed   By: Van Clines M.D.   On: 03/01/2017 11:43   Ct Chest Wo Contrast  Result Date: 03/07/2017 CLINICAL DATA:  Worsening shortness of breath, abnormal chest radiograph with question pneumonia, leukocytosis, sepsis, abnormal LFTs, history C difficile colitis, pulmonary hypertension, persistent atrial fibrillation, diabetes mellitus, prostate cancer, low-grade myelodysplastic syndrome, former smoker EXAM: CT CHEST, ABDOMEN AND PELVIS WITHOUT CONTRAST TECHNIQUE: Multidetector CT imaging of the chest, abdomen and pelvis was performed following the standard protocol without IV contrast. Sagittal and coronal MPR images reconstructed from axial data set. No oral contrast was administered. COMPARISON:  CT chest 02/15/2017, CT abdomen and pelvis 03/10/2016, lumbar spine radiographs 02/15/2017 FINDINGS: CT CHEST FINDINGS Cardiovascular: Atherosclerotic calcifications aorta, proximal great vessels and coronary arteries. Aorta normal caliber. Significant aortic valvular calcification. No pericardial effusion. Enlarged central pulmonary arteries consistent with history of pulmonary arterial hypertension. Mediastinum/Nodes: Calcified lymph nodes RIGHT hilum and mediastinum consistent with old granulomatous disease. Esophagus unremarkable. No enlarged thoracic lymph nodes. Base of cervical region unremarkable. Lungs/Pleura: Atelectasis  and peribronchial thickening in LEFT lower lobe. No LEFT pleural effusion. Moderate RIGHT pleural effusion which appears at least partially loculated. Foci of gas are seen within the RIGHT hemithorax as well consistent with hydropneumothorax. Calcified granulomata RIGHT lung. Compressive atelectasis of the RIGHT lung particularly RIGHT lower lobe to lesser degree posterior aspects of the RIGHT upper and RIGHT middle lobes. No additional pulmonary mass. No definite endobronchial  lesions. Musculoskeletal: Osseous demineralization. Old RIGHT rib fractures. Chronic appearing height losses of several thoracic vertebra. CT ABDOMEN PELVIS FINDINGS Hepatobiliary: Gallbladder contracted.  Liver unremarkable. Pancreas: Normal appearance Spleen: Atrophic Adrenals/Urinary Tract: Adrenal glands unremarkable. Absent LEFT kidney. Probable small RIGHT renal cysts without gross hydronephrosis or solid mass the ureters and bladder unremarkable. Stomach/Bowel: Normal appendix. Scattered colonic diverticulosis without evidence of diverticulitis. Rectosigmoid wall thickening with stranding of perirectal fat into presacral space. Stomach and bowel loops otherwise normal appearance. Vascular/Lymphatic: Atherosclerotic calcifications without aneurysm. Tortuous iliac arteries with atherosclerotic calcification extending into common femoral arteries. No adenopathy. Reproductive: Minimal prostatic enlargement Other: No free air or free fluid.  No hernia. Musculoskeletal: Osseous demineralization. Superior endplate compression fracture L1 as noted on recent lumbar spine radiographs. IMPRESSION: Moderate RIGHT pleural effusion, suspect loculated, with additional component of loculated hydropneumothorax laterally, cannot exclude empyema in patient with history of pneumonia and sepsis. Compressive atelectasis of RIGHT lung with central chronic bronchitic changes, old granulomatous disease, and mild LEFT basilar atelectasis. Enlargement of cardiac  chambers with central pulmonary arterial enlargement consistent with pulmonary hypertension. Scattered colonic diverticulosis. Wall thickening of rectosigmoid colon with perirectal edema compatible with colitis, could represent infection or inflammatory bowel disease. Small RIGHT renal cysts and evidence of prior LEFT nephrectomy. Aortic Atherosclerosis (ICD10-I70.0). Findings called to Dr. Melina Copa On 03/03/2017 at 1545 hrs. Electronically Signed   By: Lavonia Dana M.D.   On: 02/18/2017 15:45    Procedures Procedures (including critical care time)  Medications Ordered in ED Medications  sodium chloride 0.9 % bolus 500 mL (not administered)     Initial Impression / Assessment and Plan / ED Course  I have reviewed the triage vital signs and the nursing notes.  Pertinent labs & imaging results that were available during my care of the patient were reviewed by me and considered in my medical decision making (see chart for details).  Clinical Course as of Mar 16 1100  Thu Mar 15, 2017  1305 Discussed with hospitalist who agrees with getting CT imaging of chest and abdomen.  [MB]    Clinical Course User Index [MB] Hayden Rasmussen, MD      Final Clinical Impressions(s) / ED Diagnoses   Final diagnoses:  Noninfectious gastroenteritis, unspecified type  Empyema lung [facility])    ED Discharge Orders    None       Hayden Rasmussen, MD 03/16/17 1105

## 2017-03-15 NOTE — Assessment & Plan Note (Addendum)
I suspect his failure to thrive in the last few weeks is likely due to empyema of the right pleural space. Do NOT think he is candidate for VATS. Spoke at length w/ his daughter who is concerned about the quality of his life and his recent downward turn. I would recommend CT guided drainage w/ catheter to be placed, then possible TPA/DNase.   Plan Cont current abx NOT surgical candidate  Will contact IR if patient and family amendable I would prefer to only put him thru one procedure & then if incomplete drainage 3d of TPA and DNase --> This could even serve as a palliative intervention,.  Will await daughter to call me back.

## 2017-03-15 NOTE — ED Notes (Signed)
Unsuccessful [place] IV attempts x2 but able to draw ALL labs from attempted sites.

## 2017-03-16 ENCOUNTER — Other Ambulatory Visit: Payer: Self-pay

## 2017-03-16 ENCOUNTER — Encounter (HOSPITAL_COMMUNITY): Payer: Self-pay | Admitting: Physician Assistant

## 2017-03-16 LAB — CBC WITH DIFFERENTIAL/PLATELET
BASOS PCT: 0 %
Basophils Absolute: 0 10*3/uL (ref 0.0–0.1)
EOS ABS: 0 10*3/uL (ref 0.0–0.7)
EOS PCT: 0 %
HCT: 28.5 % — ABNORMAL LOW (ref 39.0–52.0)
Hemoglobin: 9.2 g/dL — ABNORMAL LOW (ref 13.0–17.0)
Lymphocytes Relative: 5 %
Lymphs Abs: 1.8 10*3/uL (ref 0.7–4.0)
MCH: 25.6 pg — ABNORMAL LOW (ref 26.0–34.0)
MCHC: 32.3 g/dL (ref 30.0–36.0)
MCV: 79.2 fL (ref 78.0–100.0)
Monocytes Absolute: 1.2 10*3/uL (ref 0.1–1.0)
Monocytes Relative: 3 %
NEUTROS PCT: 92 %
Neutro Abs: 33.7 10*3/uL (ref 1.7–7.7)
PLATELETS: 248 10*3/uL (ref 150–400)
RBC: 3.6 MIL/uL — AB (ref 4.22–5.81)
RDW: 21.2 % — AB (ref 11.5–15.5)
WBC: 36.6 10*3/uL — AB (ref 4.0–10.5)

## 2017-03-16 LAB — COMPREHENSIVE METABOLIC PANEL
ALBUMIN: 2 g/dL — AB (ref 3.5–5.0)
ALT: 590 U/L — ABNORMAL HIGH (ref 17–63)
ANION GAP: 10 (ref 5–15)
AST: 492 U/L — AB (ref 15–41)
Alkaline Phosphatase: 98 U/L (ref 38–126)
BUN: 64 mg/dL — AB (ref 6–20)
CHLORIDE: 115 mmol/L — AB (ref 101–111)
CO2: 17 mmol/L — ABNORMAL LOW (ref 22–32)
Calcium: 7.6 mg/dL — ABNORMAL LOW (ref 8.9–10.3)
Creatinine, Ser: 2.65 mg/dL — ABNORMAL HIGH (ref 0.61–1.24)
GFR calc Af Amer: 22 mL/min — ABNORMAL LOW (ref >60)
GFR, EST NON AFRICAN AMERICAN: 19 mL/min — AB (ref >60)
GLUCOSE: 98 mg/dL (ref 65–99)
Potassium: 5 mmol/L (ref 3.5–5.1)
Sodium: 142 mmol/L (ref 135–145)
TOTAL PROTEIN: 5.9 g/dL — AB (ref 6.5–8.1)
Total Bilirubin: 0.9 mg/dL (ref 0.3–1.2)

## 2017-03-16 LAB — MRSA PCR SCREENING: MRSA BY PCR: NEGATIVE

## 2017-03-16 LAB — STREP PNEUMONIAE URINARY ANTIGEN: STREP PNEUMO URINARY ANTIGEN: NEGATIVE

## 2017-03-16 MED ORDER — ATENOLOL 25 MG PO TABS
25.0000 mg | ORAL_TABLET | Freq: Every day | ORAL | Status: DC
  Administered 2017-03-16 – 2017-03-20 (×5): 25 mg via ORAL
  Filled 2017-03-16 (×5): qty 1

## 2017-03-16 MED ORDER — VANCOMYCIN HCL 500 MG IV SOLR
500.0000 mg | INTRAVENOUS | Status: DC
  Administered 2017-03-17: 500 mg via INTRAVENOUS
  Filled 2017-03-16: qty 500

## 2017-03-16 MED ORDER — ENSURE ENLIVE PO LIQD: 237.0000 mL | Freq: Three times a day (TID) | ORAL | Status: DC

## 2017-03-16 MED ORDER — LACTATED RINGERS IV SOLN
INTRAVENOUS | Status: AC
Start: 2017-03-16 — End: 2017-03-17
  Administered 2017-03-16 – 2017-03-17 (×2): via INTRAVENOUS

## 2017-03-16 NOTE — Progress Notes (Signed)
PROGRESS NOTE    [name]  WEX:[id number] DOB: 12/14/1920 DOA: 02/16/2017 PCP: Jonathon Jordan, MD   Brief Narrative:  Per HPI Alexander Barnett is Alexander Barnett 84 y.o. male with medical history significant of recent [facility] pneumonia hospitalized beginning of February and treated with cefepime and oral Vantin at discharge.  Patient went to rehab center.  All of his history is obtained from his son who is present.  Apparently in the summer he suffered from C. difficile colitis which was treated and resolved.  Then he got this pneumonia last month and after he started antibiotics he subsequently was diagnosed again with C. difficile and has been started on oral vancomycin.  This is not reflected on his med rec sheet despite pharmacy review.  We do not have [name] any records from his rehab center.  Apparently he has deteriorated over the last month or so and is gotten to the point where he is bedbound.  He was sent here apparently for worsening shortness of breath.  Patient's only complaint is shortness of breath.  It is unknown if he has been running fevers.  He is still having diarrhea.  He has not had any nausea or vomiting.  He still has [name] productive cough.  Today chest x-ray shows [name] possible loculated pneumonia and [name] white count of 35.  His blood pressure is stable.  Patient is getting [name] CT of his abdomen chest and pelvis which is pending.  Patient is being referred for admission for sepsis.  Assessment & Plan:   Principal Problem:   Empyema of right pleural space (HCC) Active Problems:   Persistent atrial fibrillation (HCC)   CKD (chronic kidney disease) stage 3, GFR 30-59 ml/min (HCC)   Aortic stenosis   Diastolic dysfunction   Pulmonary HTN (HCC)   MDS (myelodysplastic syndrome), low grade (HCC)   Sepsis (HCC)   C. difficile colitis   HCAP (healthcare-associated pneumonia)   SOB (shortness of breath)   Hepatitis   FTT (failure to thrive) in adult   Sepsis  Loculated R Pleural Effusion   Hydropneumothorax:  Pt with suspected empyema on imaging.  At this point in time, discussing plan for intervention with thoracentesis/chest tube placement.  PCCM has discussed this with family and at this point in time, they'd like to hold off on this.  Will consult palliative care.   Continue antibiotics Follow MRSA PCR   Negative urine strep Continue IVF Procalcitonin 18 Normal lactate  Acute Hypoxic Respiratory Failure: likely 2/2 above, follow with abx  C diff colitis: not clear to me history of this infection.  No records in report.  Imaging was notable for perirectal edema.  Sounds like he may have been at least partially treated.  Will try to get records from [facility], but for now, will continue dificid (especially in setting of imaging findings above).    Delirium: likely 2/2 above.  He was appropriate this morning for me, but Sudie Bandel bit confused.  Sounds like he's waxing and waning Elmar Antigua bit as PA from PCCM noted he was appropriate and oriented with him, but PCCM attending also noted some confusion.  Will continue to monitor on abx and IVF. - delirium precautions    Elevated liver Enzymes: had I maging with normal appearing liver, contracted gallbladder.  Labs improving today.  Suspect 2/2 sepsis above.   Continue IVF Hepatitis panel pending    FTT (failure to thrive) in adult- suspect 2/2 empyema above  Atrial Fibrillation: Some intermittent tachycardia.  Restart atenolol.  Continue IVF.  Follow.   AKI on CKD (chronic kidney disease) stage 3, GFR 30-59 ml/min (HCC)- Baseline creatinine looks to be 1-1.4.  secondary to volume depleted state and sepsis above.  Improving.  Follow with IVF.  Stop losartan  NAGMA: 2/2 aki, follow  Leukocytosis: 2/2 above  Diastolic Dysfunction  Aortic stenosis  Pulmonary Hypertension - grade 2 diastolic dysfunction on echo 1/25 as well as moderate to severe AS.  Follow outpatient.    DVT prophylaxis: SCD Code Status: DNR Family Communication:  none at bedside Disposition Plan: pending   Consultants:   PCCM  Palliative  Procedures:   none  Antimicrobials:  Anti-infectives (From admission, onward)   Start     Dose/Rate Route Frequency Ordered Stop   03/17/17 1600  vancomycin (VANCOCIN) 500 mg in sodium chloride 0.9 % 100 mL IVPB  Status:  Discontinued     500 mg 100 mL/hr over 60 Minutes Intravenous Every 48 hours 02/17/2017 1418 03/16/17 1051   03/17/17 0300  vancomycin (VANCOCIN) 500 mg in sodium chloride 0.9 % 100 mL IVPB     500 mg 100 mL/hr over 60 Minutes Intravenous Every 36 hours 03/16/17 1051     03/16/17 1500  ceFEPIme (MAXIPIME) 1 g in sodium chloride 0.9 % 100 mL IVPB     1 g 200 mL/hr over 30 Minutes Intravenous Every 24 hours 03/06/2017 1418     03/10/2017 2000  fidaxomicin (DIFICID) tablet 200 mg     200 mg Oral 2 times daily 02/23/2017 1422     02/20/2017 1430  vancomycin (VANCOCIN) 1,250 mg in sodium chloride 0.9 % 250 mL IVPB     1,250 mg 166.7 mL/hr over 90 Minutes Intravenous  Once 02/18/2017 1418 03/07/2017 1817   03/12/2017 1430  ceFEPIme (MAXIPIME) 1 g in sodium chloride 0.9 % 100 mL IVPB     1 g 200 mL/hr over 30 Minutes Intravenous  Once 02/27/2017 1418 02/16/2017 1531     Subjective: Sleeping, [name] bit confused when I woke him up. Denies pain or other complaints.   Objective: Vitals:   03/11/2017 1500 03/06/2017 1803 02/21/2017 2035 03/16/17 0452  BP: 110/79 124/83 118/84 134/86  Pulse: (!) 109 (!) 109 (!) 120 (!) 110  Resp: (!) 32 (!) 28 18 16   Temp:  97.9 F (36.6 C) 97.8 F (36.6 C) 98 F (36.7 C)  TempSrc:  Oral Oral Oral  SpO2: 100% 95% 100% 100%  Weight:        Intake/Output Summary (Last 24 hours) at 03/16/2017 0840 Last data filed at 03/16/2017 0452 Gross per 24 hour  Intake 840 ml  Output 500 ml  Net 340 ml   Filed Weights   03/14/2017 1400  Weight: 62.3 kg (137 lb 5.6 oz)    Examination:  General exam: Appears calm and comfortable  Respiratory system: Unlabored.  Decreased breath sounds  to RLL fields Cardiovascular system: S1 & S2 heard, RRR. No JVD, murmurs, rubs, gallops or clicks. No pedal edema. Gastrointestinal system: Abdomen is nondistended, soft and nontender. No organomegaly or masses felt. Normal bowel sounds heard. Central nervous system: Alert and oriented x 2 (oriented to self, hospital -but couldn't say WL.  Did not know month, but could tell me he was here for pneumonia). CN 2-12 intact, 5/5 strength throughout, sensation to light touch.  Extremities: No LEE Skin: No rashes, lesions or ulcers Psychiatry: Judgement and insight appear normal. Mood & affect appropriate.     Data Reviewed: I have personally reviewed  following labs and imaging studies  CBC: Recent Labs  Lab 03/06/2017 1106 03/16/17 0347  WBC 35.7* 36.6*  NEUTROABS 33.2* 33.7  HGB 9.6* 9.2*  HCT 29.5* 28.5*  MCV 79.5 79.2  PLT 349 573   Basic Metabolic Panel: Recent Labs  Lab 03/09/2017 1106 03/16/17 0347  NA 139 142  K 5.5* 5.0  CL 112* 115*  CO2 19* 17*  GLUCOSE 104* 98  BUN 64* 64*  CREATININE 2.92* 2.65*  CALCIUM 7.9* 7.6*   GFR: Estimated Creatinine Clearance: 15 mL/min (Dao Mearns) (by C-G formula based on SCr of 2.65 mg/dL (H)). Liver Function Tests: Recent Labs  Lab 02/20/2017 1106 03/16/17 0347  AST 1,193* 492*  ALT 772* 590*  ALKPHOS 111 98  BILITOT 0.9 0.9  PROT 6.4* 5.9*  ALBUMIN 2.2* 2.0*   Recent Labs  Lab 02/16/2017 1106  LIPASE 48   No results for input(s): AMMONIA in the last 168 hours. Coagulation Profile: Recent Labs  Lab 03/13/2017 1106  INR 1.38   Cardiac Enzymes: No results for input(s): CKTOTAL, CKMB, CKMBINDEX, TROPONINI in the last 168 hours. BNP (last 3 results) No results for input(s): PROBNP in the last 8760 hours. HbA1C: No results for input(s): HGBA1C in the last 72 hours. CBG: No results for input(s): GLUCAP in the last 168 hours. Lipid Profile: No results for input(s): CHOL, HDL, LDLCALC, TRIG, CHOLHDL, LDLDIRECT in the last 72  hours. Thyroid Function Tests: No results for input(s): TSH, T4TOTAL, FREET4, T3FREE, THYROIDAB in the last 72 hours. Anemia Panel: No results for input(s): VITAMINB12, FOLATE, FERRITIN, TIBC, IRON, RETICCTPCT in the last 72 hours. Sepsis Labs: Recent Labs  Lab 02/23/2017 1112 02/24/2017 1303 03/05/2017 1939  PROCALCITON  --   --  18.11  LATICACIDVEN 1.88 1.69  --     No results found for this or any previous visit (from the past 240 hour(s)).       Radiology Studies: Ct Abdomen Pelvis Wo Contrast  Result Date: 03/03/2017 CLINICAL DATA:  Worsening shortness of breath, abnormal chest radiograph with question pneumonia, leukocytosis, sepsis, abnormal LFTs, history C difficile colitis, pulmonary hypertension, persistent atrial fibrillation, diabetes mellitus, prostate cancer, low-grade myelodysplastic syndrome, former smoker EXAM: CT CHEST, ABDOMEN AND PELVIS WITHOUT CONTRAST TECHNIQUE: Multidetector CT imaging of the chest, abdomen and pelvis was performed following the standard protocol without IV contrast. Sagittal and coronal MPR images reconstructed from axial data set. No oral contrast was administered. COMPARISON:  CT chest 02/15/2017, CT abdomen and pelvis 03/10/2016, lumbar spine radiographs 02/15/2017 FINDINGS: CT CHEST FINDINGS Cardiovascular: Atherosclerotic calcifications aorta, proximal great vessels and coronary arteries. Aorta normal caliber. Significant aortic valvular calcification. No pericardial effusion. Enlarged central pulmonary arteries consistent with history of pulmonary arterial hypertension. Mediastinum/Nodes: Calcified lymph nodes RIGHT hilum and mediastinum consistent with old granulomatous disease. Esophagus unremarkable. No enlarged thoracic lymph nodes. Base of cervical region unremarkable. Lungs/Pleura: Atelectasis and peribronchial thickening in LEFT lower lobe. No LEFT pleural effusion. Moderate RIGHT pleural effusion which appears at least partially loculated. Foci  of gas are seen within the RIGHT hemithorax as well consistent with hydropneumothorax. Calcified granulomata RIGHT lung. Compressive atelectasis of the RIGHT lung particularly RIGHT lower lobe to lesser degree posterior aspects of the RIGHT upper and RIGHT middle lobes. No additional pulmonary mass. No definite endobronchial lesions. Musculoskeletal: Osseous demineralization. Old RIGHT rib fractures. Chronic appearing height losses of several thoracic vertebra. CT ABDOMEN PELVIS FINDINGS Hepatobiliary: Gallbladder contracted.  Liver unremarkable. Pancreas: Normal appearance Spleen: Atrophic Adrenals/Urinary Tract: Adrenal glands unremarkable. Absent LEFT  kidney. Probable small RIGHT renal cysts without gross hydronephrosis or solid mass the ureters and bladder unremarkable. Stomach/Bowel: Normal appendix. Scattered colonic diverticulosis without evidence of diverticulitis. Rectosigmoid wall thickening with stranding of perirectal fat into presacral space. Stomach and bowel loops otherwise normal appearance. Vascular/Lymphatic: Atherosclerotic calcifications without aneurysm. Tortuous iliac arteries with atherosclerotic calcification extending into common femoral arteries. No adenopathy. Reproductive: Minimal prostatic enlargement Other: No free air or free fluid.  No hernia. Musculoskeletal: Osseous demineralization. Superior endplate compression fracture L1 as noted on recent lumbar spine radiographs. IMPRESSION: Moderate RIGHT pleural effusion, suspect loculated, with additional component of loculated hydropneumothorax laterally, cannot exclude empyema in patient with history of pneumonia and sepsis. Compressive atelectasis of RIGHT lung with central chronic bronchitic changes, old granulomatous disease, and mild LEFT basilar atelectasis. Enlargement of cardiac chambers with central pulmonary arterial enlargement consistent with pulmonary hypertension. Scattered colonic diverticulosis. Wall thickening of  rectosigmoid colon with perirectal edema compatible with colitis, could represent infection or inflammatory bowel disease. Small RIGHT renal cysts and evidence of prior LEFT nephrectomy. Aortic Atherosclerosis (ICD10-I70.0). Findings called to Dr. Melina Copa On 02/20/2017 at 1545 hrs. Electronically Signed   By: Lavonia Dana M.D.   On: 02/19/2017 15:45   Dg Chest 1 View  Result Date: 02/27/2017 CLINICAL DATA:  Weakness and recent pneumonia EXAM: CHEST 1 VIEW COMPARISON:  Multiple exams, including 02/15/2017 FINDINGS: Significantly worsened right pleural effusion, questionably loculated. Lobular low-density over the margins of the effusion could be from overlying aerated lung or less likely gas within the pleural cavity. Hazy density in the right hemithorax likely primarily from the pleural effusion, superimposed pneumonia is not excluded. Mildly improved aeration at the left lung base. The indistinctness of the pulmonary vasculature is improved, at least on the left. Atherosclerotic calcification of the aortic arch. IMPRESSION: 1. Significant worsening in hazy opacity and suspected pleural effusion on the right. Superimposed pneumonia not excluded. Gas density along the pleural effusion probably represents aerated lung projecting over potentially loculated pleural effusion, Hendrik Donath small amount of gas in the pleural space is Shaya Reddick less likely differential diagnostic consideration. Empyema is not excluded. 2. Mildly improved aeration at the left lung base. 3.  Atherosclerotic calcification of the aortic arch. Electronically Signed   By: Van Clines M.D.   On: 03/06/2017 11:43   Ct Chest Wo Contrast  Result Date: 03/04/2017 CLINICAL DATA:  Worsening shortness of breath, abnormal chest radiograph with question pneumonia, leukocytosis, sepsis, abnormal LFTs, history C difficile colitis, pulmonary hypertension, persistent atrial fibrillation, diabetes mellitus, prostate cancer, low-grade myelodysplastic syndrome, former  smoker EXAM: CT CHEST, ABDOMEN AND PELVIS WITHOUT CONTRAST TECHNIQUE: Multidetector CT imaging of the chest, abdomen and pelvis was performed following the standard protocol without IV contrast. Sagittal and coronal MPR images reconstructed from axial data set. No oral contrast was administered. COMPARISON:  CT chest 02/15/2017, CT abdomen and pelvis 03/10/2016, lumbar spine radiographs 02/15/2017 FINDINGS: CT CHEST FINDINGS Cardiovascular: Atherosclerotic calcifications aorta, proximal great vessels and coronary arteries. Aorta normal caliber. Significant aortic valvular calcification. No pericardial effusion. Enlarged central pulmonary arteries consistent with history of pulmonary arterial hypertension. Mediastinum/Nodes: Calcified lymph nodes RIGHT hilum and mediastinum consistent with old granulomatous disease. Esophagus unremarkable. No enlarged thoracic lymph nodes. Base of cervical region unremarkable. Lungs/Pleura: Atelectasis and peribronchial thickening in LEFT lower lobe. No LEFT pleural effusion. Moderate RIGHT pleural effusion which appears at least partially loculated. Foci of gas are seen within the RIGHT hemithorax as well consistent with hydropneumothorax. Calcified granulomata RIGHT lung. Compressive atelectasis of the  RIGHT lung particularly RIGHT lower lobe to lesser degree posterior aspects of the RIGHT upper and RIGHT middle lobes. No additional pulmonary mass. No definite endobronchial lesions. Musculoskeletal: Osseous demineralization. Old RIGHT rib fractures. Chronic appearing height losses of several thoracic vertebra. CT ABDOMEN PELVIS FINDINGS Hepatobiliary: Gallbladder contracted.  Liver unremarkable. Pancreas: Normal appearance Spleen: Atrophic Adrenals/Urinary Tract: Adrenal glands unremarkable. Absent LEFT kidney. Probable small RIGHT renal cysts without gross hydronephrosis or solid mass the ureters and bladder unremarkable. Stomach/Bowel: Normal appendix. Scattered colonic  diverticulosis without evidence of diverticulitis. Rectosigmoid wall thickening with stranding of perirectal fat into presacral space. Stomach and bowel loops otherwise normal appearance. Vascular/Lymphatic: Atherosclerotic calcifications without aneurysm. Tortuous iliac arteries with atherosclerotic calcification extending into common femoral arteries. No adenopathy. Reproductive: Minimal prostatic enlargement Other: No free air or free fluid.  No hernia. Musculoskeletal: Osseous demineralization. Superior endplate compression fracture L1 as noted on recent lumbar spine radiographs. IMPRESSION: Moderate RIGHT pleural effusion, suspect loculated, with additional component of loculated hydropneumothorax laterally, cannot exclude empyema in patient with history of pneumonia and sepsis. Compressive atelectasis of RIGHT lung with central chronic bronchitic changes, old granulomatous disease, and mild LEFT basilar atelectasis. Enlargement of cardiac chambers with central pulmonary arterial enlargement consistent with pulmonary hypertension. Scattered colonic diverticulosis. Wall thickening of rectosigmoid colon with perirectal edema compatible with colitis, could represent infection or inflammatory bowel disease. Small RIGHT renal cysts and evidence of prior LEFT nephrectomy. Aortic Atherosclerosis (ICD10-I70.0). Findings called to Dr. Melina Copa On 03/09/2017 at 1545 hrs. Electronically Signed   By: Lavonia Dana M.D.   On: 02/16/2017 15:45        Scheduled Meds: . feeding supplement (GLUCERNA SHAKE)  237 mL Oral TID BM  . fidaxomicin  200 mg Oral BID  . mirtazapine  7.5 mg Oral QHS  . multivitamin  1 tablet Oral Daily  . multivitamin with minerals  1 tablet Oral Daily  . saccharomyces boulardii  250 mg Oral BID   Continuous Infusions: . ceFEPime (MAXIPIME) IV    . lactated ringers    . [START ON 03/17/2017] vancomycin       LOS: 1 day    Time spent: over 30 min    Fayrene Helper, MD [facility] Pager [phone]  If 7PM-7AM, please contact night-coverage [email or web address] Password [email or web address] 03/16/2017, 8:40 AM

## 2017-03-16 NOTE — Discharge Instructions (Signed)

## 2017-03-16 NOTE — Progress Notes (Signed)
PULMONARY / CRITICAL CARE MEDICINE   Name: Alexander Barnett MRN: [id number] DOB: January 22, 1920 PCP Jonathon Jordan, MD LOS 1 as of 03/16/2017     ADMISSION DATE:  03/12/2017 CONSULTATION DATE:  03/16/2017   REFERRING MD:  Raelene Bott DAVID  CHIEF COMPLAINT:  empyema  HISTORY OF PRESENT ILLNESS:    History is gained by talking to the patient, his daughter and also review off tried hospitalist records.  There is a 84 year old male who apparently in summer of 2018 had C. difficile colitis. Recently in Feb 2019 he had community-acquired pneumonia and was discharged to rehabilitation. This was in February 2019. He was discharged on antibiotics. It's unclear how long he has been in the rehabilitation but I suspect for at least a month or much of this month. Then beginning approximately 2-3 weeks ago he started deteriorating in terms of lost energy, increased fatigue and decreased appetite, decreased motivation and generalized failure to thrive. Patient himself narrated to the hospital is that his only complaint is shortness of breath getting worse. It's unclear if he's been having low-grade fevers. Upon admission to the hospital it was found that he had a high white count of 35,000 and a CT scan of the chest that showed right-sided loculated effusion and therefore pulmonary has been consulted.  Subjective  No distress.   VITAL SIGNS: BP 134/86 (BP Location: Left Arm)   Pulse (!) 110   Temp 98 F (36.7 C) (Oral)   Resp 16   Wt 137 lb 5.6 oz (62.3 kg)   SpO2 100%   BMI 22.17 kg/m   HEMODYNAMICS:    VENTILATOR SETTINGS:    INTAKE / OUTPUT: I/O last 3 completed shifts: In: 6 [P.O.:240; IV Piggyback:600] Out: 39 [Urine:500]   EXAM General 84 year old white male no acute distress HENT: NCAT no JVD Pulm: decreased on right no accessory use  Card: RRR no MRG Ext: no edema  Abd: soft not tender GU: voids Neuro: intact and appropriate    LABS  PULMONARY No results for input(s):  PHART, PCO2ART, PO2ART, HCO3, TCO2, O2SAT in the last 168 hours.  Invalid input(s): PCO2, PO2  CBC Recent Labs  Lab 02/27/2017 1106 03/16/17 0347  HGB 9.6* 9.2*  HCT 29.5* 28.5*  WBC 35.7* 36.6*  PLT 349 248    COAGULATION Recent Labs  Lab 03/01/2017 1106  INR 1.38    CARDIAC  No results for input(s): TROPONINI in the last 168 hours. No results for input(s): PROBNP in the last 168 hours.   CHEMISTRY Recent Labs  Lab 03/12/2017 1106 03/16/17 0347  NA 139 142  K 5.5* 5.0  CL 112* 115*  CO2 19* 17*  GLUCOSE 104* 98  BUN 64* 64*  CREATININE 2.92* 2.65*  CALCIUM 7.9* 7.6*   Estimated Creatinine Clearance: 15 mL/min (A) (by C-G formula based on SCr of 2.65 mg/dL (H)).   LIVER Recent Labs  Lab 03/08/2017 1106 03/16/17 0347  AST 1,193* 492*  ALT 772* 590*  ALKPHOS 111 98  BILITOT 0.9 0.9  PROT 6.4* 5.9*  ALBUMIN 2.2* 2.0*  INR 1.38  --     INFECTIOUS Recent Labs  Lab 03/06/2017 1112 02/20/2017 1303 02/19/2017 1939  LATICACIDVEN 1.88 1.69  --   PROCALCITON  --   --  18.11     ENDOCRINE CBG (last 3)  No results for input(s): GLUCAP in the last 72 hours.    IMAGING x48h  - image(s) personally visualized  -   highlighted in bold Ct Abdomen  Pelvis Wo Contrast  Result Date: 03/09/2017 CLINICAL DATA:  Worsening shortness of breath, abnormal chest radiograph with question pneumonia, leukocytosis, sepsis, abnormal LFTs, history C difficile colitis, pulmonary hypertension, persistent atrial fibrillation, diabetes mellitus, prostate cancer, low-grade myelodysplastic syndrome, former smoker EXAM: CT CHEST, ABDOMEN AND PELVIS WITHOUT CONTRAST TECHNIQUE: Multidetector CT imaging of the chest, abdomen and pelvis was performed following the standard protocol without IV contrast. Sagittal and coronal MPR images reconstructed from axial data set. No oral contrast was administered. COMPARISON:  CT chest 02/15/2017, CT abdomen and pelvis 03/10/2016, lumbar spine radiographs  02/15/2017 FINDINGS: CT CHEST FINDINGS Cardiovascular: Atherosclerotic calcifications aorta, proximal great vessels and coronary arteries. Aorta normal caliber. Significant aortic valvular calcification. No pericardial effusion. Enlarged central pulmonary arteries consistent with history of pulmonary arterial hypertension. Mediastinum/Nodes: Calcified lymph nodes RIGHT hilum and mediastinum consistent with old granulomatous disease. Esophagus unremarkable. No enlarged thoracic lymph nodes. Base of cervical region unremarkable. Lungs/Pleura: Atelectasis and peribronchial thickening in LEFT lower lobe. No LEFT pleural effusion. Moderate RIGHT pleural effusion which appears at least partially loculated. Foci of gas are seen within the RIGHT hemithorax as well consistent with hydropneumothorax. Calcified granulomata RIGHT lung. Compressive atelectasis of the RIGHT lung particularly RIGHT lower lobe to lesser degree posterior aspects of the RIGHT upper and RIGHT middle lobes. No additional pulmonary mass. No definite endobronchial lesions. Musculoskeletal: Osseous demineralization. Old RIGHT rib fractures. Chronic appearing height losses of several thoracic vertebra. CT ABDOMEN PELVIS FINDINGS Hepatobiliary: Gallbladder contracted.  Liver unremarkable. Pancreas: Normal appearance Spleen: Atrophic Adrenals/Urinary Tract: Adrenal glands unremarkable. Absent LEFT kidney. Probable small RIGHT renal cysts without gross hydronephrosis or solid mass the ureters and bladder unremarkable. Stomach/Bowel: Normal appendix. Scattered colonic diverticulosis without evidence of diverticulitis. Rectosigmoid wall thickening with stranding of perirectal fat into presacral space. Stomach and bowel loops otherwise normal appearance. Vascular/Lymphatic: Atherosclerotic calcifications without aneurysm. Tortuous iliac arteries with atherosclerotic calcification extending into common femoral arteries. No adenopathy. Reproductive: Minimal  prostatic enlargement Other: No free air or free fluid.  No hernia. Musculoskeletal: Osseous demineralization. Superior endplate compression fracture L1 as noted on recent lumbar spine radiographs. IMPRESSION: Moderate RIGHT pleural effusion, suspect loculated, with additional component of loculated hydropneumothorax laterally, cannot exclude empyema in patient with history of pneumonia and sepsis. Compressive atelectasis of RIGHT lung with central chronic bronchitic changes, old granulomatous disease, and mild LEFT basilar atelectasis. Enlargement of cardiac chambers with central pulmonary arterial enlargement consistent with pulmonary hypertension. Scattered colonic diverticulosis. Wall thickening of rectosigmoid colon with perirectal edema compatible with colitis, could represent infection or inflammatory bowel disease. Small RIGHT renal cysts and evidence of prior LEFT nephrectomy. Aortic Atherosclerosis (ICD10-I70.0). Findings called to Dr. Melina Copa On 03/04/2017 at 1545 hrs. Electronically Signed   By: Lavonia Dana M.D.   On: 03/14/2017 15:45   Dg Chest 1 View  Result Date: 02/24/2017 CLINICAL DATA:  Weakness and recent pneumonia EXAM: CHEST 1 VIEW COMPARISON:  Multiple exams, including 02/15/2017 FINDINGS: Significantly worsened right pleural effusion, questionably loculated. Lobular low-density over the margins of the effusion could be from overlying aerated lung or less likely gas within the pleural cavity. Hazy density in the right hemithorax likely primarily from the pleural effusion, superimposed pneumonia is not excluded. Mildly improved aeration at the left lung base. The indistinctness of the pulmonary vasculature is improved, at least on the left. Atherosclerotic calcification of the aortic arch. IMPRESSION: 1. Significant worsening in hazy opacity and suspected pleural effusion on the right. Superimposed pneumonia not excluded. Gas density along the pleural effusion  probably represents aerated lung  projecting over potentially loculated pleural effusion, a small amount of gas in the pleural space is a less likely differential diagnostic consideration. Empyema is not excluded. 2. Mildly improved aeration at the left lung base. 3.  Atherosclerotic calcification of the aortic arch. Electronically Signed   By: Van Clines M.D.   On: 02/21/2017 11:43   Ct Chest Wo Contrast  Result Date: 02/26/2017 CLINICAL DATA:  Worsening shortness of breath, abnormal chest radiograph with question pneumonia, leukocytosis, sepsis, abnormal LFTs, history C difficile colitis, pulmonary hypertension, persistent atrial fibrillation, diabetes mellitus, prostate cancer, low-grade myelodysplastic syndrome, former smoker EXAM: CT CHEST, ABDOMEN AND PELVIS WITHOUT CONTRAST TECHNIQUE: Multidetector CT imaging of the chest, abdomen and pelvis was performed following the standard protocol without IV contrast. Sagittal and coronal MPR images reconstructed from axial data set. No oral contrast was administered. COMPARISON:  CT chest 02/15/2017, CT abdomen and pelvis 03/10/2016, lumbar spine radiographs 02/15/2017 FINDINGS: CT CHEST FINDINGS Cardiovascular: Atherosclerotic calcifications aorta, proximal great vessels and coronary arteries. Aorta normal caliber. Significant aortic valvular calcification. No pericardial effusion. Enlarged central pulmonary arteries consistent with history of pulmonary arterial hypertension. Mediastinum/Nodes: Calcified lymph nodes RIGHT hilum and mediastinum consistent with old granulomatous disease. Esophagus unremarkable. No enlarged thoracic lymph nodes. Base of cervical region unremarkable. Lungs/Pleura: Atelectasis and peribronchial thickening in LEFT lower lobe. No LEFT pleural effusion. Moderate RIGHT pleural effusion which appears at least partially loculated. Foci of gas are seen within the RIGHT hemithorax as well consistent with hydropneumothorax. Calcified granulomata RIGHT lung. Compressive  atelectasis of the RIGHT lung particularly RIGHT lower lobe to lesser degree posterior aspects of the RIGHT upper and RIGHT middle lobes. No additional pulmonary mass. No definite endobronchial lesions. Musculoskeletal: Osseous demineralization. Old RIGHT rib fractures. Chronic appearing height losses of several thoracic vertebra. CT ABDOMEN PELVIS FINDINGS Hepatobiliary: Gallbladder contracted.  Liver unremarkable. Pancreas: Normal appearance Spleen: Atrophic Adrenals/Urinary Tract: Adrenal glands unremarkable. Absent LEFT kidney. Probable small RIGHT renal cysts without gross hydronephrosis or solid mass the ureters and bladder unremarkable. Stomach/Bowel: Normal appendix. Scattered colonic diverticulosis without evidence of diverticulitis. Rectosigmoid wall thickening with stranding of perirectal fat into presacral space. Stomach and bowel loops otherwise normal appearance. Vascular/Lymphatic: Atherosclerotic calcifications without aneurysm. Tortuous iliac arteries with atherosclerotic calcification extending into common femoral arteries. No adenopathy. Reproductive: Minimal prostatic enlargement Other: No free air or free fluid.  No hernia. Musculoskeletal: Osseous demineralization. Superior endplate compression fracture L1 as noted on recent lumbar spine radiographs. IMPRESSION: Moderate RIGHT pleural effusion, suspect loculated, with additional component of loculated hydropneumothorax laterally, cannot exclude empyema in patient with history of pneumonia and sepsis. Compressive atelectasis of RIGHT lung with central chronic bronchitic changes, old granulomatous disease, and mild LEFT basilar atelectasis. Enlargement of cardiac chambers with central pulmonary arterial enlargement consistent with pulmonary hypertension. Scattered colonic diverticulosis. Wall thickening of rectosigmoid colon with perirectal edema compatible with colitis, could represent infection or inflammatory bowel disease. Small RIGHT renal  cysts and evidence of prior LEFT nephrectomy. Aortic Atherosclerosis (ICD10-I70.0). Findings called to Dr. Melina Copa On 02/26/2017 at 1545 hrs. Electronically Signed   By: Lavonia Dana M.D.   On: 03/04/2017 15:45    ASSESSMENT and PLAN Empyema  FTT HCAP  Deconditioning AKI Metabolic acidosis Abnormal LFTs Protein cal malnutrition DNR status    Empyema of right pleural space (HCC) I suspect his failure to thrive in the last few weeks is likely due to empyema of the right pleural space. Do NOT think he is candidate for VATS.  Spoke at length w/ his daughter who is concerned about the quality of his life and his recent downward turn. I would recommend CT guided drainage w/ catheter to be placed, then possible TPA/DNase.   Plan Cont current abx NOT surgical candidate  Will contact IR if patient and family amendable I would prefer to only put him thru one procedure & then if incomplete drainage 3d of TPA and DNase --> This could even serve as a palliative intervention,.  Will await daughter to call me back.     Erick Colace ACNP-BC [place] Pager # [phone] OR # [phone] if no answer

## 2017-03-16 NOTE — Progress Notes (Signed)
Initial Nutrition Assessment  DOCUMENTATION CODES:   Non-severe (moderate) malnutrition in context of chronic illness  INTERVENTION:   - Recommend liberalizing diet as pt has poor appetite and poor PO intake - Ensure Enlive po TID, each supplement provides 350 kcal and 20 grams of protein (provides more calories and protein than Glucerna) - D/C Glucerna oral nutrition supplement - Continue MVI with minerals daily  NUTRITION DIAGNOSIS:   Moderate Malnutrition related to chronic illness(CKD stage 3, history of cancer) as evidenced by moderate fat depletion, severe muscle depletion.  GOAL:   Patient will meet greater than or equal to 90% of their needs  MONITOR:   PO intake, Supplement acceptance, Weight trends  REASON FOR ASSESSMENT:   Malnutrition Screening Tool   ASSESSMENT:   84 year old male admitted on 03/08/2017 from a rehab center with sepsis secondary to pneumonia. Pt with PMH significant for recent pneumonia (beginning of February), recent C. difficile colitis diagnosis, anemia, CKD stage 3, DM, hx of cancer (kidney, skin, prostate), and HTN.  03/09/2017 - CT of chest showed right-sided loculated effusion, no plan for surgery at this time but possible diagnositc thoracentesis and tube thoracostomy  Spoke with pt at bedside (no family present at time of visit). Pt reports poor appetite for several months PTA. Pt states that he has "not felt like eating" due to low energy levels. Pt reports typically eating 3 meals daily but is unable to elaborate as to what these meals include. Pt with unfinished breakfast tray in room at time of visit. Pt states he is not very hungry. RD witness pt picking at food on his tray.  Pt agreeable to receiving Ensure Enlive oral nutrition supplement rather than Glucerna for more calories and protein.  Pt endorses recent weight lost. Pt states his UBW is 140 lbs and that he last weighed this "a while ago." Pt unable to provide more specific  information. Per wt history in chart, pt has experienced a 6.6% weight loss in the past 4 months, not significant for timeframe.  Medications reviewed and include: Prosight MVI daily, MVI with minerals daily, 250 mg Florastor BID  Labs reviewed: CO2 17 (L), BUN 64 (H), creatinine 2.65 (H), calcium 7.6 (L), AST 492 (H), ALT 590 (H)  NUTRITION - FOCUSED PHYSICAL EXAM:    Most Recent Value  Orbital Region  Moderate depletion  Upper Arm Region  Moderate depletion  Thoracic and Lumbar Region  Severe depletion  Buccal Region  Moderate depletion  Temple Region  Moderate depletion  Clavicle Bone Region  Severe depletion  Clavicle and Acromion Bone Region  Severe depletion  Scapular Bone Region  Unable to assess  Dorsal Hand  Moderate depletion  Patellar Region  Severe depletion  Anterior Thigh Region  Severe depletion  Posterior Calf Region  Moderate depletion  Edema (RD Assessment)  None  Hair  Reviewed  Eyes  Reviewed  Mouth  Reviewed  Skin  Reviewed  Nails  Reviewed       Diet Order:  Diet Heart Room service appropriate? Yes; Fluid consistency: Thin  EDUCATION NEEDS:   No education needs have been identified at this time  Skin:  Skin Assessment: Reviewed RN Assessment  Last BM:  03/16/17 large type 7  Height:   Ht Readings from Last 1 Encounters:  02/15/17 5\' 6"  (1.676 m)    Weight:   Wt Readings from Last 1 Encounters:  03/07/2017 137 lb 5.6 oz (62.3 kg)    Ideal Body Weight:  64.5 kg  BMI:  Body mass index is 22.17 kg/m.  Estimated Nutritional Needs:   Kcal:  1550-1750 kcal/day  Protein:  75-90 grams/day  Fluid:  > 1.9 L/day    Gaynell Face, MS, RD, LDN Pager: [phone] Weekend/After Hours: [phone]

## 2017-03-16 NOTE — H&P (Signed)
Chief Complaint: Loculated pleural effusion  Referring Physician(s): Salvadore Dom  Supervising Physician: Aletta Edouard  Patient Status: [facility] - In-pt  History of Present Illness: Alexander Barnett is a 84 y.o. male with recent pneumonia who is admitted with failure to thrive secondary to loculated right effusion, hydropneumothorax suspicious for empyema.  He is not a candidate for surgery and we are asked to evaluate for chest tube drainage with plan for consideration of DNA, TPAse via chest tube.   I have discussed with the patient's daughter who is not sure if they want to go through with the procedure.  They are considering palliation and hospice.   I explained to the daughter that the chest tube can be considered a palliative measure for relief of symptoms.   Patient himself wants to get this done. The daughter tells me she doesn't think he is capable of making medical decisions.   Past Medical History:  Diagnosis Date  . Anemia   . Aortic stenosis    moderate by echo 12/2015  . Arthritis    Fingers  . BPH (benign prostatic hyperplasia)   . Bradycardia   . CKD (chronic kidney disease) stage 3, GFR 30-59 ml/min (HCC)   . Diabetes mellitus without complication ([place])    prediabetes diet controlled  . Diastolic dysfunction   . Diverticulosis of colon   . Erectile dysfunction   . Hematuria   . History of kidney cancer   . History of skin cancer   . Hx of transfusion   . Hypertension   . Persistent atrial fibrillation ([place]) 06/2006   refused anticoagulation  . Prostate cancer ([place])   . Pulmonary HTN ([place]) 12/23/2015   Moderate with PASP 8mmHg  . Transitional cell carcinoma of bladder (HCC)    and renal pelvis    Past Surgical History:  Procedure Laterality Date  . CYSTOSCOPY N/A 06/24/2012   Procedure: CYSTOSCOPY;  Surgeon: Dutch Gray, MD;  Location: [facility];  Service: Urology;  Laterality: N/A;  . CYSTOSCOPY N/A 08/05/2012   Procedure: CYSTOSCOPY;  Surgeon:  Dutch Gray, MD;  Location: [facility];  Service: Urology;  Laterality: N/A;  CYSTO, CLOT EVACUATION, FULGERATION OF BLEEDING AREAS AT BLADDER NECK AND PROSTATIC URETHRA    . EYE SURGERY  02/2004   bil.cataract w/ lens implant  . HERNIA REPAIR  05/2002   right  . hyperbaric treatments     40 treatments for prostate-to try to stop bleeding  . imrt radiation  04/2006   for prostate cancer  . MOHS SURGERY  02/2002   nose  . NEPHRECTOMY  03/2007   left  . PROSTATE SURGERY  12/2003  . PROSTATE SURGERY  03/1998   surgery in prostate area  . targus  04/1998   to prostate  . TONSILLECTOMY     as child  . TRANSURETHRAL RESECTION OF BLADDER TUMOR N/A 03/29/2014   Procedure: CYSTOSCOPY, CLOT EVACUATION, RIGHT RETROGRADE, TRANSURETHRAL RESECTION OF BLADDER TUMOR (TURBT);  Surgeon: Ardis Hughs, MD;  Location: [facility];  Service: Urology;  Laterality: N/A;  . TRANSURETHRAL RESECTION OF PROSTATE N/A 06/24/2012   Procedure: TRANSURETHRAL RESECTION OF THE PROSTATE (TURP);  Surgeon: Dutch Gray, MD;  Location: [facility];  Service: Urology;  Laterality: N/A;  CYSTOSCOPY, TRANSURETHRAL RESECTION OF PROSTATIC URETHRAL TUMOR   . URINARY SURGERY  03/1998   bleeding    Allergies: Amoxicillin and Ciprofloxacin  Medications: Prior to Admission medications   Medication Sig Start Date End Date Taking? Authorizing Provider  acetaminophen (TYLENOL)  325 MG tablet Take 2 tablets (650 mg total) by mouth every 6 (six) hours as needed for mild pain or moderate pain. 02/17/17 03/19/17 Yes Elodia Florence., MD  atenolol (TENORMIN) 25 MG tablet Take 1 tablet (25 mg total) by mouth daily. Patient needs to keep 03/02/17 appointment for further refills 01/30/17  Yes Turner, Eber Hong, MD  cholecalciferol (VITAMIN D) 1000 UNITS tablet Take 1,000 Units by mouth daily.   Yes [provider]  Coenzyme Q10 (CO Q-10) 100 MG CAPS Take 100 mg by mouth daily.    Yes [provider]  diphenoxylate-atropine (LOMOTIL)  2.5-0.025 MG tablet Take 1 tablet by mouth daily as needed for diarrhea or loose stools. 09/22/16  Yes [provider]  feeding supplement, GLUCERNA SHAKE, (GLUCERNA SHAKE) LIQD Take 237 mLs by mouth 3 (three) times daily between meals. 02/17/17 03/19/17 Yes Elodia Florence., MD  finasteride (PROSCAR) 5 MG tablet Take 5 mg by mouth every morning.    Yes [provider]  losartan (COZAAR) 100 MG tablet Take 1 tablet (100 mg total) by mouth daily. 03/05/17  Yes Turner, Eber Hong, MD  magnesium hydroxide (MILK OF MAGNESIA) 400 MG/5ML suspension Take 30 mLs by mouth daily as needed for mild constipation.   Yes [provider]  mirtazapine (REMERON) 15 MG tablet Take 7.5 mg by mouth at bedtime.   Yes [provider]  Multiple Vitamin (MULTIVITAMIN WITH MINERALS) TABS Take 1 tablet by mouth daily.   Yes [provider]  Multiple Vitamins-Minerals (ICAPS AREDS 2) CAPS Take 1 capsule by mouth daily.   Yes [provider]  saccharomyces boulardii (FLORASTOR) 250 MG capsule Take 250 mg by mouth 2 (two) times daily.   Yes [provider]  travoprost, benzalkonium, (TRAVATAN) 0.004 % ophthalmic solution Place 1 drop into both eyes at bedtime.   Yes [provider]     History reviewed. No pertinent family history.  Social History   Socioeconomic History  . Marital status: Widowed    Spouse name: None  . Number of children: None  . Years of education: None  . Highest education level: None  Social Needs  . Financial resource strain: None  . Food insecurity - worry: None  . Food insecurity - inability: None  . Transportation needs - medical: None  . Transportation needs - non-medical: None  Occupational History  . None  Tobacco Use  . Smoking status: Former Smoker    Packs/day: 1.50    Years: 20.00    Pack years: 30.00    Types: Cigarettes    Last attempt to quit: 06/21/1962    Years since quitting: 54.7  . Smokeless tobacco:  Never Used  Substance and Sexual Activity  . Alcohol use: Yes    Alcohol/week: 2.4 oz    Types: 4 Glasses of wine per week    Comment: glass wine  occassionally  . Drug use: No  . Sexual activity: No  Other Topics Concern  . None  Social History Narrative  . None    Review of Systems: A 12 point ROS discussed  Review of Systems  Constitutional: Positive for activity change, fatigue and unexpected weight change.  HENT: Negative.   Respiratory: Positive for cough and shortness of breath.   Cardiovascular: Negative.   Gastrointestinal: Negative.   Genitourinary: Negative.   Musculoskeletal: Negative.   Skin: Negative.   Neurological: Negative.   Hematological: Negative.   Psychiatric/Behavioral: Negative.     Vital Signs:  BP 121/73 (BP Location: Left Arm)   Pulse 99   Temp 98.9 F (37.2 C) (Oral)   Resp 16   Wt 137 lb 5.6 oz (62.3 kg)   SpO2 99%   BMI 22.17 kg/m   Physical Exam  Constitutional: He is oriented to person, place, and time.  Elderly, NAD  HENT:  Head: Normocephalic and atraumatic.  Eyes: EOM are normal.  Neck: Normal range of motion.  Cardiovascular: Normal rate, regular rhythm and normal heart sounds.  Pulmonary/Chest: Effort normal.  Diminished breath sounds on the right  Musculoskeletal: Normal range of motion.  Neurological: He is alert and oriented to person, place, and time.  Skin: Skin is warm and dry.  Psychiatric: He has a normal mood and affect. His behavior is normal. Judgment and thought content normal.  Vitals reviewed.   Imaging: Ct Abdomen Pelvis Wo Contrast  Result Date: 02/18/2017 CLINICAL DATA:  Worsening shortness of breath, abnormal chest radiograph with question pneumonia, leukocytosis, sepsis, abnormal LFTs, history C difficile colitis, pulmonary hypertension, persistent atrial fibrillation, diabetes mellitus, prostate cancer, low-grade myelodysplastic syndrome, former smoker EXAM: CT CHEST, ABDOMEN AND PELVIS WITHOUT  CONTRAST TECHNIQUE: Multidetector CT imaging of the chest, abdomen and pelvis was performed following the standard protocol without IV contrast. Sagittal and coronal MPR images reconstructed from axial data set. No oral contrast was administered. COMPARISON:  CT chest 02/15/2017, CT abdomen and pelvis 03/10/2016, lumbar spine radiographs 02/15/2017 FINDINGS: CT CHEST FINDINGS Cardiovascular: Atherosclerotic calcifications aorta, proximal great vessels and coronary arteries. Aorta normal caliber. Significant aortic valvular calcification. No pericardial effusion. Enlarged central pulmonary arteries consistent with history of pulmonary arterial hypertension. Mediastinum/Nodes: Calcified lymph nodes RIGHT hilum and mediastinum consistent with old granulomatous disease. Esophagus unremarkable. No enlarged thoracic lymph nodes. Base of cervical region unremarkable. Lungs/Pleura: Atelectasis and peribronchial thickening in LEFT lower lobe. No LEFT pleural effusion. Moderate RIGHT pleural effusion which appears at least partially loculated. Foci of gas are seen within the RIGHT hemithorax as well consistent with hydropneumothorax. Calcified granulomata RIGHT lung. Compressive atelectasis of the RIGHT lung particularly RIGHT lower lobe to lesser degree posterior aspects of the RIGHT upper and RIGHT middle lobes. No additional pulmonary mass. No definite endobronchial lesions. Musculoskeletal: Osseous demineralization. Old RIGHT rib fractures. Chronic appearing height losses of several thoracic vertebra. CT ABDOMEN PELVIS FINDINGS Hepatobiliary: Gallbladder contracted.  Liver unremarkable. Pancreas: Normal appearance Spleen: Atrophic Adrenals/Urinary Tract: Adrenal glands unremarkable. Absent LEFT kidney. Probable small RIGHT renal cysts without gross hydronephrosis or solid mass the ureters and bladder unremarkable. Stomach/Bowel: Normal appendix. Scattered colonic diverticulosis without evidence of diverticulitis.  Rectosigmoid wall thickening with stranding of perirectal fat into presacral space. Stomach and bowel loops otherwise normal appearance. Vascular/Lymphatic: Atherosclerotic calcifications without aneurysm. Tortuous iliac arteries with atherosclerotic calcification extending into common femoral arteries. No adenopathy. Reproductive: Minimal prostatic enlargement Other: No free air or free fluid.  No hernia. Musculoskeletal: Osseous demineralization. Superior endplate compression fracture L1 as noted on recent lumbar spine radiographs. IMPRESSION: Moderate RIGHT pleural effusion, suspect loculated, with additional component of loculated hydropneumothorax laterally, cannot exclude empyema in patient with history of pneumonia and sepsis. Compressive atelectasis of RIGHT lung with central chronic bronchitic changes, old granulomatous disease, and mild LEFT basilar atelectasis. Enlargement of cardiac chambers with central pulmonary arterial enlargement consistent with pulmonary hypertension. Scattered colonic diverticulosis. Wall thickening of rectosigmoid colon with perirectal edema compatible with colitis, could represent infection or inflammatory bowel disease. Small RIGHT renal cysts and evidence of prior LEFT nephrectomy. Aortic Atherosclerosis (ICD10-I70.0). Findings called  to Dr. Melina Copa On 02/19/2017 at 1545 hrs. Electronically Signed   By: Lavonia Dana M.D.   On: 03/05/2017 15:45   Dg Chest 1 View  Result Date: 03/08/2017 CLINICAL DATA:  Weakness and recent pneumonia EXAM: CHEST 1 VIEW COMPARISON:  Multiple exams, including 02/15/2017 FINDINGS: Significantly worsened right pleural effusion, questionably loculated. Lobular low-density over the margins of the effusion could be from overlying aerated lung or less likely gas within the pleural cavity. Hazy density in the right hemithorax likely primarily from the pleural effusion, superimposed pneumonia is not excluded. Mildly improved aeration at the left lung  base. The indistinctness of the pulmonary vasculature is improved, at least on the left. Atherosclerotic calcification of the aortic arch. IMPRESSION: 1. Significant worsening in hazy opacity and suspected pleural effusion on the right. Superimposed pneumonia not excluded. Gas density along the pleural effusion probably represents aerated lung projecting over potentially loculated pleural effusion, a small amount of gas in the pleural space is a less likely differential diagnostic consideration. Empyema is not excluded. 2. Mildly improved aeration at the left lung base. 3.  Atherosclerotic calcification of the aortic arch. Electronically Signed   By: Van Clines M.D.   On: 02/26/2017 11:43   Dg Chest 2 View  Result Date: 02/15/2017 CLINICAL DATA:  Unwitnessed fall at home last night complains of mid low back pain, worse with movement, recent hx of pneumonia with ongoing cough, congestion, sob EXAM: CHEST  2 VIEW COMPARISON:  02/08/2017 FINDINGS: Retrocardiac airspace opacity, stable. Increase in airspace opacity in the right middle lobe. Increase in indistinctness of the upper zone pulmonary vasculature. Atherosclerotic calcification of the aortic arch. Borderline enlargement of the cardiopericardial silhouette. Blunting of both costophrenic angles. Fullness of the right infrahilar region. IMPRESSION: 1. Right middle lobe and left lower lobe airspace opacities. Multilobar pneumonia not excluded. 2. Right infrahilar fullness could be from adenopathy or an infrahilar lesion. Consider chest CT. 3. Borderline enlargement of the cardiopericardial silhouette with cephalization of blood flow compatible with pulmonary venous hypertension. 4.  Aortic Atherosclerosis (ICD10-I70.0). Electronically Signed   By: Van Clines M.D.   On: 02/15/2017 08:14   Dg Lumbar Spine Complete  Result Date: 02/15/2017 CLINICAL DATA:  84 y/o  M; fall onto back with lower back pain. EXAM: LUMBAR SPINE - COMPLETE 4+ VIEW  COMPARISON:  03/10/2016 CT abdomen and pelvis. FINDINGS: Abdominal aortic calcific atherosclerosis. Mild lumbar levocurvature with apex at L3. Mild interval loss of height of L1 vertebral body. Moderate L2-3 and severe L3-L5 loss of intervertebral disc space height. Normal lumbar lordosis without listhesis. Lower lumbar facet arthrosis. IMPRESSION: 1. Mild interval loss of height of L1 vertebral body may represent acute compression deformity. 2. Stable lumbar spine spondylosis. 3. Aortic atherosclerosis. Electronically Signed   By: Kristine Garbe M.D.   On: 02/15/2017 02:29   Ct Chest Wo Contrast  Result Date: 02/22/2017 CLINICAL DATA:  Worsening shortness of breath, abnormal chest radiograph with question pneumonia, leukocytosis, sepsis, abnormal LFTs, history C difficile colitis, pulmonary hypertension, persistent atrial fibrillation, diabetes mellitus, prostate cancer, low-grade myelodysplastic syndrome, former smoker EXAM: CT CHEST, ABDOMEN AND PELVIS WITHOUT CONTRAST TECHNIQUE: Multidetector CT imaging of the chest, abdomen and pelvis was performed following the standard protocol without IV contrast. Sagittal and coronal MPR images reconstructed from axial data set. No oral contrast was administered. COMPARISON:  CT chest 02/15/2017, CT abdomen and pelvis 03/10/2016, lumbar spine radiographs 02/15/2017 FINDINGS: CT CHEST FINDINGS Cardiovascular: Atherosclerotic calcifications aorta, proximal great vessels and coronary arteries. Aorta  normal caliber. Significant aortic valvular calcification. No pericardial effusion. Enlarged central pulmonary arteries consistent with history of pulmonary arterial hypertension. Mediastinum/Nodes: Calcified lymph nodes RIGHT hilum and mediastinum consistent with old granulomatous disease. Esophagus unremarkable. No enlarged thoracic lymph nodes. Base of cervical region unremarkable. Lungs/Pleura: Atelectasis and peribronchial thickening in LEFT lower lobe. No LEFT  pleural effusion. Moderate RIGHT pleural effusion which appears at least partially loculated. Foci of gas are seen within the RIGHT hemithorax as well consistent with hydropneumothorax. Calcified granulomata RIGHT lung. Compressive atelectasis of the RIGHT lung particularly RIGHT lower lobe to lesser degree posterior aspects of the RIGHT upper and RIGHT middle lobes. No additional pulmonary mass. No definite endobronchial lesions. Musculoskeletal: Osseous demineralization. Old RIGHT rib fractures. Chronic appearing height losses of several thoracic vertebra. CT ABDOMEN PELVIS FINDINGS Hepatobiliary: Gallbladder contracted.  Liver unremarkable. Pancreas: Normal appearance Spleen: Atrophic Adrenals/Urinary Tract: Adrenal glands unremarkable. Absent LEFT kidney. Probable small RIGHT renal cysts without gross hydronephrosis or solid mass the ureters and bladder unremarkable. Stomach/Bowel: Normal appendix. Scattered colonic diverticulosis without evidence of diverticulitis. Rectosigmoid wall thickening with stranding of perirectal fat into presacral space. Stomach and bowel loops otherwise normal appearance. Vascular/Lymphatic: Atherosclerotic calcifications without aneurysm. Tortuous iliac arteries with atherosclerotic calcification extending into common femoral arteries. No adenopathy. Reproductive: Minimal prostatic enlargement Other: No free air or free fluid.  No hernia. Musculoskeletal: Osseous demineralization. Superior endplate compression fracture L1 as noted on recent lumbar spine radiographs. IMPRESSION: Moderate RIGHT pleural effusion, suspect loculated, with additional component of loculated hydropneumothorax laterally, cannot exclude empyema in patient with history of pneumonia and sepsis. Compressive atelectasis of RIGHT lung with central chronic bronchitic changes, old granulomatous disease, and mild LEFT basilar atelectasis. Enlargement of cardiac chambers with central pulmonary arterial enlargement  consistent with pulmonary hypertension. Scattered colonic diverticulosis. Wall thickening of rectosigmoid colon with perirectal edema compatible with colitis, could represent infection or inflammatory bowel disease. Small RIGHT renal cysts and evidence of prior LEFT nephrectomy. Aortic Atherosclerosis (ICD10-I70.0). Findings called to Dr. Melina Copa On 03/06/2017 at 1545 hrs. Electronically Signed   By: Lavonia Dana M.D.   On: 03/01/2017 15:45   Ct Chest Wo Contrast  Result Date: 02/15/2017 CLINICAL DATA:  Cough.  Fall. EXAM: CT CHEST WITHOUT CONTRAST TECHNIQUE: Multidetector CT imaging of the chest was performed following the standard protocol without IV contrast. COMPARISON:  Chest radiograph 02/15/2017 FINDINGS: Cardiovascular: Normal heart size. Aortic atherosclerosis. Calcification in the LAD and left circumflex coronary arteries noted. No pericardial effusion. Mediastinum/Nodes: Calcified mediastinal and right hilar lymph nodes identified. The trachea appears patent and is midline. Normal appearance of the esophagus. No axillary or supraclavicular adenopathy. Lungs/Pleura: Small bilateral pleural effusions. Airspace consolidation is identified in both lung bases, left greater than right. Atelectasis noted in the right middle lobe and anterolateral right lower lobe. Calcified granuloma identified within the right upper lobe. Upper Abdomen: Status post left nephrectomy. No acute findings noted within the upper abdomen. Musculoskeletal: The bones appear diffusely osteopenic. Chronic healed right lateral rib deformities identified. No acute displaced rib deformities. Mild scoliosis and degenerative disc disease. Age-indeterminate compression fractures are noted involving T9 and L1. The L1 compression fracture may be acute. The T9 fracture appears more chronic. IMPRESSION: 1. Bilateral lower lobe airspace opacities compatible with pneumonia. Small pleural effusions are noted. 2. T9 and L1 compression deformities.  The L1 compression fracture may be acute. 3. Aortic Atherosclerosis (ICD10-I70.0). LAD and left circumflex coronary artery calcifications. Electronically Signed   By: Kerby Moors M.D.   On: 02/15/2017  10:01    Labs:  CBC: Recent Labs    02/16/17 0357 02/17/17 0439 03/04/2017 1106 03/16/17 0347  WBC 12.0* 10.0 35.7* 36.6*  HGB 8.1* 8.5* 9.6* 9.2*  HCT 25.1* 26.6* 29.5* 28.5*  PLT 391 419* 349 248    COAGS: Recent Labs    02/20/2017 1106  INR 1.38    BMP: Recent Labs    02/16/17 0357 02/17/17 0439 03/08/2017 1106 03/16/17 0347  NA 136 135 139 142  K 3.6 3.9 5.5* 5.0  CL 108 108 112* 115*  CO2 23 21* 19* 17*  GLUCOSE 93 82 104* 98  BUN 15 13 64* 64*  CALCIUM 7.6* 7.7* 7.9* 7.6*  CREATININE 1.17 1.07 2.92* 2.65*  GFRNONAA 51* 57* 17* 19*  GFRAA 60* >60 20* 22*    LIVER FUNCTION TESTS: Recent Labs    02/15/17 0622 02/16/17 0357 03/14/2017 1106 03/16/17 0347  BILITOT 0.6 0.4 0.9 0.9  AST 26 20 1,193* 492*  ALT 18 14* 772* 590*  ALKPHOS 71 64 111 98  PROT 6.3* 5.6* 6.4* 5.9*  ALBUMIN 3.0* 2.5* 2.2* 2.0*    TUMOR MARKERS: No results for input(s): AFPTM, CEA, CA199, CHROMGRNA in the last 8760 hours.  Assessment and Plan:  Loculated pleural effusion.  Request for placement of pigtail chest tube.  Risks and benefits discussed with the patient and his daughter via telephone including bleeding, infection, damage to adjacent structures, need for additional procedures, and sepsis.  She is unsure she would like [place] to proceed and wants time to discuss it with other family members.  Will hold off on pigtail chest tube for now.  Thank you for this interesting consult.  I greatly enjoyed meeting [employer] and look forward to participating in their care.  A copy of this report was sent to the requesting provider on this date.  Electronically Signed: Murrell Redden, PA-C 03/16/2017, 3:49 PM   I spent a total of 40 Minutes in face to face in clinical  consultation, greater than 50% of which was counseling/coordinating care for pigtail chest tube placement.

## 2017-03-16 NOTE — Clinical Social Work Note (Signed)
Clinical Social Work Assessment  Patient Details  Name: Alexander Barnett MRN: [id number] Date of Birth: 07-01-1920  Date of referral:  03/16/17               Reason for consult:  (admitted from facility)                Permission sought to share information with:  Family Supports Permission granted to share information::  Yes, Verbal Permission Granted  Name::     daughter Roswell Miners, son Treshaun  Agency::  [facility] , [facility]  Relationship::     Contact Information:     Housing/Transportation Living arrangements for the past 2 months:  Single Family Home(but was at [facility] for past month for rehab) Source of Information:  Patient, Adult Children, Facility, Medical Team Patient Interpreter Needed:  None Criminal Activity/Legal Involvement Pertinent to Current Situation/Hospitalization:  No - Comment as needed Significant Relationships:  Adult Children, Warehouse manager Lives with:  Facility Resident Do you feel safe going back to the place where you live?  Yes Need for family participation in patient care:  Yes (Comment)(children highly involved)  Care giving concerns:  Pt admitted from [facility] where he has been for short term rehab for the past month. Was to move into [facility] yesterday but instead was admitted to hospital -"84 year old with recent pneumonia admitted with failure to thrive secondary to loculated right effusion, hydropneumothorax suspicious for empyema" Pt's daughter explains that pt and family have been considering palliative/hospice care for pt going forward and are unsure they want thoracentesis or what palliative measures they would want.    Social Worker assessment / plan:  CSW consulted to assist with disposition as pt is admitted from facility- [facility]. Had completed rehab over past month and was scheduled to move into [facility] on day of hospital admission.  Met with pt at bedside- he was alert to person and place but  was very drowsy and struggled to engage with CSW. Did report plans above and advised CSW speak with daughter. Spoke with daughter via phone- she lives out of state and is en route to [place] to be at hospital to assist pt with care decisions (See above). States "I want to be sure he understands what these procedures mean before we move forward with anything. We have been moving toward hospice or palliative for the last little bit and want to make sure this aligns." Daughter acknowledges much of care plan needs to be decided but that she is anticipating pt's wish at DC may be to DC to ALF with hospice care.  CSW also spoke with [facility]- should pt need to return to rehab, would need new eval/authorization as pt completed rehab. Discussed with daughter and she is strongly against plan for more rehab for pt.  Will follow during hospitalization to assist with planning.    Employment status:  Retired Nurse, adult PT Recommendations:  Not assessed at this time Information / Referral to community resources:     Patient/Family's Response to care:  Pt's response unremarkable due to drowsiness. Daughter very engaged  Patient/Family's Understanding of and Emotional Response to Diagnosis, Current Treatment, and Prognosis:  Unable to fully assess pt's understanding as he was very drowsy at time of assessment. Emotionally was stable. Daughter demonstrates good understanding and acknowledges she has questions to discuss with providers once she arrives at hospital in order to better plan. Emotionally admits to being overwhelmed but feels  confident with plan to come to hospital to assist pt and care team.    Emotional Assessment Appearance:  Appears stated age Attitude/Demeanor/Rapport:  (engaged but slow to respond) Affect (typically observed):  Calm(drowsy) Orientation:  Oriented to Place, Oriented to Self Alcohol / Substance use:  Not Applicable Psych involvement (Current and  /or in the community):  No (Comment)  Discharge Needs  Concerns to be addressed:  Decision making concerns, Care Coordination, Discharge Planning Concerns Readmission within the last 30 days:  Yes Current discharge risk:  (still assessing) Barriers to Discharge:  Continued Medical Work up   [employer], LCSW 03/16/2017, 12:29 PM [phone]

## 2017-03-16 NOTE — Progress Notes (Signed)
Pharmacy Antibiotic Note  Alexander Barnett is a 84 y.o. male admitted on 03/05/2017 with pneumonia.  Pharmacy has been consulted for vanc/cefepime dosing.  Plan: 1) Vancomycin 1250mg  IV x 1 then 500mg  IV q36 - goal AUC 488 using Scr 2.65 2) Cefepime 1g IV q24  Weight: 137 lb 5.6 oz (62.3 kg)  Temp (24hrs), Avg:97.9 F (36.6 C), Min:97.8 F (36.6 C), Max:98 F (36.7 C)  Recent Labs  Lab 02/25/2017 1106 02/16/2017 1112 02/28/2017 1303 03/16/17 0347  WBC 35.7*  --   --  36.6*  CREATININE 2.92*  --   --  2.65*  LATICACIDVEN  --  1.88 1.69  --     Estimated Creatinine Clearance: 15 mL/min (A) (by C-G formula based on SCr of 2.65 mg/dL (H)).    Allergies  Allergen Reactions  . Amoxicillin Diarrhea    Has patient had a PCN reaction causing immediate rash, facial/tongue/throat swelling, SOB or lightheadedness with hypotension: no Has patient had a PCN reaction causing severe rash involving mucus membranes or skin necrosis: no Has patient had a PCN reaction that required hospitalization: unknown Has patient had a PCN reaction occurring within the last 10 years: no If all of the above answers are "NO", then may proceed with Cephalosporin use.  . Ciprofloxacin Other (See Comments)    Muscle strain in groin     Thank you for allowing pharmacy to be a part of this patient's care.  Eudelia Bunch, Pharm.D. [phone] 03/16/2017 10:52 AM

## 2017-03-16 DEATH — deceased

## 2017-03-17 LAB — CBC
HEMATOCRIT: 26.5 % — AB (ref 39.0–52.0)
Hemoglobin: 8.5 g/dL — ABNORMAL LOW (ref 13.0–17.0)
MCH: 25.9 pg — AB (ref 26.0–34.0)
MCHC: 32.1 g/dL (ref 30.0–36.0)
MCV: 80.8 fL (ref 78.0–100.0)
Platelets: 221 10*3/uL (ref 150–400)
RBC: 3.28 MIL/uL — ABNORMAL LOW (ref 4.22–5.81)
RDW: 21.3 % — ABNORMAL HIGH (ref 11.5–15.5)
WBC: 29.2 10*3/uL — ABNORMAL HIGH (ref 4.0–10.5)

## 2017-03-17 LAB — URINALYSIS, ROUTINE W REFLEX MICROSCOPIC
BILIRUBIN URINE: NEGATIVE
Glucose, UA: NEGATIVE mg/dL
HGB URINE DIPSTICK: NEGATIVE
KETONES UR: NEGATIVE mg/dL
Nitrite: NEGATIVE
Protein, ur: 30 mg/dL — AB
Renal Epithelial: 73
Specific Gravity, Urine: 1.016 (ref 1.005–1.030)
pH: 6 (ref 5.0–8.0)

## 2017-03-17 LAB — HEPATITIS PANEL, ACUTE
HCV Ab: <0.1 s/co ratio (ref 0.0–0.9)
HEP A IGM: NEGATIVE
Hep B C IgM: NEGATIVE
Hepatitis B Surface Ag: NEGATIVE

## 2017-03-17 LAB — COMPREHENSIVE METABOLIC PANEL
ALT: 428 U/L — ABNORMAL HIGH (ref 17–63)
ANION GAP: 8 (ref 5–15)
AST: 201 U/L — ABNORMAL HIGH (ref 15–41)
Albumin: 1.9 g/dL — ABNORMAL LOW (ref 3.5–5.0)
Alkaline Phosphatase: 94 U/L (ref 38–126)
BILIRUBIN TOTAL: 0.9 mg/dL (ref 0.3–1.2)
BUN: 65 mg/dL — ABNORMAL HIGH (ref 6–20)
CO2: 19 mmol/L — ABNORMAL LOW (ref 22–32)
CREATININE: 2.6 mg/dL — AB (ref 0.61–1.24)
Calcium: 7.8 mg/dL — ABNORMAL LOW (ref 8.9–10.3)
Chloride: 116 mmol/L — ABNORMAL HIGH (ref 101–111)
GFR, EST AFRICAN AMERICAN: 23 mL/min — AB (ref >60)
GFR, EST NON AFRICAN AMERICAN: 20 mL/min — AB (ref >60)
Glucose, Bld: 150 mg/dL — ABNORMAL HIGH (ref 65–99)
Potassium: 4.5 mmol/L (ref 3.5–5.1)
Sodium: 143 mmol/L (ref 135–145)
TOTAL PROTEIN: 5.8 g/dL — AB (ref 6.5–8.1)

## 2017-03-17 LAB — MAGNESIUM: Magnesium: 2.2 mg/dL (ref 1.7–2.4)

## 2017-03-17 MED ORDER — LACTATED RINGERS IV SOLN
INTRAVENOUS | Status: DC
  Administered 2017-03-17 (×2): via INTRAVENOUS

## 2017-03-17 NOTE — Progress Notes (Addendum)
PROGRESS NOTE    Alexander Barnett  KGM:[id number] DOB: [birth year] DOA: 03/12/2017 PCP: Jonathon Jordan, MD   Brief Narrative:  Per HPI Alexander Barnett is Alexander Barnett 84 y.o. male with medical history significant of recent [facility] pneumonia hospitalized beginning of February and treated with cefepime and oral Vantin at discharge.  Patient went to rehab center.  All of his history is obtained from his son who is present.  Apparently in the summer he suffered from C. difficile colitis which was treated and resolved.  Then he got this pneumonia last month and after he started antibiotics he subsequently was diagnosed again with C. difficile and has been started on oral vancomycin.  This is not reflected on his med rec sheet despite pharmacy review.  We do not have Atheena Spano any records from his rehab center.  Apparently he has deteriorated over the last month or so and is gotten to the point where he is bedbound.  He was sent here apparently for worsening shortness of breath.  Patient's only complaint is shortness of breath.  It is unknown if he has been running fevers.  He is still having diarrhea.  He has not had any nausea or vomiting.  He still has Lamondre Wesche productive cough.  Today chest x-ray shows Leita Lindbloom possible loculated pneumonia and Ezequiel Macauley white count of 35.  His blood pressure is stable.  Patient is getting [place] [facility] CT of his abdomen chest and pelvis which is pending.  Patient is being referred for admission for sepsis.  Assessment & Plan:   Principal Problem:   Empyema of right pleural space (HCC) Active Problems:   Persistent atrial fibrillation (HCC)   CKD (chronic kidney disease) stage 3, GFR 30-59 ml/min (HCC)   Aortic stenosis   Diastolic dysfunction   Pulmonary HTN (HCC)   MDS (myelodysplastic syndrome), low grade (HCC)   Sepsis (HCC)   C. difficile colitis   HCAP (healthcare-associated pneumonia)   SOB (shortness of breath)   Hepatitis   FTT (failure to thrive) in adult   Sepsis  Loculated R Pleural Effusion   Hydropneumothorax:  Pt with suspected empyema on imaging.   Family planning to go forward to procedure, but at this point, looks like it will probably not be until Monday with IR.  Appreciate PCCM recs  Discussed with palliative today, as planning to proceed with procedure, will hold off on consult  Vanc (2/28 - 3/2) Cefepime (2/28 - ) MRSA PCR negative, will d/c vanc Negative urine strep Continue IVF Procalcitonin 18 at presentation Normal lactate  Acute Hypoxic Respiratory Failure: likely 2/2 above, follow with abx and drainage  C diff colitis: not clear to me history of this infection.  No records in report.  Imaging was notable for perirectal edema.  Sounds like he may have been at least partially treated.  Tried to call [facility] for records, but was unable to get information regarding this.  Given history and colitis on imaging, will continue abx for now, but follow outside records as well.  Fidaxomicin 2/28 -    Delirium: likely 2/2 above.  More alert this morning.  Didn't know month, but knew why he was here and where he was today. Will continue to monitor on abx and IVF. - delirium precautions    Elevated liver Enzymes: had I maging with normal appearing liver, contracted gallbladder.  Labs improving today.  Suspect 2/2 sepsis above.  Downtrending with IVF.  Continue IVF Hepatitis panel negative  FTT (failure to thrive) in adult- suspect 2/2 empyema  above  Atrial Fibrillation: Some intermittent tachycardia.  Improved on atenolol.  Continue IVF.  Follow.   AKI on CKD (chronic kidney disease) stage 3, GFR 30-59 ml/min (HCC)- Baseline creatinine looks to be 1-1.4.  secondary to volume depleted state and sepsis above.  Improving slowly (essentially unchanged from yesterday to today).  Follow with IVF.  Follow urinalysis.  CT imaging on presentation without evidence of hydro.  Stop losartan  NAGMA: 2/2 aki, follow  Leukocytosis: 2/2 above.  Improving.  Diastolic  Dysfunction  Aortic stenosis  Pulmonary Hypertension - grade 2 diastolic dysfunction on echo 1/25 as well as moderate to severe AS.  Follow outpatient.    DVT prophylaxis: SCD Code Status: DNR Family Communication: none at bedside Disposition Plan: pending   Consultants:   PCCM  Palliative  Procedures:   none  Antimicrobials:  Anti-infectives (From admission, onward)   Start     Dose/Rate Route Frequency Ordered Stop   03/17/17 1600  vancomycin (VANCOCIN) 500 mg in sodium chloride 0.9 % 100 mL IVPB  Status:  Discontinued     500 mg 100 mL/hr over 60 Minutes Intravenous Every 48 hours 02/19/2017 1418 03/16/17 1051   03/17/17 0300  vancomycin (VANCOCIN) 500 mg in sodium chloride 0.9 % 100 mL IVPB     500 mg 100 mL/hr over 60 Minutes Intravenous Every 36 hours 03/16/17 1051     03/16/17 1500  ceFEPIme (MAXIPIME) 1 g in sodium chloride 0.9 % 100 mL IVPB     1 g 200 mL/hr over 30 Minutes Intravenous Every 24 hours 02/21/2017 1418     03/10/2017 2000  fidaxomicin (DIFICID) tablet 200 mg     200 mg Oral 2 times daily 03/02/2017 1422     02/21/2017 1430  vancomycin (VANCOCIN) 1,250 mg in sodium chloride 0.9 % 250 mL IVPB     1,250 mg 166.7 mL/hr over 90 Minutes Intravenous  Once 03/14/2017 1418 03/09/2017 1817   02/22/2017 1430  ceFEPIme (MAXIPIME) 1 g in sodium chloride 0.9 % 100 mL IVPB     1 g 200 mL/hr over 30 Minutes Intravenous  Once 03/09/2017 1418 03/14/2017 1531     Subjective: Denies pain or SOB. Feels like he's been here Vonnetta Akey while, wants to get things done sooner.  Objective: Vitals:   03/16/17 2048 03/17/17 0421 03/17/17 0426 03/17/17 1300  BP: 130/70 112/67  109/61  Pulse: (!) 108 (!) 101  99  Resp: 18 18  18   Temp: 99.8 F (37.7 C) 98.2 F (36.8 C)  97.7 F (36.5 C)  TempSrc: Oral Oral  Oral  SpO2: 100% 99%  98%  Weight:   62.4 kg (137 lb 9.1 oz)     Intake/Output Summary (Last 24 hours) at 03/17/2017 1610 Last data filed at 03/17/2017 0424 Gross per 24 hour  Intake 475  ml  Output 350 ml  Net 125 ml   Filed Weights   03/09/2017 1400 03/17/17 0426  Weight: 62.3 kg (137 lb 5.6 oz) 62.4 kg (137 lb 9.1 oz)    Examination:  General: No acute distress. Cardiovascular: Heart sounds show Brynnley Dayrit regular rate, and rhythm. No gallops or rubs. No murmurs. No JVD. Lungs: Decreased breath sounds to RLL, no increased WOB Abdomen: Soft, nontender, nondistended with normal active bowel sounds. No masses. No hepatosplenomegaly. Neurological: Alert and oriented 2 (didn't know month). Moves all extremities 4 . Cranial nerves II through XII grossly intact. Skin: Warm and dry. No rashes or lesions. Extremities: No clubbing or cyanosis. No  edema.  Psychiatric: Mood and affect are normal. Insight and judgment are appropriate.   Data Reviewed: I have personally reviewed following labs and imaging studies  CBC: Recent Labs  Lab 02/23/2017 1106 03/16/17 0347 03/17/17 0342  WBC 35.7* 36.6* 29.2*  NEUTROABS 33.2* 33.7  --   HGB 9.6* 9.2* 8.5*  HCT 29.5* 28.5* 26.5*  MCV 79.5 79.2 80.8  PLT 349 248 161   Basic Metabolic Panel: Recent Labs  Lab 02/20/2017 1106 03/16/17 0347 03/17/17 0342  NA 139 142 143  K 5.5* 5.0 4.5  CL 112* 115* 116*  CO2 19* 17* 19*  GLUCOSE 104* 98 150*  BUN 64* 64* 65*  CREATININE 2.92* 2.65* 2.60*  CALCIUM 7.9* 7.6* 7.8*  MG  --   --  2.2   GFR: Estimated Creatinine Clearance: 15.3 mL/min (Kennisha Qin) (by C-G formula based on SCr of 2.6 mg/dL (H)). Liver Function Tests: Recent Labs  Lab 02/19/2017 1106 03/16/17 0347 03/17/17 0342  AST 1,193* 492* 201*  ALT 772* 590* 428*  ALKPHOS 111 98 94  BILITOT 0.9 0.9 0.9  PROT 6.4* 5.9* 5.8*  ALBUMIN 2.2* 2.0* 1.9*   Recent Labs  Lab 03/14/2017 1106  LIPASE 48   No results for input(s): AMMONIA in the last 168 hours. Coagulation Profile: Recent Labs  Lab 02/16/2017 1106  INR 1.38   Cardiac Enzymes: No results for input(s): CKTOTAL, CKMB, CKMBINDEX, TROPONINI in the last 168 hours. BNP (last 3  results) No results for input(s): PROBNP in the last 8760 hours. HbA1C: No results for input(s): HGBA1C in the last 72 hours. CBG: No results for input(s): GLUCAP in the last 168 hours. Lipid Profile: No results for input(s): CHOL, HDL, LDLCALC, TRIG, CHOLHDL, LDLDIRECT in the last 72 hours. Thyroid Function Tests: No results for input(s): TSH, T4TOTAL, FREET4, T3FREE, THYROIDAB in the last 72 hours. Anemia Panel: No results for input(s): VITAMINB12, FOLATE, FERRITIN, TIBC, IRON, RETICCTPCT in the last 72 hours. Sepsis Labs: Recent Labs  Lab 02/18/2017 1112 03/01/2017 1303 02/24/2017 1939  PROCALCITON  --   --  18.11  LATICACIDVEN 1.88 1.69  --     Recent Results (from the past 240 hour(s))  Culture, blood (routine x 2)     Status: None (Preliminary result)   Collection Time: 03/09/2017 11:05 AM  Result Value Ref Range Status   Specimen Description   Final    BLOOD RIGHT HAND Performed at [facility], [place] [address], [place], [place] [address]    Special Requests   Final    BOTTLES DRAWN AEROBIC AND ANAEROBIC Blood Culture adequate volume Performed at [facility] [address], [place], [place] [address]    Culture   Final    NO GROWTH 2 DAYS Performed at [facility] [address] [place], [place] [address]    Report Status PENDING  Incomplete  Culture, blood (routine x 2)     Status: None (Preliminary result)   Collection Time: 03/09/2017 11:06 AM  Result Value Ref Range Status   Specimen Description   Final    BLOOD RIGHT ANTECUBITAL Performed at [facility] [address], [place], [place] [address]    Special Requests   Final    IN PEDIATRIC BOTTLE Blood Culture adequate volume Performed at [facility] [address] [place], [place] [address]    Culture   Final    NO GROWTH 2 DAYS Performed at [facility]  [address], [place], [place] [address]    Report Status  PENDING  Incomplete  MRSA PCR Screening     Status: None   Collection Time: 03/16/17 10:00 AM  Result Value Ref Range Status   MRSA by PCR NEGATIVE NEGATIVE Final    Comment:        The GeneXpert MRSA Assay (FDA approved for NASAL specimens only), is one component of Sherron Mummert comprehensive MRSA colonization surveillance program. It is not intended to diagnose MRSA infection nor to guide or monitor treatment for MRSA infections. Performed at [facility], [place] [address], [place], [place] [address]          Radiology Studies: No results found.      Scheduled Meds: . atenolol  25 mg Oral Daily  . fidaxomicin  200 mg Oral BID  . mirtazapine  7.5 mg Oral QHS  . multivitamin  1 tablet Oral Daily  . multivitamin with minerals  1 tablet Oral Daily  . saccharomyces boulardii  250 mg Oral BID   Continuous Infusions: . ceFEPime (MAXIPIME) IV 1 g (03/17/17 1444)  . lactated ringers 75 mL/hr at 03/17/17 1015  . vancomycin Stopped (03/17/17 0351)     LOS: 2 days    Time spent: over 30 min    Fayrene Helper, MD [facility] Pager [phone]  If 7PM-7AM, please contact night-coverage [email or web address] Password [email or web address] 03/17/2017, 4:10 PM

## 2017-03-17 NOTE — Progress Notes (Signed)
Discussed case with Dr. Florene Glen this AM.  Family has had time to consider and have decided to move forward with plan for pigtail chest tube.  After discussion with Dr. Florene Glen, will hold at this time on consult.  Please reconsult if we can be of further assistance in the care of Alexander Barnett.  NO CHARGE NOTE  Micheline Rough, MD [place] Team [phone]

## 2017-03-18 ENCOUNTER — Inpatient Hospital Stay (HOSPITAL_COMMUNITY): Payer: Medicare Other

## 2017-03-18 LAB — LACTATE DEHYDROGENASE: LDH: 181 U/L (ref 98–192)

## 2017-03-18 LAB — COMPREHENSIVE METABOLIC PANEL
ALT: 312 U/L — AB (ref 17–63)
AST: 110 U/L — ABNORMAL HIGH (ref 15–41)
Albumin: 1.8 g/dL — ABNORMAL LOW (ref 3.5–5.0)
Alkaline Phosphatase: 99 U/L (ref 38–126)
Anion gap: 8 (ref 5–15)
BILIRUBIN TOTAL: 0.8 mg/dL (ref 0.3–1.2)
BUN: 64 mg/dL — ABNORMAL HIGH (ref 6–20)
CO2: 18 mmol/L — AB (ref 22–32)
CREATININE: 2.51 mg/dL — AB (ref 0.61–1.24)
Calcium: 7.9 mg/dL — ABNORMAL LOW (ref 8.9–10.3)
Chloride: 114 mmol/L — ABNORMAL HIGH (ref 101–111)
GFR, EST AFRICAN AMERICAN: 24 mL/min — AB (ref >60)
GFR, EST NON AFRICAN AMERICAN: 20 mL/min — AB (ref >60)
Glucose, Bld: 113 mg/dL — ABNORMAL HIGH (ref 65–99)
Potassium: 4.5 mmol/L (ref 3.5–5.1)
Sodium: 140 mmol/L (ref 135–145)
Total Protein: 5.8 g/dL — ABNORMAL LOW (ref 6.5–8.1)

## 2017-03-18 LAB — PROTIME-INR
INR: 1.26
PROTHROMBIN TIME: 15.7 s — AB (ref 11.4–15.2)

## 2017-03-18 LAB — CBC
HCT: 26.4 % — ABNORMAL LOW (ref 39.0–52.0)
HEMOGLOBIN: 8.4 g/dL — AB (ref 13.0–17.0)
MCH: 25.6 pg — AB (ref 26.0–34.0)
MCHC: 31.8 g/dL (ref 30.0–36.0)
MCV: 80.5 fL (ref 78.0–100.0)
Platelets: 183 10*3/uL (ref 150–400)
RBC: 3.28 MIL/uL — AB (ref 4.22–5.81)
RDW: 21.4 % — ABNORMAL HIGH (ref 11.5–15.5)
WBC: 24.9 10*3/uL — ABNORMAL HIGH (ref 4.0–10.5)

## 2017-03-18 LAB — MAGNESIUM: Magnesium: 2.2 mg/dL (ref 1.7–2.4)

## 2017-03-18 LAB — APTT: aPTT: 37 seconds — ABNORMAL HIGH (ref 24–36)

## 2017-03-18 MED ORDER — FENTANYL CITRATE (PF) 100 MCG/2ML IJ SOLN
INTRAMUSCULAR | Status: AC
  Filled 2017-03-18: qty 2

## 2017-03-18 MED ORDER — MIDAZOLAM HCL 2 MG/2ML IJ SOLN
INTRAMUSCULAR | Status: AC | PRN
Start: 2017-03-18 — End: 2017-03-18
  Administered 2017-03-18 (×2): 0.5 mg via INTRAVENOUS

## 2017-03-18 MED ORDER — NALOXONE HCL 0.4 MG/ML IJ SOLN
INTRAMUSCULAR | Status: AC
  Filled 2017-03-18: qty 1

## 2017-03-18 MED ORDER — FLUMAZENIL 0.5 MG/5ML IV SOLN
INTRAVENOUS | Status: AC
  Filled 2017-03-18: qty 5

## 2017-03-18 MED ORDER — FENTANYL CITRATE (PF) 100 MCG/2ML IJ SOLN
INTRAMUSCULAR | Status: AC | PRN
  Administered 2017-03-18 (×2): 25 ug via INTRAVENOUS

## 2017-03-18 MED ORDER — MIDAZOLAM HCL 2 MG/2ML IJ SOLN
INTRAMUSCULAR | Status: AC
  Filled 2017-03-18: qty 2

## 2017-03-18 NOTE — Sedation Documentation (Signed)
Pt  Began to desaturate at 1544  To 78 % and would not respond to verbal command. He was placed on a non-rebreather and sats improved to 99%, still without response to verbal command. Pt was moved from procedural table to his bed where he did open his eyes and move all 4 extremities and then quickly became lethargic again. He was then given 1mg  of narcan 1600 and he quickly became responsive to verbal command, again moved all 4 extremities, and verbalized his name and where he was at. Pt remained in CT suite and switched to [place] on 4L with a saturation bw 93-99%. He was taken to his room at 1630 with bedside report given to RN.  Pt was awake a/o x4 sitting up right in bed.

## 2017-03-18 NOTE — Progress Notes (Addendum)
Patient after arriving from procedure at change of shift was agitated but alert after receiving Narcan by IR RN.  Besides lethargy and new chest tube, assessment was unchanged from day shift. Chest tube is patent and draining to gravity.  Dressing is clean, dry and intact.  Patient was difficult to arouse for nighttime meds.  MD was notified of difficulty to arouse. Patient's vitals are stable and respiratory rate 18bpm. MD said to hold off on Narcan for now. Patient unable to take nighttime meds. Report was given to RN that came on at 11pm.  She will assess and continue to monitor overnight. Roderick Pee

## 2017-03-18 NOTE — Progress Notes (Addendum)
PROGRESS NOTE    [name]  VVO:[id number] DOB: 01-16-1921 DOA: 02/21/2017 PCP: Jonathon Jordan, MD   Brief Narrative:  Per HPI Alexander Barnett is a 84 y.o. male with medical history significant of recent [facility] pneumonia hospitalized beginning of February and treated with cefepime and oral Vantin at discharge.  Patient went to rehab center.  All of his history is obtained from his son who is present.  Apparently in the summer he suffered from C. difficile colitis which was treated and resolved.  Then he got this pneumonia last month and after he started antibiotics he subsequently was diagnosed again with C. difficile and has been started on oral vancomycin.  This is not reflected on his med rec sheet despite pharmacy review.  We do not have a any records from his rehab center.  Apparently he has deteriorated over the last month or so and is gotten to the point where he is bedbound.  He was sent here apparently for worsening shortness of breath.  Patient's only complaint is shortness of breath.  It is unknown if he has been running fevers.  He is still having diarrhea.  He has not had any nausea or vomiting.  He still has a productive cough.  Today chest x-ray shows a possible loculated pneumonia and a white count of 35.  His blood pressure is stable.  Patient is getting a CT of his abdomen chest and pelvis which is pending.  Patient is being referred for admission for sepsis.  Assessment & Plan:   Principal Problem:   Empyema of right pleural space (HCC) Active Problems:   Persistent atrial fibrillation (HCC)   CKD (chronic kidney disease) stage 3, GFR 30-59 ml/min (HCC)   Aortic stenosis   Diastolic dysfunction   Pulmonary HTN (HCC)   MDS (myelodysplastic syndrome), low grade (HCC)   Sepsis (HCC)   C. difficile colitis   HCAP (healthcare-associated pneumonia)   SOB (shortness of breath)   Hepatitis   FTT (failure to thrive) in adult   Sepsis  Loculated R Pleural Effusion   Hydropneumothorax:  Pt with suspected empyema on imaging.   IR for drain placement today  Appreciate PCCM recs  Hold off on palliative c/s  Vanc (2/28 - 3/2) Cefepime (2/28 - ) MRSA PCR negative, will d/c vanc Negative urine strep Continue IVF Procalcitonin 18 at presentation Normal lactate  Acute Hypoxic Respiratory Failure: likely 2/2 above, follow with abx and drainage  C diff colitis: not clear to me history of this infection.  No records in report, but able to clarify a bit with family and friend in room today.  Sounds like he had 14 days of vancomycin. Imaging here is notable for wall thickening compatible with perirectal edema.  Abdominal exam is benign.  For now will continue dificin, but will send gi pathogen panel and c diff as well.  Pending results, would be reasonable to continue abx for prophylaxis.    Fidaxomicin 2/28 -    Delirium: likely 2/2 above.  Seems to be improving. Will continue to monitor on abx and IVF. - delirium precautions    Elevated liver Enzymes: had I maging with normal appearing liver, contracted gallbladder.  Labs improving today.  Suspect 2/2 sepsis above.  Downtrending with IVF.  Continue IVF Hepatitis panel negative  FTT (failure to thrive) in adult- suspect 2/2 empyema above  Atrial Fibrillation: Some intermittent tachycardia.  Improved on atenolol.  Continue IVF.  Follow.   AKI on CKD (chronic kidney disease) stage  3, GFR 30-59 ml/min (HCC)- Baseline creatinine looks to be 1-1.4.  secondary to volume depleted state and sepsis above.  Improving slowly (essentially unchanged from yesterday to today).  Follow with IVF.  Follow urinalysis.  CT imaging on presentation without evidence of hydro.  Stop losartan  NAGMA: 2/2 aki, follow  Leukocytosis: 2/2 above.  Improving.  Pyuria: follow urine cx  Diastolic Dysfunction  Aortic stenosis  Pulmonary Hypertension - grade 2 diastolic dysfunction on echo 1/25 as well as moderate to severe AS.   Follow outpatient.    DVT prophylaxis: SCD Code Status: DNR Family Communication: none at bedside Disposition Plan: pending   Consultants:   PCCM  Palliative  Procedures:   none  Antimicrobials:  Anti-infectives (From admission, onward)   Start     Dose/Rate Route Frequency Ordered Stop   03/17/17 1600  vancomycin (VANCOCIN) 500 mg in sodium chloride 0.9 % 100 mL IVPB  Status:  Discontinued     500 mg 100 mL/hr over 60 Minutes Intravenous Every 48 hours 03/04/2017 1418 03/16/17 1051   03/17/17 0300  vancomycin (VANCOCIN) 500 mg in sodium chloride 0.9 % 100 mL IVPB  Status:  Discontinued     500 mg 100 mL/hr over 60 Minutes Intravenous Every 36 hours 03/16/17 1051 03/17/17 1631   03/16/17 1500  ceFEPIme (MAXIPIME) 1 g in sodium chloride 0.9 % 100 mL IVPB     1 g 200 mL/hr over 30 Minutes Intravenous Every 24 hours 02/23/2017 1418     02/25/2017 2000  fidaxomicin (DIFICID) tablet 200 mg     200 mg Oral 2 times daily 03/02/2017 1422     03/01/2017 1430  vancomycin (VANCOCIN) 1,250 mg in sodium chloride 0.9 % 250 mL IVPB     1,250 mg 166.7 mL/hr over 90 Minutes Intravenous  Once 02/26/2017 1418 02/28/2017 1817   02/24/2017 1430  ceFEPIme (MAXIPIME) 1 g in sodium chloride 0.9 % 100 mL IVPB     1 g 200 mL/hr over 30 Minutes Intravenous  Once 02/26/2017 1418 02/20/2017 1531     Subjective: Denies pain. A bit confused, but improved.    Objective: Vitals:   03/17/17 0426 03/17/17 1300 03/17/17 1952 03/18/17 0436  BP:  109/61 103/68 98/62  Pulse:  99 85 72  Resp:  18 16 14   Temp:  97.7 F (36.5 C) 97.9 F (36.6 C) 98 F (36.7 C)  TempSrc:  Oral Oral Oral  SpO2:  98% 99% 98%  Weight: 62.4 kg (137 lb 9.1 oz)       Intake/Output Summary (Last 24 hours) at 03/18/2017 1054 Last data filed at 03/17/2017 1630 Gross per 24 hour  Intake 668.75 ml  Output -  Net 668.75 ml   Filed Weights   03/06/2017 1400 03/17/17 0426  Weight: 62.3 kg (137 lb 5.6 oz) 62.4 kg (137 lb 9.1 oz)     Examination:  General: No acute distress. Cardiovascular: Heart sounds show a regular rate, and rhythm. No gallops or rubs. No murmurs. No JVD. Lungs: Clear to auscultation bilaterally with good air movement. No rales, rhonchi or wheezes. Abdomen: Soft, nontender, nondistended with normal active bowel sounds. No masses. No hepatosplenomegaly. Neurological: Alert and oriented 3. Moves all extremities 4 with equal strength. Cranial nerves II through XII grossly intact. Skin: Warm and dry. No rashes or lesions. Extremities: No clubbing or cyanosis. No edema. Pedal pulses 2+. Psychiatric: Mood and affect are normal. Insight and judgment are appropriate.    Data Reviewed: I have personally  reviewed following labs and imaging studies  CBC: Recent Labs  Lab 02/17/2017 1106 03/16/17 0347 03/17/17 0342 03/18/17 0353  WBC 35.7* 36.6* 29.2* 24.9*  NEUTROABS 33.2* 33.7  --   --   HGB 9.6* 9.2* 8.5* 8.4*  HCT 29.5* 28.5* 26.5* 26.4*  MCV 79.5 79.2 80.8 80.5  PLT 349 248 221 195   Basic Metabolic Panel: Recent Labs  Lab 02/16/2017 1106 03/16/17 0347 03/17/17 0342 03/18/17 0353  NA 139 142 143 140  K 5.5* 5.0 4.5 4.5  CL 112* 115* 116* 114*  CO2 19* 17* 19* 18*  GLUCOSE 104* 98 150* 113*  BUN 64* 64* 65* 64*  CREATININE 2.92* 2.65* 2.60* 2.51*  CALCIUM 7.9* 7.6* 7.8* 7.9*  MG  --   --  2.2 2.2   GFR: Estimated Creatinine Clearance: 15.9 mL/min (A) (by C-G formula based on SCr of 2.51 mg/dL (H)). Liver Function Tests: Recent Labs  Lab 03/12/2017 1106 03/16/17 0347 03/17/17 0342 03/18/17 0353  AST 1,193* 492* 201* 110*  ALT 772* 590* 428* 312*  ALKPHOS 111 98 94 99  BILITOT 0.9 0.9 0.9 0.8  PROT 6.4* 5.9* 5.8* 5.8*  ALBUMIN 2.2* 2.0* 1.9* 1.8*   Recent Labs  Lab 02/20/2017 1106  LIPASE 48   No results for input(s): AMMONIA in the last 168 hours. Coagulation Profile: Recent Labs  Lab 03/12/2017 1106 03/18/17 0353  INR 1.38 1.26   Cardiac Enzymes: No results for  input(s): CKTOTAL, CKMB, CKMBINDEX, TROPONINI in the last 168 hours. BNP (last 3 results) No results for input(s): PROBNP in the last 8760 hours. HbA1C: No results for input(s): HGBA1C in the last 72 hours. CBG: No results for input(s): GLUCAP in the last 168 hours. Lipid Profile: No results for input(s): CHOL, HDL, LDLCALC, TRIG, CHOLHDL, LDLDIRECT in the last 72 hours. Thyroid Function Tests: No results for input(s): TSH, T4TOTAL, FREET4, T3FREE, THYROIDAB in the last 72 hours. Anemia Panel: No results for input(s): VITAMINB12, FOLATE, FERRITIN, TIBC, IRON, RETICCTPCT in the last 72 hours. Sepsis Labs: Recent Labs  Lab 03/06/2017 1112 03/06/2017 1303 03/13/2017 1939  PROCALCITON  --   --  18.11  LATICACIDVEN 1.88 1.69  --     Recent Results (from the past 240 hour(s))  Culture, blood (routine x 2)     Status: None (Preliminary result)   Collection Time: 02/19/2017 11:05 AM  Result Value Ref Range Status   Specimen Description   Final    BLOOD RIGHT HAND Performed at [facility], [place] [address], [place], [place] [address]    Special Requests   Final    BOTTLES DRAWN AEROBIC AND ANAEROBIC Blood Culture adequate volume Performed at [facility] [address] [place], [place] [address]    Culture   Final    NO GROWTH 2 DAYS Performed at [facility] [address] [place], [place] [address]    Report Status PENDING  Incomplete  Culture, blood (routine x 2)     Status: None (Preliminary result)   Collection Time: 03/06/2017 11:06 AM  Result Value Ref Range Status   Specimen Description   Final    BLOOD RIGHT ANTECUBITAL Performed at [facility] [address] [place], [place] [address]    Special Requests   Final    IN PEDIATRIC BOTTLE Blood Culture adequate volume Performed at [facility] [address] [place], [place] [address]    Culture   Final  NO GROWTH 2 DAYS Performed at  [facility], [place] [address], [place], [place] [address]    Report Status PENDING  Incomplete  MRSA PCR Screening     Status: None   Collection Time: 03/16/17 10:00 AM  Result Value Ref Range Status   MRSA by PCR NEGATIVE NEGATIVE Final    Comment:        The GeneXpert MRSA Assay (FDA approved for NASAL specimens only), is one component of a comprehensive MRSA colonization surveillance program. It is not intended to diagnose MRSA infection nor to guide or monitor treatment for MRSA infections. Performed at [facility], [place] [address] [place], [place] [address]          Radiology Studies: No results found.      Scheduled Meds: . atenolol  25 mg Oral Daily  . fidaxomicin  200 mg Oral BID  . mirtazapine  7.5 mg Oral QHS  . multivitamin  1 tablet Oral Daily  . multivitamin with minerals  1 tablet Oral Daily  . saccharomyces boulardii  250 mg Oral BID   Continuous Infusions: . ceFEPime (MAXIPIME) IV Stopped (03/17/17 1630)  . lactated ringers 75 mL/hr at 03/17/17 2210     LOS: 3 days    Time spent: over 30 min    Fayrene Helper, MD [facility] Pager [phone]  If 7PM-7AM, please contact night-coverage [email or web address] Password [facility] 03/18/2017, 10:54 AM

## 2017-03-18 NOTE — Progress Notes (Signed)
Patient ID: Alexander Barnett, male   DOB: 10/29/1920, 84 y.o.   MRN: [id number] Pt scheduled for CT guided rt chest drain placement today. See consult note from 03/16/17 for additional details. Risks and benefits discussed with the patient/daughter including bleeding, infection, damage to adjacent structures, pneumothorax,  and sepsis.  All of the patient's/family's questions were answered, patient is agreeable to proceed. Consent signed and in chart.

## 2017-03-19 ENCOUNTER — Inpatient Hospital Stay (HOSPITAL_COMMUNITY): Payer: Medicare Other

## 2017-03-19 LAB — CBC
HEMATOCRIT: 29.6 % — AB (ref 39.0–52.0)
HEMOGLOBIN: 9.3 g/dL — AB (ref 13.0–17.0)
MCH: 25.5 pg — ABNORMAL LOW (ref 26.0–34.0)
MCHC: 31.4 g/dL (ref 30.0–36.0)
MCV: 81.1 fL (ref 78.0–100.0)
Platelets: 145 10*3/uL — ABNORMAL LOW (ref 150–400)
RBC: 3.65 MIL/uL — AB (ref 4.22–5.81)
RDW: 21.2 % — ABNORMAL HIGH (ref 11.5–15.5)
WBC: 20.1 10*3/uL — ABNORMAL HIGH (ref 4.0–10.5)

## 2017-03-19 LAB — COMPREHENSIVE METABOLIC PANEL
ALBUMIN: 1.9 g/dL — AB (ref 3.5–5.0)
ALK PHOS: 93 U/L (ref 38–126)
ALT: 227 U/L — AB (ref 17–63)
AST: 61 U/L — AB (ref 15–41)
Anion gap: 9 (ref 5–15)
BILIRUBIN TOTAL: 0.9 mg/dL (ref 0.3–1.2)
BUN: 67 mg/dL — AB (ref 6–20)
CALCIUM: 8.1 mg/dL — AB (ref 8.9–10.3)
CO2: 16 mmol/L — ABNORMAL LOW (ref 22–32)
CREATININE: 2.63 mg/dL — AB (ref 0.61–1.24)
Chloride: 117 mmol/L — ABNORMAL HIGH (ref 101–111)
GFR calc Af Amer: 22 mL/min — ABNORMAL LOW (ref >60)
GFR calc non Af Amer: 19 mL/min — ABNORMAL LOW (ref >60)
GLUCOSE: 127 mg/dL — AB (ref 65–99)
Potassium: 5.2 mmol/L — ABNORMAL HIGH (ref 3.5–5.1)
Sodium: 142 mmol/L (ref 135–145)
TOTAL PROTEIN: 5.7 g/dL — AB (ref 6.5–8.1)

## 2017-03-19 LAB — URINALYSIS, ROUTINE W REFLEX MICROSCOPIC
Bilirubin Urine: NEGATIVE
Glucose, UA: NEGATIVE mg/dL
KETONES UR: NEGATIVE mg/dL
Nitrite: NEGATIVE
Protein, ur: 30 mg/dL — AB
Specific Gravity, Urine: 1.016 (ref 1.005–1.030)
pH: 5 (ref 5.0–8.0)

## 2017-03-19 LAB — BASIC METABOLIC PANEL
Anion gap: 7 (ref 5–15)
BUN: 77 mg/dL — AB (ref 6–20)
CHLORIDE: 118 mmol/L — AB (ref 101–111)
CO2: 17 mmol/L — ABNORMAL LOW (ref 22–32)
CREATININE: 2.72 mg/dL — AB (ref 0.61–1.24)
Calcium: 7.9 mg/dL — ABNORMAL LOW (ref 8.9–10.3)
GFR calc Af Amer: 21 mL/min — ABNORMAL LOW (ref >60)
GFR calc non Af Amer: 19 mL/min — ABNORMAL LOW (ref >60)
GLUCOSE: 149 mg/dL — AB (ref 65–99)
POTASSIUM: 5.1 mmol/L (ref 3.5–5.1)
SODIUM: 142 mmol/L (ref 135–145)

## 2017-03-19 LAB — C DIFFICILE QUICK SCREEN W PCR REFLEX
C Diff antigen: POSITIVE — AB
C Diff toxin: NEGATIVE

## 2017-03-19 LAB — GASTROINTESTINAL PANEL BY PCR, STOOL (REPLACES STOOL CULTURE)
Adenovirus F40/41: NOT DETECTED
Astrovirus: NOT DETECTED
CRYPTOSPORIDIUM: NOT DETECTED
CYCLOSPORA CAYETANENSIS: NOT DETECTED
Campylobacter species: NOT DETECTED
ENTEROTOXIGENIC E COLI (ETEC): NOT DETECTED
Entamoeba histolytica: NOT DETECTED
Enteroaggregative E coli (EAEC): NOT DETECTED
Enteropathogenic E coli (EPEC): NOT DETECTED
Giardia lamblia: NOT DETECTED
Norovirus GI/GII: DETECTED — AB
PLESIMONAS SHIGELLOIDES: NOT DETECTED
Rotavirus A: NOT DETECTED
SAPOVIRUS (I, II, IV, AND V): NOT DETECTED
SHIGA LIKE TOXIN PRODUCING E COLI (STEC): NOT DETECTED
Salmonella species: NOT DETECTED
Shigella/Enteroinvasive E coli (EIEC): NOT DETECTED
VIBRIO SPECIES: NOT DETECTED
Vibrio cholerae: NOT DETECTED
YERSINIA ENTEROCOLITICA: NOT DETECTED

## 2017-03-19 LAB — PROTEIN, TOTAL: Total Protein: 5.9 g/dL — ABNORMAL LOW (ref 6.5–8.1)

## 2017-03-19 LAB — CREATININE, URINE, RANDOM: CREATININE, URINE: 133.44 mg/dL

## 2017-03-19 LAB — CLOSTRIDIUM DIFFICILE BY PCR, REFLEXED: CDIFFPCR: POSITIVE — AB

## 2017-03-19 LAB — BODY FLUID CELL COUNT WITH DIFFERENTIAL: WBC FLUID: 424000 uL — AB (ref 0–1000)

## 2017-03-19 LAB — LACTATE DEHYDROGENASE: LDH: 200 U/L — AB (ref 98–192)

## 2017-03-19 LAB — LACTATE DEHYDROGENASE, PLEURAL OR PERITONEAL FLUID: LD, Fluid: 7250 U/L — ABNORMAL HIGH (ref 3–23)

## 2017-03-19 LAB — PROTEIN, PLEURAL OR PERITONEAL FLUID: Total protein, fluid: <3 g/dL

## 2017-03-19 LAB — MAGNESIUM: Magnesium: 2.2 mg/dL (ref 1.7–2.4)

## 2017-03-19 LAB — GLUCOSE, PLEURAL OR PERITONEAL FLUID

## 2017-03-19 LAB — SODIUM, URINE, RANDOM

## 2017-03-19 MED ORDER — DEXTROSE-NACL 5-0.45 % IV SOLN: INTRAVENOUS | Status: DC

## 2017-03-19 MED ORDER — METRONIDAZOLE IN NACL 5-0.79 MG/ML-% IV SOLN
500.0000 mg | Freq: Three times a day (TID) | INTRAVENOUS | Status: DC
  Administered 2017-03-19 – 2017-03-21 (×7): 500 mg via INTRAVENOUS
  Filled 2017-03-19 (×9): qty 100

## 2017-03-19 MED ORDER — SODIUM CHLORIDE 0.9 % IV SOLN
2.0000 g | INTRAVENOUS | Status: DC
  Administered 2017-03-19 – 2017-03-20 (×2): 2 g via INTRAVENOUS
  Filled 2017-03-19 (×3): qty 2

## 2017-03-19 MED ORDER — LINEZOLID 600 MG/300ML IV SOLN
600.0000 mg | Freq: Two times a day (BID) | INTRAVENOUS | Status: DC
  Administered 2017-03-19 – 2017-03-21 (×5): 600 mg via INTRAVENOUS
  Filled 2017-03-19 (×6): qty 300

## 2017-03-19 MED ORDER — VANCOMYCIN 50 MG/ML ORAL SOLUTION
125.0000 mg | Freq: Four times a day (QID) | ORAL | Status: DC
  Administered 2017-03-19 – 2017-03-21 (×10): 125 mg via ORAL
  Filled 2017-03-19 (×11): qty 2.5

## 2017-03-19 MED ORDER — DEXTROSE 5 % IV SOLN
INTRAVENOUS | Status: AC
  Administered 2017-03-19 – 2017-03-20 (×2): via INTRAVENOUS
  Filled 2017-03-19 (×2): qty 150

## 2017-03-19 MED ORDER — SODIUM BICARBONATE 8.4 % IV SOLN: INTRAVENOUS | Status: DC

## 2017-03-19 MED ORDER — VITAMIN B-1 100 MG PO TABS
100.0000 mg | ORAL_TABLET | Freq: Every day | ORAL | Status: DC
  Administered 2017-03-19 – 2017-03-21 (×3): 100 mg via ORAL
  Filled 2017-03-19 (×2): qty 1

## 2017-03-19 NOTE — Progress Notes (Signed)
Pharmacy Antibiotic Note  Alexander Barnett is a 84 y.o. male admitted on 03/14/2017 with pneumonia, Loculated R Pleural Effusion, Hydropneumothorax.  Pharmacy has been consulted for cefepime dosing.  Plan: Increase to Cefepime 2g IV q24h Follow up renal fxn, culture results, and clinical course.   Weight: 137 lb 9.1 oz (62.4 kg)  Temp (24hrs), Avg:97.9 F (36.6 C), Min:97.7 F (36.5 C), Max:98.4 F (36.9 C)  Recent Labs  Lab 02/27/2017 1106 02/20/2017 1112 03/02/2017 1303 03/16/17 0347 03/17/17 0342 03/18/17 0353 03/19/17 0405  WBC 35.7*  --   --  36.6* 29.2* 24.9* 20.1*  CREATININE 2.92*  --   --  2.65* 2.60* 2.51* 2.63*  LATICACIDVEN  --  1.88 1.69  --   --   --   --     Estimated Creatinine Clearance: 15.2 mL/min (A) (by C-G formula based on SCr of 2.63 mg/dL (H)).    Allergies  Allergen Reactions  . Amoxicillin Diarrhea    Has patient had a PCN reaction causing immediate rash, facial/tongue/throat swelling, SOB or lightheadedness with hypotension: no Has patient had a PCN reaction causing severe rash involving mucus membranes or skin necrosis: no Has patient had a PCN reaction that required hospitalization: unknown Has patient had a PCN reaction occurring within the last 10 years: no If all of the above answers are "NO", then may proceed with Cephalosporin use.  . Ciprofloxacin Other (See Comments)    Muscle strain in groin   Antimicrobials this admission: 2/28 vanc >> 3/2 2/28 cefepime >> Fidaxomicin >>  Dose adjustments this admission: 3/1 vanc 500 q48 >> 500 q36 for improved SCr 3/4 Empiric increase to cefepime 2g q24h (initial dosing 2g q8h, for CrCl 11-29, dose reduce to 2g q24h)  Microbiology results: 3/1 MRSA PCR >> negative 2/28 BCx2 >> ngtd  Thank you for allowing pharmacy to be a part of this patient's care.  Gretta Arab PharmD, BCPS Pager [phone] 03/19/2017 11:07 AM

## 2017-03-19 NOTE — Progress Notes (Signed)
CSW received call from pt's daughter to continue discussing pt's disposition plan. (See full assessment 03/16/17). Stated family decided to proceed with chest tube placement yesterday. Long term they are still uncertain if they will opt to pursue palliative/hospice route instead of treatment.  Family has made arrangements for pt to move into [facility] (just completed SNF rehab at [facility] and they are strongly hopeful pt will not need to return to rehab from hospital). Requesting that once closer to [place], pt would receive PT eval to aid them and [facility] in knowing pt's ability to ambulate and care needs. Will continue following to assist with plan.  Sharren Bridge, MSW, LCSW Clinical Social Work 03/19/2017 [phone]

## 2017-03-19 NOTE — Progress Notes (Signed)
[facility] PCCM Rounding Note   S:  RN reports no significant events.  Chest tube draining yellow-brown liquid.      O: Blood pressure 121/69, pulse 87, temperature 97.7 F (36.5 C), temperature source Axillary, resp. rate 18, weight 137 lb 9.1 oz (62.4 kg), SpO2 100 %.  General: frail elderly male in NAD HEENT: MM pink/moist, no jvd Neuro: awakens, HOH, falls back to sleep  CV: s1s2 rrr, no m/r/g PULM: even/non-labored, lungs bilaterally coarse , right chest tube with yellow-brown drainage, fetid odor from pleural fluid  PP:IRJJ, non-tender, bsx4 active  Extremities: warm/dry, no edema  Skin: no rashes or lesions  Recent Labs  Lab 03/17/17 0342 03/18/17 0353 03/19/17 0405  HGB 8.5* 8.4* 9.3*  HCT 26.5* 26.4* 29.6*  WBC 29.2* 24.9* 20.1*  PLT 221 183 145*   Recent Labs  Lab 02/16/2017 1106 03/16/17 0347 03/17/17 0342 03/18/17 0353 03/19/17 0405  NA 139 142 143 140 142  K 5.5* 5.0 4.5 4.5 5.2*  CL 112* 115* 116* 114* 117*  CO2 19* 17* 19* 18* 16*  GLUCOSE 104* 98 150* 113* 127*  BUN 64* 64* 65* 64* 67*  CREATININE 2.92* 2.65* 2.60* 2.51* 2.63*  CALCIUM 7.9* 7.6* 7.8* 7.9* 8.1*  MG  --   --  2.2 2.2 2.2    A: Loculated Right Pleural Effusion - hydropneumothorax suspicious for empyema Recent CAP  Sepsis secondary to above  Pulmonary HTN Failure to Thrive  Recent C-Difficile Colitis  MDS  Grade II Diastolic Dysfunction  Aortic Stenosis - moderate to severe   P: Continue chest tube drainage Fluid pulled off chest tube site to send to lab for culture, cytology, protein, LDH etc Follow intermittent CXR  Continue abx, add zyvox for GPC coverage (AKI, no vanco) Follow chest tube drainage > once slows, plan for CT imaging of the chest to ensure no further loculation.  If loculations, will need tPA/pulmozyme.  Will hold for now given free flowing drainage.    Noe Gens, NP-C [facility] Pgr: [phone] or if no answer [phone] 03/19/2017,  11:49 AM

## 2017-03-19 NOTE — Progress Notes (Addendum)
PROGRESS NOTE    Alexander Barnett  ZOX:[id number] DOB: July 14, 1920 DOA: 03/14/2017 PCP: Jonathon Jordan, MD   Brief Narrative:  Per HPI Alexander Barnett is Alexander Barnett 84 y.o. male with medical history significant of recent [facility] pneumonia hospitalized beginning of February and treated with cefepime and oral Vantin at discharge.  Patient went to rehab center.  All of his history is obtained from his son who is present.  Apparently in the summer he suffered from C. difficile colitis which was treated and resolved.  Then he got this pneumonia last month and after he started antibiotics he subsequently was diagnosed again with C. difficile and has been started on oral vancomycin.  This is not reflected on his med rec sheet despite pharmacy review.  We do not have Kaela Beitz any records from his rehab center.  Apparently he has deteriorated over the last month or so and is gotten to the point where he is bedbound.  He was sent here apparently for worsening shortness of breath.  Patient's only complaint is shortness of breath.  It is unknown if he has been running fevers.  He is still having diarrhea.  He has not had any nausea or vomiting.  He still has Corrinne Benegas productive cough.  Today chest x-ray shows Ilya Neely possible loculated pneumonia and Lovetta Condie white count of 35.  His blood pressure is stable.  Patient is getting Deavion Strider CT of his abdomen chest and pelvis which is pending.  Patient is being referred for admission for sepsis.  Assessment & Plan:   Principal Problem:   Empyema of right pleural space (HCC) Active Problems:   Persistent atrial fibrillation (HCC)   CKD (chronic kidney disease) stage 3, GFR 30-59 ml/min (HCC)   Aortic stenosis   Diastolic dysfunction   Pulmonary HTN (HCC)   MDS (myelodysplastic syndrome), low grade (HCC)   Sepsis (HCC)   C. difficile colitis   HCAP (healthcare-associated pneumonia)   SOB (shortness of breath)   Hepatitis   FTT (failure to thrive) in adult   Sepsis  Loculated R Pleural Effusion   Hydropneumothorax:  Pt with suspected empyema on imaging.   IR for drain placement 3/4 Appreciate PCCM recs  Vanc (2/28 - 3/2) Cefepime (2/28 - ) Follow thora labs (none sent with initial drainage, PCCM going to attempt to collect today) MRSA PCR negative Negative urine strep Continue IVF Procalcitonin 18 at presentation Normal lactate  Goals of Care: will c/s palliative care for goals of care, pt seems to be declining and with poor prognosis  Acute Hypoxic Respiratory Failure: likely 2/2 above, follow with abx and drainage  C diff colitis: not clear to me history of this infection.  No records in report, but able to clarify Melvin Marmo bit with family and friend in room today.  Sounds like he had 14 days of vancomycin. Imaging here is notable for wall thickening compatible with perirectal edema.  Abdominal exam is benign.  GI path and c diff pending.  Will transition to PO vanc for prophylaxis.    Fidaxomicin 2/28 - 3/4 PO vanc 3/4 - (for prophylaxis)  Delirium: Waxing and waning, Anayelli Lai bit tired and confused today. Will continue to monitor on abx and IVF.  Of note, sounds like he received narcan after procedure yesterday and was Mcadoo Muzquiz bit difficult to arouse overnight last night.  Easily arousable today, but still confused.  CTM.  - delirium precautions    Elevated liver Enzymes: had I maging with normal appearing liver, contracted gallbladder.  Labs improving today.  Suspect 2/2 sepsis above.  Continuing to downtrend with IVF.  Continue IVF Hepatitis panel negative  FTT (failure to thrive) in adult- suspect 2/2 empyema above  Atrial Fibrillation: Some intermittent tachycardia.  Improved on atenolol.  Continue IVF.  Follow.   AKI on CKD (chronic kidney disease) stage 3, GFR 30-59 ml/min (HCC)- Baseline creatinine looks to be 1-1.4.  secondary to volume depleted state and sepsis above.  Peaked at 2.92 and then downtrended to 2.51, but up to 2.63 today.  CT imaging on presentation without evidence of  hydro. Continue IVF (bicarb as noted below) Send urine lytes  Stop losartan  NAGMA: worsening today.  CO2 16.  Likely 2/2 renal failure.  Will start D5 bicarb x 24 hrs.   Leukocytosis: 2/2 above.  Improving.  Pyuria: follow urine cx  Hyperkalemia: mild to 5.2, follow  Diastolic Dysfunction  Aortic stenosis  Pulmonary Hypertension - grade 2 diastolic dysfunction on echo 1/25 as well as moderate to severe AS.  Follow outpatient.    DVT prophylaxis: SCD Code Status: DNR Family Communication: none at bedside Disposition Plan: pending   Consultants:   PCCM  Palliative  Procedures:   none  Antimicrobials:  Anti-infectives (From admission, onward)   Start     Dose/Rate Route Frequency Ordered Stop   03/19/17 1400  vancomycin (VANCOCIN) 50 mg/mL oral solution 125 mg     125 mg Oral 4 times daily 03/19/17 1155     03/19/17 1200  ceFEPIme (MAXIPIME) 2 g in sodium chloride 0.9 % 100 mL IVPB     2 g 200 mL/hr over 30 Minutes Intravenous Every 24 hours 03/19/17 1108     03/17/17 1600  vancomycin (VANCOCIN) 500 mg in sodium chloride 0.9 % 100 mL IVPB  Status:  Discontinued     500 mg 100 mL/hr over 60 Minutes Intravenous Every 48 hours 02/27/2017 1418 03/16/17 1051   03/17/17 0300  vancomycin (VANCOCIN) 500 mg in sodium chloride 0.9 % 100 mL IVPB  Status:  Discontinued     500 mg 100 mL/hr over 60 Minutes Intravenous Every 36 hours 03/16/17 1051 03/17/17 1631   03/16/17 1500  ceFEPIme (MAXIPIME) 1 g in sodium chloride 0.9 % 100 mL IVPB  Status:  Discontinued     1 g 200 mL/hr over 30 Minutes Intravenous Every 24 hours 02/21/2017 1418 03/19/17 1108   02/17/2017 2000  fidaxomicin (DIFICID) tablet 200 mg  Status:  Discontinued     200 mg Oral 2 times daily 03/12/2017 1422 03/19/17 1155   02/27/2017 1430  vancomycin (VANCOCIN) 1,250 mg in sodium chloride 0.9 % 250 mL IVPB     1,250 mg 166.7 mL/hr over 90 Minutes Intravenous  Once 02/18/2017 1418 03/07/2017 1817   02/19/2017 1430  ceFEPIme  (MAXIPIME) 1 g in sodium chloride 0.9 % 100 mL IVPB     1 g 200 mL/hr over 30 Minutes Intravenous  Once 02/28/2017 1418 03/14/2017 1531     Subjective: Confused.  Sleepy.    Objective: Vitals:   03/18/17 1715 03/18/17 2150 03/19/17 0404 03/19/17 1043  BP: 94/66 (!) 136/92 125/72 121/69  Pulse: 99 94 86 87  Resp: 18 18 18    Temp:  97.7 F (36.5 C) 97.7 F (36.5 C)   TempSrc:  Axillary Axillary   SpO2: 100% 100% 93% 100%  Weight:        Intake/Output Summary (Last 24 hours) at 03/19/2017 1155 Last data filed at 03/19/2017 8921 Gross per 24 hour  Intake -  Output 290 ml  Net -290 ml   Filed Weights   03/07/2017 1400 03/17/17 0426  Weight: 62.3 kg (137 lb 5.6 oz) 62.4 kg (137 lb 9.1 oz)    Examination:  General: No acute distress. Cardiovascular: Heart sounds show Aerilyn Slee regular rate, and rhythm. No gallops or rubs. No murmurs. No JVD. Lungs: No increased WOB.  Chest tube on R.  Abdomen: Soft, nontender, nondistended with normal active bowel sounds. No masses. No hepatosplenomegaly. Neurological: Alert, but confused. Moves all extremities 4. Cranial nerves II through XII grossly intact. Skin: Warm and dry. No rashes or lesions. Extremities: No clubbing or cyanosis. No edema.      Data Reviewed: I have personally reviewed following labs and imaging studies  CBC: Recent Labs  Lab 03/12/2017 1106 03/16/17 0347 03/17/17 0342 03/18/17 0353 03/19/17 0405  WBC 35.7* 36.6* 29.2* 24.9* 20.1*  NEUTROABS 33.2* 33.7  --   --   --   HGB 9.6* 9.2* 8.5* 8.4* 9.3*  HCT 29.5* 28.5* 26.5* 26.4* 29.6*  MCV 79.5 79.2 80.8 80.5 81.1  PLT 349 248 221 183 532*   Basic Metabolic Panel: Recent Labs  Lab 03/03/2017 1106 03/16/17 0347 03/17/17 0342 03/18/17 0353 03/19/17 0405  NA 139 142 143 140 142  K 5.5* 5.0 4.5 4.5 5.2*  CL 112* 115* 116* 114* 117*  CO2 19* 17* 19* 18* 16*  GLUCOSE 104* 98 150* 113* 127*  BUN 64* 64* 65* 64* 67*  CREATININE 2.92* 2.65* 2.60* 2.51* 2.63*  CALCIUM  7.9* 7.6* 7.8* 7.9* 8.1*  MG  --   --  2.2 2.2 2.2   GFR: Estimated Creatinine Clearance: 15.2 mL/min (Raidon Swanner) (by C-G formula based on SCr of 2.63 mg/dL (H)). Liver Function Tests: Recent Labs  Lab 03/09/2017 1106 03/16/17 0347 03/17/17 0342 03/18/17 0353 03/19/17 0405  AST 1,193* 492* 201* 110* 61*  ALT 772* 590* 428* 312* 227*  ALKPHOS 111 98 94 99 93  BILITOT 0.9 0.9 0.9 0.8 0.9  PROT 6.4* 5.9* 5.8* 5.8* 5.7*  ALBUMIN 2.2* 2.0* 1.9* 1.8* 1.9*   Recent Labs  Lab 03/07/2017 1106  LIPASE 48   No results for input(s): AMMONIA in the last 168 hours. Coagulation Profile: Recent Labs  Lab 02/27/2017 1106 03/18/17 0353  INR 1.38 1.26   Cardiac Enzymes: No results for input(s): CKTOTAL, CKMB, CKMBINDEX, TROPONINI in the last 168 hours. BNP (last 3 results) No results for input(s): PROBNP in the last 8760 hours. HbA1C: No results for input(s): HGBA1C in the last 72 hours. CBG: No results for input(s): GLUCAP in the last 168 hours. Lipid Profile: No results for input(s): CHOL, HDL, LDLCALC, TRIG, CHOLHDL, LDLDIRECT in the last 72 hours. Thyroid Function Tests: No results for input(s): TSH, T4TOTAL, FREET4, T3FREE, THYROIDAB in the last 72 hours. Anemia Panel: No results for input(s): VITAMINB12, FOLATE, FERRITIN, TIBC, IRON, RETICCTPCT in the last 72 hours. Sepsis Labs: Recent Labs  Lab 03/13/2017 1112 02/28/2017 1303 03/09/2017 1939  PROCALCITON  --   --  18.11  LATICACIDVEN 1.88 1.69  --     Recent Results (from the past 240 hour(s))  Culture, blood (routine x 2)     Status: None (Preliminary result)   Collection Time: 03/03/2017 11:05 AM  Result Value Ref Range Status   Specimen Description   Final    BLOOD RIGHT HAND Performed at [facility], [place] [address] [place], [place] [address]    Special Requests   Final    BOTTLES DRAWN  AEROBIC AND ANAEROBIC Blood Culture adequate volume Performed at [place] [address]  [place], [place] [address]    Culture   Final    NO GROWTH 3 DAYS Performed at [facility], [place] [address], [place], [place] [address]    Report Status PENDING  Incomplete  Culture, blood (routine x 2)     Status: None (Preliminary result)   Collection Time: 02/20/2017 11:06 AM  Result Value Ref Range Status   Specimen Description   Final    BLOOD RIGHT ANTECUBITAL Performed at [facility] [address], [place], [place] [address]    Special Requests   Final    IN PEDIATRIC BOTTLE Blood Culture adequate volume Performed at [facility] [address], [place], [place] [address]    Culture   Final    NO GROWTH 3 DAYS Performed at [facility], [place] [address] [place], [place] [address]    Report Status PENDING  Incomplete  MRSA PCR Screening     Status: None   Collection Time: 03/16/17 10:00 AM  Result Value Ref Range Status   MRSA by PCR NEGATIVE NEGATIVE Final    Comment:        The GeneXpert MRSA Assay (FDA approved for NASAL specimens only), is one component of Patrica Mendell comprehensive MRSA colonization surveillance program. It is not intended to diagnose MRSA infection nor to guide or monitor treatment for MRSA infections. Performed at [facility], [place] [address], [place], [place] [address]          Radiology Studies: Dg Chest Port 1 View  Result Date: 03/19/2017 CLINICAL DATA:  Empyema.  Right chest tube. EXAM: PORTABLE CHEST 1 VIEW COMPARISON:  Chest x-ray dated 03/10/2017 and CT scan dated 03/07/2017 FINDINGS: Pigtail drainage catheter is present at the lateral aspect of the right lung base. Small right Pyopneumothorax. Marked decrease in the pleural fluid on the right since the prior CT scan. New atelectasis at the left lung base posterior medially. Heart size and vascularity are normal. Aortic atherosclerosis. IMPRESSION: 1. Decreased right empyema since the prior CT scan. Drainage tube appears in good  position. Small right pyopneumothorax. 2. New atelectasis at the left lung base posterior medially. Electronically Signed   By: Lorriane Shire M.D.   On: 03/19/2017 09:40   Ct Image Guided Fluid Drain By Catheter  Result Date: 03/18/2017 INDICATION: History of right-sided empyema. Please perform CT-guided chest tube placement for infection source control purposes. EXAM: CT IMAGE GUIDED FLUID DRAIN BY CATHETER COMPARISON:  Chest CT - 03/07/2017 MEDICATIONS: The patient is currently admitted to the hospital and receiving intravenous antibiotics. The antibiotics were administered within an appropriate time frame prior to the initiation of the procedure. Narcan 1 mg IV ANESTHESIA/SEDATION: Moderate (conscious) sedation was employed during this procedure. Rogue Rafalski total of Versed 1 mg and Fentanyl 50 mcg was administered intravenously. Moderate Sedation Time: 20 minutes. The patient's level of consciousness and vital signs were monitored continuously by radiology nursing throughout the procedure under my direct supervision. CONTRAST:  None COMPLICATIONS: SIR LEVEL B - Normal therapy, includes overnight admission for observation. Procedure complicated by over sedation requiring administration of Narcan 1 mg IV with prompt improved responsiveness and oxygen saturation. PROCEDURE: Informed written consent was obtained from the patient's family after Neve Branscomb discussion of the risks, benefits and alternatives to treatment. The patient was placed supine, slightly LPO on the CT gantry and April Colter pre procedural CT was  performed re-demonstrating the known loculated right-sided hydropneumothorax. The procedure was planned. Johnston Maddocks timeout was performed prior to the initiation of the procedure. The skin overlying the anterior inferior aspect the right chest was prepped and draped in the usual sterile fashion. The overlying soft tissues were anesthetized with 1% lidocaine with epinephrine. Appropriate trajectory was planned with the use of Venkat Ankney 22 gauge  spinal needle. An 18 gauge trocar needle was advanced into the right pleural space and Serine Kea short Amplatz super stiff wire was coiled within the right pleural space. Appropriate positioning was confirmed with Denessa Cavan limited CT scan. The tract was serially dilated allowing placement of Lakeitha Basques 12 [place] all-purpose drainage catheter. Appropriate positioning was confirmed with Lillyian Heidt limited postprocedural CT scan. Approximately 1 L of purulent, foul smelling fluid was aspirated. The tube was connected to Pearlean Sabina pleural vac device and sutured in place. Eldine Rencher dressing was placed. The patient tolerated the procedure well without immediate post procedural complication. IMPRESSION: 1. Successful CT guided placement of Eustolia Drennen 12 [place] all purpose drain catheter into the right pleural space with aspiration of approximately 1 L of purulent, foul smelling fluid. Samples were sent to the laboratory as requested by the ordering clinical team. 2. Procedure complicated by over sedation requiring administration of Narcan 1 mg IV with prompt improved responsiveness and oxygen saturation. Electronically Signed   By: Sandi Mariscal M.D.   On: 03/18/2017 17:30        Scheduled Meds: . atenolol  25 mg Oral Daily  . mirtazapine  7.5 mg Oral QHS  . multivitamin  1 tablet Oral Daily  . multivitamin with minerals  1 tablet Oral Daily  . saccharomyces boulardii  250 mg Oral BID  . vancomycin  125 mg Oral QID   Continuous Infusions: . ceFEPime (MAXIPIME) IV    . dextrose 5 % and 0.45% NaCl       LOS: 4 days    Time spent: over 30 min    Fayrene Helper, MD [facility] Pager [phone]  If 7PM-7AM, please contact night-coverage [email or web address] Password [email or web address] 03/19/2017, 11:55 AM

## 2017-03-19 NOTE — Care Management Important Message (Addendum)
Important Message  Patient Details IM Letter given to Nora/Case Manager to present to the Patient Name: Erroll Wilbourne MRN: [id number] Date of Birth: 1921-01-11   Medicare Important Message Given:  Yes    Kerin Salen 03/19/2017, 12:16 [place] Message  Patient Details  Name: Zaul Hubers MRN: [id number] Date of Birth: 25-Nov-1920   Medicare Important Message Given:  Yes    Kerin Salen 03/19/2017, 12:16 PM

## 2017-03-19 NOTE — Progress Notes (Signed)
Referring Physician(s): Dr. Florene Glen  Supervising Physician: Sandi Mariscal  Patient Status:  [facility] - In-pt  Chief Complaint: Empyema  Subjective: Sleeping soundly.  Purulent output from chest drain.   Allergies: Amoxicillin and Ciprofloxacin  Medications: Prior to Admission medications   Medication Sig Start Date End Date Taking? Authorizing Provider  acetaminophen (TYLENOL) 325 MG tablet Take 2 tablets (650 mg total) by mouth every 6 (six) hours as needed for mild pain or moderate pain. 02/17/17 03/19/17 Yes Elodia Florence., MD  atenolol (TENORMIN) 25 MG tablet Take 1 tablet (25 mg total) by mouth daily. Patient needs to keep 03/02/17 appointment for further refills 01/30/17  Yes Turner, Eber Hong, MD  cholecalciferol (VITAMIN D) 1000 UNITS tablet Take 1,000 Units by mouth daily.   Yes [provider]  Coenzyme Q10 (CO Q-10) 100 MG CAPS Take 100 mg by mouth daily.    Yes [provider]  diphenoxylate-atropine (LOMOTIL) 2.5-0.025 MG tablet Take 1 tablet by mouth daily as needed for diarrhea or loose stools. 09/22/16  Yes [provider]  feeding supplement, GLUCERNA SHAKE, (GLUCERNA SHAKE) LIQD Take 237 mLs by mouth 3 (three) times daily between meals. 02/17/17 03/19/17 Yes Elodia Florence., MD  finasteride (PROSCAR) 5 MG tablet Take 5 mg by mouth every morning.    Yes [provider]  losartan (COZAAR) 100 MG tablet Take 1 tablet (100 mg total) by mouth daily. 03/05/17  Yes Turner, Eber Hong, MD  magnesium hydroxide (MILK OF MAGNESIA) 400 MG/5ML suspension Take 30 mLs by mouth daily as needed for mild constipation.   Yes [provider]  mirtazapine (REMERON) 15 MG tablet Take 7.5 mg by mouth at bedtime.   Yes [provider]  Multiple Vitamin (MULTIVITAMIN WITH MINERALS) TABS Take 1 tablet by mouth daily.   Yes [provider]  Multiple Vitamins-Minerals (ICAPS AREDS 2) CAPS Take 1 capsule by mouth daily.   Yes [provider]  saccharomyces boulardii (FLORASTOR) 250 MG capsule Take 250 mg by mouth 2 (two) times daily.   Yes [provider]  travoprost, benzalkonium, (TRAVATAN) 0.004 % ophthalmic solution Place 1 drop into both eyes at bedtime.   Yes [provider]     Vital Signs: BP (!) 137/97 (BP Location: Left Arm)   Pulse (!) 102   Temp 98.8 F (37.1 C) (Oral)   Resp 18   Wt 137 lb 9.1 oz (62.4 kg)   SpO2 100%   BMI 22.20 kg/m   Physical Exam  Constitutional: He is oriented to person, place, and time. He appears well-developed.  Cardiovascular: Normal rate and regular rhythm.  Pulmonary/Chest: Effort normal. No respiratory distress.  Chest tube in place. Insertion site intact.  Currently to gravity drainage.  Purulent output in tube.   Abdominal: Soft.  Neurological: He is alert and oriented to person, place, and time.  Skin: Skin is warm and dry.  Nursing note and vitals reviewed.   Imaging: Dg Chest Port 1 View  Result Date: 03/19/2017 CLINICAL DATA:  Empyema.  Right chest tube. EXAM: PORTABLE CHEST 1 VIEW COMPARISON:  Chest x-ray dated 03/08/2017 and CT scan dated 03/07/2017 FINDINGS: Pigtail drainage catheter is present at the lateral aspect of the right lung base. Small right Pyopneumothorax. Marked decrease in the pleural fluid on the right since the prior CT scan. New atelectasis at the left lung base posterior medially. Heart size and vascularity are normal. Aortic atherosclerosis. IMPRESSION: 1. Decreased right empyema since the  prior CT scan. Drainage tube appears in good position. Small right pyopneumothorax. 2. New atelectasis at the left lung base posterior medially. Electronically Signed   By: Lorriane Shire M.D.   On: 03/19/2017 09:40   Ct Image Guided Fluid Drain By Catheter  Result Date: 03/18/2017 INDICATION: History of right-sided empyema. Please perform CT-guided chest tube placement for infection source control purposes. EXAM: CT IMAGE GUIDED  FLUID DRAIN BY CATHETER COMPARISON:  Chest CT - 03/14/2017 MEDICATIONS: The patient is currently admitted to the hospital and receiving intravenous antibiotics. The antibiotics were administered within an appropriate time frame prior to the initiation of the procedure. Narcan 1 mg IV ANESTHESIA/SEDATION: Moderate (conscious) sedation was employed during this procedure. A total of Versed 1 mg and Fentanyl 50 mcg was administered intravenously. Moderate Sedation Time: 20 minutes. The patient's level of consciousness and vital signs were monitored continuously by radiology nursing throughout the procedure under my direct supervision. CONTRAST:  None COMPLICATIONS: SIR LEVEL B - Normal therapy, includes overnight admission for observation. Procedure complicated by over sedation requiring administration of Narcan 1 mg IV with prompt improved responsiveness and oxygen saturation. PROCEDURE: Informed written consent was obtained from the patient's family after a discussion of the risks, benefits and alternatives to treatment. The patient was placed supine, slightly LPO on the CT gantry and a pre procedural CT was performed re-demonstrating the known loculated right-sided hydropneumothorax. The procedure was planned. A timeout was performed prior to the initiation of the procedure. The skin overlying the anterior inferior aspect the right chest was prepped and draped in the usual sterile fashion. The overlying soft tissues were anesthetized with 1% lidocaine with epinephrine. Appropriate trajectory was planned with the use of a 22 gauge spinal needle. An 18 gauge trocar needle was advanced into the right pleural space and a short Amplatz super stiff wire was coiled within the right pleural space. Appropriate positioning was confirmed with a limited CT scan. The tract was serially dilated allowing placement of a 12 [place] all-purpose drainage catheter. Appropriate positioning was confirmed with a limited postprocedural CT  scan. Approximately 1 L of purulent, foul smelling fluid was aspirated. The tube was connected to a pleural vac device and sutured in place. A dressing was placed. The patient tolerated the procedure well without immediate post procedural complication. IMPRESSION: 1. Successful CT guided placement of a 12 [place] all purpose drain catheter into the right pleural space with aspiration of approximately 1 L of purulent, foul smelling fluid. Samples were sent to the laboratory as requested by the ordering clinical team. 2. Procedure complicated by over sedation requiring administration of Narcan 1 mg IV with prompt improved responsiveness and oxygen saturation. Electronically Signed   By: Sandi Mariscal M.D.   On: 03/18/2017 17:30    Labs:  CBC: Recent Labs    03/16/17 0347 03/17/17 0342 03/18/17 0353 03/19/17 0405  WBC 36.6* 29.2* 24.9* 20.1*  HGB 9.2* 8.5* 8.4* 9.3*  HCT 28.5* 26.5* 26.4* 29.6*  PLT 248 221 183 145*    COAGS: Recent Labs    03/10/2017 1106 03/18/17 0353  INR 1.38 1.26  APTT  --  37*    BMP: Recent Labs    03/16/17 0347 03/17/17 0342 03/18/17 0353 03/19/17 0405  NA 142 143 140 142  K 5.0 4.5 4.5 5.2*  CL 115* 116* 114* 117*  CO2 17* 19* 18* 16*  GLUCOSE 98 150* 113* 127*  BUN 64* 65* 64* 67*  CALCIUM 7.6* 7.8* 7.9* 8.1*  CREATININE  2.65* 2.60* 2.51* 2.63*  GFRNONAA 19* 20* 20* 19*  GFRAA 22* 23* 24* 22*    LIVER FUNCTION TESTS: Recent Labs    03/16/17 0347 03/17/17 0342 03/18/17 0353 03/19/17 0405 03/19/17 1302  BILITOT 0.9 0.9 0.8 0.9  --   AST 492* 201* 110* 61*  --   ALT 590* 428* 312* 227*  --   ALKPHOS 98 94 99 93  --   PROT 5.9* 5.8* 5.8* 5.7* 5.9*  ALBUMIN 2.0* 1.9* 1.8* 1.9*  --     Assessment and Plan: Empyema Patient s/p chest drain.  Spoke with CCM NP who states ok to leave to gravity drainage for now.  CXR this AM shows improvement in effusion with small PTX on right.  Sample recollected by CCM to be resent to the lab for  culture. Now pending.   IR to follow.   Electronically Signed: Docia Barrier, PA 03/19/2017, 3:53 PM   I spent a total of 15 Minutes at the the patient's bedside AND on the patient's hospital floor or unit, greater than 50% of which was counseling/coordinating care for empyema.

## 2017-03-20 LAB — CULTURE, BLOOD (ROUTINE X 2)
CULTURE: NO GROWTH
Culture: NO GROWTH
SPECIAL REQUESTS: ADEQUATE
SPECIAL REQUESTS: ADEQUATE

## 2017-03-20 LAB — CBC
HEMATOCRIT: 24.9 % — AB (ref 39.0–52.0)
HEMOGLOBIN: 8 g/dL — AB (ref 13.0–17.0)
MCH: 25.6 pg — ABNORMAL LOW (ref 26.0–34.0)
MCHC: 32.1 g/dL (ref 30.0–36.0)
MCV: 79.6 fL (ref 78.0–100.0)
Platelets: 152 10*3/uL (ref 150–400)
RBC: 3.13 MIL/uL — AB (ref 4.22–5.81)
RDW: 21.3 % — ABNORMAL HIGH (ref 11.5–15.5)
WBC: 12.9 10*3/uL — AB (ref 4.0–10.5)

## 2017-03-20 LAB — COMPREHENSIVE METABOLIC PANEL
ALBUMIN: 1.8 g/dL — AB (ref 3.5–5.0)
ALK PHOS: 84 U/L (ref 38–126)
ALT: 156 U/L — AB (ref 17–63)
ANION GAP: 8 (ref 5–15)
AST: 38 U/L (ref 15–41)
BILIRUBIN TOTAL: 0.7 mg/dL (ref 0.3–1.2)
BUN: 68 mg/dL — AB (ref 6–20)
CALCIUM: 7.8 mg/dL — AB (ref 8.9–10.3)
CO2: 17 mmol/L — AB (ref 22–32)
Chloride: 116 mmol/L — ABNORMAL HIGH (ref 101–111)
Creatinine, Ser: 2.66 mg/dL — ABNORMAL HIGH (ref 0.61–1.24)
GFR calc Af Amer: 22 mL/min — ABNORMAL LOW (ref >60)
GFR calc non Af Amer: 19 mL/min — ABNORMAL LOW (ref >60)
GLUCOSE: 139 mg/dL — AB (ref 65–99)
Potassium: 4.6 mmol/L (ref 3.5–5.1)
SODIUM: 141 mmol/L (ref 135–145)
TOTAL PROTEIN: 5.5 g/dL — AB (ref 6.5–8.1)

## 2017-03-20 LAB — MAGNESIUM: Magnesium: 2.2 mg/dL (ref 1.7–2.4)

## 2017-03-20 LAB — UREA NITROGEN, URINE: UREA NITROGEN UR: 750 mg/dL

## 2017-03-20 NOTE — Progress Notes (Signed)
CSW received call from pt's daughter to discuss disposition plans. Also spoke with Lelan Pons at [facility] (where pt was scheduled to move in on day of admission to hospital)- see previous notes for history. Both daughter and ALF discussed daughter feels family's first wishes would be to have pt move in to [facility], possibly with hospice depending on hospital course, rather than return to a rehab (just completed rehab at [facility] on day of hospital admission and daughter feels returning for more rehab "may be the downfall"), however, if SNF is recommended and pt/family decide to pursue this route instead of hospice/palliative, she is agreeable to considering it. CSW will continue following and assist with disposition plans - daughter reports family's decisions depend on pt's progress and hospital course.   Sharren Bridge, MSW, LCSW Clinical Social Work 03/20/2017 [phone]

## 2017-03-20 NOTE — Progress Notes (Addendum)
PROGRESS NOTE    Alexander Barnett  UKG:[id number] DOB: October 13, 1920 DOA: 02/17/2017 PCP: Jonathon Jordan, MD   Brief Narrative:  Per HPI Alexander Barnett is Alexander Barnett 84 y.o. male with medical history significant of recent [facility] pneumonia hospitalized beginning of February and treated with cefepime and oral Vantin at discharge.  Patient went to rehab center.  All of his history is obtained from his son who is present.  Apparently in the summer he suffered from C. difficile colitis which was treated and resolved.  Then he got this pneumonia last month and after he started antibiotics he subsequently was diagnosed again with C. difficile and has been started on oral vancomycin.  This is not reflected on his med rec sheet despite pharmacy review.  We do not have Alexander Barnett any records from his rehab center.  Apparently he has deteriorated over the last month or so and is gotten to the point where he is bedbound.  He was sent here apparently for worsening shortness of breath.  Patient's only complaint is shortness of breath.  It is unknown if he has been running fevers.  He is still having diarrhea.  He has not had any nausea or vomiting.  He still has Alexander Barnett productive cough.  Today chest x-ray shows Alexander Barnett possible loculated pneumonia and Alexander Barnett white count of 35.  His blood pressure is stable.  Patient is getting Alexander Barnett CT of his abdomen chest and pelvis which is pending.  Patient is being referred for admission for sepsis.  On admission, family was not sure they wanted any invasive procedure, but on the weekend, decided to go forward with chest tube placement which was done on 3/3.  PCCM has been consulted due to the empyema.  Palliative care has now been consulted as well for assistance with goals of care and likely transition to hospice at discharge.  He's on broad spectrum abx, which can be narrowed with culture results.  He's on IV bicarb for NAGMA likely related to his acute kidney injury.    Assessment & Plan:   Principal Problem:   Empyema of right pleural space (HCC) Active Problems:   Persistent atrial fibrillation (HCC)   CKD (chronic kidney disease) stage 3, GFR 30-59 ml/min (HCC)   Aortic stenosis   Diastolic dysfunction   Pulmonary HTN (HCC)   MDS (myelodysplastic syndrome), low grade (HCC)   Sepsis (HCC)   C. difficile colitis   HCAP (healthcare-associated pneumonia)   SOB (shortness of breath)   Hepatitis   FTT (failure to thrive) in adult   Sepsis  Loculated R Pleural Effusion  Hydropneumothorax:   Pt with empyema Pleural fluid cx growing abundant gram negative rods Follow thora labs (cytology pending, pH pending, LDH 7250, protein <3, glucose <20, 424000 WBC) IR for drain placement 3/4 CXR 3/4 with decreased R empyema, small R pyopneumothorax.  Appreciate PCCM recs (follow output from chest tube, when slows plan for repeat CT scan and lytics as needed if loculations) Vanc (2/28 - 3/2) Cefepime (2/28 - ).  Broadened to include flagyl and linezolid (3/4 - ) per PCCM.  MRSA PCR negative Negative urine strep Continue IVF Procalcitonin 18 at presentation Normal lactate  Goals of Care: will c/s palliative care for goals of care  Acute Hypoxic Respiratory Failure: likely 2/2 above, follow with abx and drainage.  Wean O2 as tolerated.   C diff colitis: not clear to me history of this infection.  No records in report, but able to clarify Alexander Barnett with family and  friend in room today.  Sounds like he had 14 days of vancomycin earlier in February which should have been complete. Imaging here is notable for wall thickening compatible with perirectal edema.  Abdominal exam is benign.   GI path notable for norovirus c diff with positive toxigenic C diff with little to no toxin production.  Recommended to only treat if clinical presentation suggests symptomatic illness.  Will continue PO vanc which should be sufficient to treat and also for prophylaxis.  Consider d/c on vanc taper given presence of imaging  findings. Fidaxomicin 2/28 - 3/4 PO vanc 3/4 -   Delirium: Alexander Barnett&Ox3 today. Will continue to monitor on abx and IVF.  Of note, sounds like he received narcan after procedure 3/3 and was Alexander Barnett Barnett difficult to arouse overnight 3/3.  Awake and eating breakfast today - delirium precautions    Elevated liver Enzymes: had I maging with normal appearing liver, contracted gallbladder.  Labs improving today.  Suspect 2/2 sepsis above.  Continuing to downtrend with IVF.  Continue IVF Hepatitis panel negative  FTT (failure to thrive) in adult- suspect 2/2 empyema above  Atrial Fibrillation:  Bradycardia noted this morning.  Stop atenolol.  Check EKG.  AKI on CKD (chronic kidney disease) stage 3, GFR 30-59 ml/min (HCC)- Baseline creatinine looks to be 1-1.4.  secondary to volume depleted state and sepsis above.  Peaked at 2.92 and then downtrended to 2.51, but up to 2.66 today.  CT imaging on presentation without evidence of hydro. Continue IVF (bicarb as noted below) Send urine lytes (prerenal) Stop losartan  NAGMA: Slightly improved with bicarb.  CO2 17.  Likely 2/2 renal failure.  Will continue D5 bicarb x 24 hrs.   Leukocytosis: 2/2 above.  Improving.  Pyuria: follow urine cx.  Abx as above.  Hyperkalemia: mild to 5.2, follow  Diastolic Dysfunction  Aortic stenosis  Pulmonary Hypertension - grade 2 diastolic dysfunction on echo 1/25 as well as moderate to severe AS.  Follow outpatient.    DVT prophylaxis: SCD Code Status: DNR Family Communication: none at bedside Disposition Plan: pending   Consultants:   PCCM  Palliative  Procedures:   CT guided R chest drain 3/3  Antimicrobials:  Anti-infectives (From admission, onward)   Start     Dose/Rate Route Frequency Ordered Stop   03/19/17 1400  vancomycin (VANCOCIN) 50 mg/mL oral solution 125 mg     125 mg Oral 4 times daily 03/19/17 1155     03/19/17 1300  metroNIDAZOLE (FLAGYL) IVPB 500 mg     500 mg 100 mL/hr over 60 Minutes  Intravenous Every 8 hours 03/19/17 1214     03/19/17 1230  linezolid (ZYVOX) IVPB 600 mg     600 mg 300 mL/hr over 60 Minutes Intravenous Every 12 hours 03/19/17 1213     03/19/17 1200  ceFEPIme (MAXIPIME) 2 g in sodium chloride 0.9 % 100 mL IVPB     2 g 200 mL/hr over 30 Minutes Intravenous Every 24 hours 03/19/17 1108     03/17/17 1600  vancomycin (VANCOCIN) 500 mg in sodium chloride 0.9 % 100 mL IVPB  Status:  Discontinued     500 mg 100 mL/hr over 60 Minutes Intravenous Every 48 hours 03/09/2017 1418 03/16/17 1051   03/17/17 0300  vancomycin (VANCOCIN) 500 mg in sodium chloride 0.9 % 100 mL IVPB  Status:  Discontinued     500 mg 100 mL/hr over 60 Minutes Intravenous Every 36 hours 03/16/17 1051 03/17/17 1631   03/16/17 1500  ceFEPIme (MAXIPIME) 1 g in sodium chloride 0.9 % 100 mL IVPB  Status:  Discontinued     1 g 200 mL/hr over 30 Minutes Intravenous Every 24 hours 03/12/2017 1418 03/19/17 1108   03/10/2017 2000  fidaxomicin (DIFICID) tablet 200 mg  Status:  Discontinued     200 mg Oral 2 times daily 03/11/2017 1422 03/19/17 1155   03/02/2017 1430  vancomycin (VANCOCIN) 1,250 mg in sodium chloride 0.9 % 250 mL IVPB     1,250 mg 166.7 mL/hr over 90 Minutes Intravenous  Once 02/26/2017 1418 03/02/2017 1817   03/08/2017 1430  ceFEPIme (MAXIPIME) 1 g in sodium chloride 0.9 % 100 mL IVPB     1 g 200 mL/hr over 30 Minutes Intravenous  Once 03/08/2017 1418 03/03/2017 1531     Subjective: Neno [place]hen[place]&Ox3.  Denies pain.    Objective: Vitals:   03/19/17 1043 03/19/17 1336 03/19/17 2100 03/20/17 0643  BP: 121/69 (!) 137/97 102/73 103/67  Pulse: 87 (!) 102 (!) 58 (!) 45  Resp:  18 17 16   Temp:  98.8 F (37.1 C) 98.1 F (36.7 C) 98 F (36.7 C)  TempSrc:  Oral Oral Oral  SpO2: 100% 100% 94% 100%  Weight:        Intake/Output Summary (Last 24 hours) at 03/20/2017 1007 Last data filed at 03/20/2017 0700 Gross per 24 hour  Intake 2067 ml  Output 480 ml  Net 1587 ml   Filed Weights   02/27/2017 1400 03/17/17  0426  Weight: 62.3 kg (137 lb 5.6 oz) 62.4 kg (137 lb 9.1 oz)    Examination:  General: No acute distress. Cardiovascular: Heart sounds show Sabrie Moritz regular rate, and rhythm. No gallops or rubs. No murmurs. No JVD. Lungs: No increased wob.  Pavo in place.  No adventitious lung sounds appreciated on anterior exam. Chest tube on R.  Abdomen: Soft, nontender, nondistended with normal active bowel sounds. No masses. No hepatosplenomegaly. Neurological: Alert and oriented 3. Moves all extremities 4. Cranial nerves II through XII grossly intact. Skin: Warm and dry. No rashes or lesions. Extremities: No clubbing or cyanosis. No edema. Psychiatric: Mood and affect are normal. Insight and judgment are appropriate.     Data Reviewed: I have personally reviewed following labs and imaging studies  CBC: Recent Labs  Lab 03/03/2017 1106 03/16/17 0347 03/17/17 0342 03/18/17 0353 03/19/17 0405 03/20/17 0419  WBC 35.7* 36.6* 29.2* 24.9* 20.1* 12.9*  NEUTROABS 33.2* 33.7  --   --   --   --   HGB 9.6* 9.2* 8.5* 8.4* 9.3* 8.0*  HCT 29.5* 28.5* 26.5* 26.4* 29.6* 24.9*  MCV 79.5 79.2 80.8 80.5 81.1 79.6  PLT 349 248 221 183 145* 660   Basic Metabolic Panel: Recent Labs  Lab 03/17/17 0342 03/18/17 0353 03/19/17 0405 03/19/17 1713 03/20/17 0419  NA 143 140 142 142 141  K 4.5 4.5 5.2* 5.1 4.6  CL 116* 114* 117* 118* 116*  CO2 19* 18* 16* 17* 17*  GLUCOSE 150* 113* 127* 149* 139*  BUN 65* 64* 67* 77* 68*  CREATININE 2.60* 2.51* 2.63* 2.72* 2.66*  CALCIUM 7.8* 7.9* 8.1* 7.9* 7.8*  MG 2.2 2.2 2.2  --  2.2   GFR: Estimated Creatinine Clearance: 15 mL/min (Anisah Kuck) (by C-G formula based on SCr of 2.66 mg/dL (H)). Liver Function Tests: Recent Labs  Lab 03/16/17 0347 03/17/17 0342 03/18/17 0353 03/19/17 0405 03/19/17 1302 03/20/17 0419  AST 492* 201* 110* 61*  --  38  ALT 590* 428* 312* 227*  --  156*  ALKPHOS 98 94 99 93  --  84  BILITOT 0.9 0.9 0.8 0.9  --  0.7  PROT 5.9* 5.8* 5.8* 5.7* 5.9*  5.5*  ALBUMIN 2.0* 1.9* 1.8* 1.9*  --  1.8*   Recent Labs  Lab 03/13/2017 1106  LIPASE 48   No results for input(s): AMMONIA in the last 168 hours. Coagulation Profile: Recent Labs  Lab 03/14/2017 1106 03/18/17 0353  INR 1.38 1.26   Cardiac Enzymes: No results for input(s): CKTOTAL, CKMB, CKMBINDEX, TROPONINI in the last 168 hours. BNP (last 3 results) No results for input(s): PROBNP in the last 8760 hours. HbA1C: No results for input(s): HGBA1C in the last 72 hours. CBG: No results for input(s): GLUCAP in the last 168 hours. Lipid Profile: No results for input(s): CHOL, HDL, LDLCALC, TRIG, CHOLHDL, LDLDIRECT in the last 72 hours. Thyroid Function Tests: No results for input(s): TSH, T4TOTAL, FREET4, T3FREE, THYROIDAB in the last 72 hours. Anemia Panel: No results for input(s): VITAMINB12, FOLATE, FERRITIN, TIBC, IRON, RETICCTPCT in the last 72 hours. Sepsis Labs: Recent Labs  Lab 02/23/2017 1112 03/11/2017 1303 03/07/2017 1939  PROCALCITON  --   --  18.11  LATICACIDVEN 1.88 1.69  --     Recent Results (from the past 240 hour(s))  Culture, blood (routine x 2)     Status: None (Preliminary result)   Collection Time: 02/18/2017 11:05 AM  Result Value Ref Range Status   Specimen Description   Final    BLOOD RIGHT HAND Performed at [facility], [place] [address], [place], [place] [address]    Special Requests   Final    BOTTLES DRAWN AEROBIC AND ANAEROBIC Blood Culture adequate volume Performed at [place] [address] [place], [place] [address]    Culture   Final    NO GROWTH 4 DAYS Performed at [facility], [place] [address] [place], [place] [address]    Report Status PENDING  Incomplete  Culture, blood (routine x 2)     Status: None (Preliminary result)   Collection Time: 03/13/2017 11:06 AM  Result Value Ref Range Status   Specimen Description   Final    BLOOD RIGHT ANTECUBITAL Performed at [place] [address] [place], [place] [address]    Special Requests   Final    IN PEDIATRIC BOTTLE Blood Culture adequate volume Performed at [place] [address] [place], [place] [address]    Culture   Final    NO GROWTH 4 DAYS Performed at [facility], [place] [address], [place], [place] [address]    Report Status PENDING  Incomplete  MRSA PCR Screening     Status: None   Collection Time: 03/16/17 10:00 AM  Result Value Ref Range Status   MRSA by PCR NEGATIVE NEGATIVE Final    Comment:        The GeneXpert MRSA Assay (FDA approved for NASAL specimens only), is one component of Kei Mcelhiney comprehensive MRSA colonization surveillance program. It is not intended to diagnose MRSA infection nor to guide or monitor treatment for MRSA infections. Performed at [facility], [place] [address] [place], [place] [address]   C difficile quick scan w PCR reflex     Status: Abnormal   Collection Time: 03/18/17 10:57 AM  Result Value Ref Range Status   C Diff antigen POSITIVE (Pedro Oldenburg) NEGATIVE Final   C Diff toxin NEGATIVE NEGATIVE Final   C  Diff interpretation Results are indeterminate. See PCR results.  Final    Comment: Performed at [facility], [place] [address] [place], [place] [address]  Gastrointestinal Panel by PCR , Stool     Status: Abnormal   Collection Time: 03/18/17 10:57 AM  Result Value Ref Range Status   Campylobacter species NOT DETECTED NOT DETECTED Final   Plesimonas shigelloides NOT DETECTED NOT DETECTED Final   Salmonella species NOT DETECTED NOT DETECTED Final   Yersinia enterocolitica NOT DETECTED NOT DETECTED Final   Vibrio species NOT DETECTED NOT DETECTED Final   Vibrio cholerae NOT DETECTED NOT DETECTED Final   Enteroaggregative E coli (EAEC) NOT DETECTED NOT DETECTED Final   Enteropathogenic E coli (EPEC) NOT DETECTED NOT DETECTED Final   Enterotoxigenic E coli (ETEC) NOT DETECTED NOT DETECTED Final     Shiga like toxin producing E coli (STEC) NOT DETECTED NOT DETECTED Final   Shigella/Enteroinvasive E coli (EIEC) NOT DETECTED NOT DETECTED Final   Cryptosporidium NOT DETECTED NOT DETECTED Final   Cyclospora cayetanensis NOT DETECTED NOT DETECTED Final   Entamoeba histolytica NOT DETECTED NOT DETECTED Final   Giardia lamblia NOT DETECTED NOT DETECTED Final   Adenovirus F40/41 NOT DETECTED NOT DETECTED Final   Astrovirus NOT DETECTED NOT DETECTED Final   Norovirus GI/GII DETECTED (Sianne Tejada) NOT DETECTED Final    Comment: RESULT CALLED TO, READ BACK BY AND VERIFIED WITH: CINDY MILLAR AT 6045 ON 03/19/17 BY SNJ    Rotavirus Prabhjot Piscitello NOT DETECTED NOT DETECTED Final   Sapovirus (I, II, IV, and V) NOT DETECTED NOT DETECTED Final    Comment: Performed at [facility], [place] [place], [place] [address]  C. Diff by PCR, Reflexed     Status: Abnormal   Collection Time: 03/18/17 10:57 AM  Result Value Ref Range Status   Toxigenic C. Difficile by PCR POSITIVE (Roza Creamer) NEGATIVE Final    Comment: Positive for toxigenic C. difficile with little to no toxin production. Only treat if clinical presentation suggests symptomatic illness. Performed at [facility], [place] [address], [place], [place] [address]   Body fluid culture     Status: None (Preliminary result)   Collection Time: 03/19/17 11:48 AM  Result Value Ref Range Status   Specimen Description   Final    PLEURAL RIGHT Performed at [place] [address] [place], [place] [address]    Special Requests   Final    NONE Performed at [facility], [place] [address] [place], [place] [address]    Gram Stain   Final    ABUNDANT WBC PRESENT, PREDOMINANTLY PMN ABUNDANT GRAM NEGATIVE RODS    Culture   Final    NO GROWTH < 24 HOURS Performed at [facility], [place] [address] [place],  [address]    Report Status PENDING  Incomplete         Radiology Studies: Dg Chest Port 1  View  Result Date: 03/19/2017 CLINICAL DATA:  Empyema.  Right chest tube. EXAM: PORTABLE CHEST 1 VIEW COMPARISON:  Chest x-ray dated 03/01/2017 and CT scan dated 03/07/2017 FINDINGS: Pigtail drainage catheter is present at the lateral aspect of the right lung base. Small right Pyopneumothorax. Marked decrease in the pleural fluid on the right since the prior CT scan. New atelectasis at the left lung base posterior medially. Heart size and vascularity are normal. Aortic atherosclerosis. IMPRESSION: 1. Decreased right empyema since the prior CT scan. Drainage tube appears in good  position. Small right pyopneumothorax. 2. New atelectasis at the left lung base posterior medially. Electronically Signed   By: Lorriane Shire M.D.   On: 03/19/2017 09:40   Ct Image Guided Fluid Drain By Catheter  Result Date: 03/18/2017 INDICATION: History of right-sided empyema. Please perform CT-guided chest tube placement for infection source control purposes. EXAM: CT IMAGE GUIDED FLUID DRAIN BY CATHETER COMPARISON:  Chest CT - 03/11/2017 MEDICATIONS: The patient is currently admitted to the hospital and receiving intravenous antibiotics. The antibiotics were administered within an appropriate time frame prior to the initiation of the procedure. Narcan 1 mg IV ANESTHESIA/SEDATION: Moderate (conscious) sedation was employed during this procedure. Laverne Klugh total of Versed 1 mg and Fentanyl 50 mcg was administered intravenously. Moderate Sedation Time: 20 minutes. The patient's level of consciousness and vital signs were monitored continuously by radiology nursing throughout the procedure under my direct supervision. CONTRAST:  None COMPLICATIONS: SIR LEVEL B - Normal therapy, includes overnight admission for observation. Procedure complicated by over sedation requiring administration of Narcan 1 mg IV with prompt improved responsiveness and oxygen saturation. PROCEDURE: Informed written consent was obtained from the patient's family after Jamiria Langill  discussion of the risks, benefits and alternatives to treatment. The patient was placed supine, slightly LPO on the CT gantry and Jasie Meleski pre procedural CT was performed re-demonstrating the known loculated right-sided hydropneumothorax. The procedure was planned. Yanira Tolsma timeout was performed prior to the initiation of the procedure. The skin overlying the anterior inferior aspect the right chest was prepped and draped in the usual sterile fashion. The overlying soft tissues were anesthetized with 1% lidocaine with epinephrine. Appropriate trajectory was planned with the use of Angelly Spearing 22 gauge spinal needle. An 18 gauge trocar needle was advanced into the right pleural space and Cambree Hendrix short Amplatz super stiff wire was coiled within the right pleural space. Appropriate positioning was confirmed with Kevis Qu limited CT scan. The tract was serially dilated allowing placement of Jeffie Widdowson 12 [place] all-purpose drainage catheter. Appropriate positioning was confirmed with Caeleigh Prohaska limited postprocedural CT scan. Approximately 1 L of purulent, foul smelling fluid was aspirated. The tube was connected to Garritt Molyneux pleural vac device and sutured in place. Willem Klingensmith dressing was placed. The patient tolerated the procedure well without immediate post procedural complication. IMPRESSION: 1. Successful CT guided placement of Wendelin Reader 12 [place] all purpose drain catheter into the right pleural space with aspiration of approximately 1 L of purulent, foul smelling fluid. Samples were sent to the laboratory as requested by the ordering clinical team. 2. Procedure complicated by over sedation requiring administration of Narcan 1 mg IV with prompt improved responsiveness and oxygen saturation. Electronically Signed   By: Sandi Mariscal M.D.   On: 03/18/2017 17:30        Scheduled Meds: . atenolol  25 mg Oral Daily  . mirtazapine  7.5 mg Oral QHS  . multivitamin  1 tablet Oral Daily  . multivitamin with minerals  1 tablet Oral Daily  . saccharomyces boulardii  250 mg Oral BID  .  thiamine  100 mg Oral Daily  . vancomycin  125 mg Oral QID   Continuous Infusions: . ceFEPime (MAXIPIME) IV Stopped (03/19/17 1417)  . linezolid (ZYVOX) IV Stopped (03/20/17 0000)  . metronidazole 500 mg (03/20/17 0522)  .  sodium bicarbonate  infusion 1000 mL 75 mL/hr at 03/19/17 1345     LOS: 5 days    Time spent: over 30 min    Fayrene Helper, MD [facility] Pager [phone]  If 7PM-7AM,  please contact night-coverage [email or web address] Password [email or web address] 03/20/2017, 10:06 AM

## 2017-03-20 NOTE — Consult Note (Signed)
Consultation Note Date: 03/20/2017   Patient Name: Alexander Barnett  DOB: Oct 17, 1920  MRN: [id number]  Age / Sex: 84 y.o., male  PCP: Jonathon Jordan, MD Referring Physician: Elodia Florence., *  Reason for Consultation: Establishing goals of care  HPI/Patient Profile: 84 y.o. male  admitted on 02/20/2017    Clinical Assessment and Goals of Care:  84 year old gentleman who was living by himself up until the last few months. Patient is from [place], [place]. The patient was admitted with community-acquired pneumonia last month. Prior to that, the patient had had a fall. Patient went to rehabilitation facility after past hospitalization. He suffered from Clostridium difficile colitis. He developed pneumonia again and has been hospitalized since 2-20 8-19. He has been admitted for loculated right-sided pleural effusion, hydrocodone pneumothorax, empyema. Patient was also diagnosed with sepsis, grade 2 diastolic dysfunction, also has history of aortic stenosis. His serum albumin is 1.8 g/dL. Patient was seen and evaluated by pulmonary/critical care medicine. He underwent chest tube placement. He is on broad-spectrum antibiotics. Palliative medicine consultation for goals of care discussions.  I met with the patient and his daughter Melton Alar who is an Therapist, sports by the patient's bedside. I introduced myself and palliative care as follows: Palliative medicine is specialized medical care for people living with serious illness. It focuses on providing relief from the symptoms and stress of a serious illness. The goal is to improve quality of life for both the patient and the family.  We discussed about the patient's current condition, goals, wishes and values discussed in detail.   Introduced hospice philosophy of care, we discussed in detail about the type of care that can be provided in a residential hospice setting.     After extensive conversations, the patient's daughter does not wish to proceed with residential hospice, she wishes to continue current treatments, she is in favor of physical therapy, possibly for the patient to go to another SNF rehab attempt. Eventually, she would like for the patient to go to [facility] assisted living with Hospice.   See additional discussions/ recommendations below:    HCPOA  [name] who is an Therapist, sports is Psychiatrist [phone].  The patient has 4 children, they all live out of town, the patient's wife died 65 years ago.   SUMMARY OF RECOMMENDATIONS   PT consult Continue current mode of care, monitor PO intake, PT input to further assess patient's overall condition.  Probably SNF rehab ( not [facility]) versus [facility] assisted living with Hospice on discharge.  Detailed information given to patient's daughter about the type of care that can be provided in a residential hospice setting. She wishes to continue antibiotics, chest tube and PT efforts at this time.  PMT to follow, thank you for the consult.   Code Status/Advance Care Planning:  DNR    Symptom Management:    continue current mode of care.   Palliative Prophylaxis:   Aspiration   Psycho-social/Spiritual:   Desire for further Chaplaincy support:yes  Additional  Recommendations: Caregiving  Support/Resources  Prognosis:   Guarded   Discharge Planning: To Be Determined      Primary Diagnoses: Present on Admission: . CKD (chronic kidney disease) stage 3, GFR 30-59 ml/min (HCC) . Diastolic dysfunction . MDS (myelodysplastic syndrome), low grade ([place]) . Persistent atrial fibrillation ([place]) . Sepsis ([place]) . C. difficile colitis . HCAP (healthcare-associated pneumonia) . SOB (shortness of breath) . Pulmonary HTN ([place]) . Hepatitis . FTT (failure to thrive) in adult   I have reviewed the medical record, interviewed the patient and family, and examined the  patient. The following aspects are pertinent.  Past Medical History:  Diagnosis Date  . Anemia   . Aortic stenosis    moderate by echo 12/2015  . Arthritis    Fingers  . BPH (benign prostatic hyperplasia)   . Bradycardia   . CKD (chronic kidney disease) stage 3, GFR 30-59 ml/min (HCC)   . Diabetes mellitus without complication ([place])    prediabetes diet controlled  . Diastolic dysfunction   . Diverticulosis of colon   . Erectile dysfunction   . Hematuria   . History of kidney cancer   . History of skin cancer   . Hx of transfusion   . Hypertension   . Persistent atrial fibrillation ([place]) 06/2006   refused anticoagulation  . Prostate cancer ([place])   . Pulmonary HTN ([place]) 12/23/2015   Moderate with PASP 68mHg  . Transitional cell carcinoma of bladder (HCC)    and renal pelvis   Social History   Socioeconomic History  . Marital status: Widowed    Spouse name: None  . Number of children: None  . Years of education: None  . Highest education level: None  Social Needs  . Financial resource strain: None  . Food insecurity - worry: None  . Food insecurity - inability: None  . Transportation needs - medical: None  . Transportation needs - non-medical: None  Occupational History  . None  Tobacco Use  . Smoking status: Former Smoker    Packs/day: 1.50    Years: 20.00    Pack years: 30.00    Types: Cigarettes    Last attempt to quit: 06/21/1962    Years since quitting: 54.7  . Smokeless tobacco: Never Used  Substance and Sexual Activity  . Alcohol use: Yes    Alcohol/week: 2.4 oz    Types: 4 Glasses of wine per week    Comment: glass wine  occassionally  . Drug use: No  . Sexual activity: No  Other Topics Concern  . None  Social History Narrative  . None   History reviewed. No pertinent family history. Scheduled Meds: . mirtazapine  7.5 mg Oral QHS  . multivitamin  1 tablet Oral Daily  . multivitamin with minerals  1 tablet Oral Daily  . saccharomyces boulardii   250 mg Oral BID  . thiamine  100 mg Oral Daily  . vancomycin  125 mg Oral QID   Continuous Infusions: . ceFEPime (MAXIPIME) IV Stopped (03/20/17 1320)  . linezolid (ZYVOX) IV Stopped (03/20/17 1210)  . metronidazole 500 mg (03/20/17 1324)  .  sodium bicarbonate  infusion 1000 mL 75 mL/hr at 03/19/17 1345   PRN Meds:.acetaminophen Medications Prior to Admission:  Prior to Admission medications   Medication Sig Start Date End Date Taking? Authorizing Provider  atenolol (TENORMIN) 25 MG tablet Take 1 tablet (25 mg total) by mouth daily. Patient needs to keep 03/02/17 appointment for further refills 01/30/17  Yes Turner, TTressia Miners  R, MD  cholecalciferol (VITAMIN D) 1000 UNITS tablet Take 1,000 Units by mouth daily.   Yes [provider]  Coenzyme Q10 (CO Q-10) 100 MG CAPS Take 100 mg by mouth daily.    Yes [provider]  diphenoxylate-atropine (LOMOTIL) 2.5-0.025 MG tablet Take 1 tablet by mouth daily as needed for diarrhea or loose stools. 09/22/16  Yes [provider]  finasteride (PROSCAR) 5 MG tablet Take 5 mg by mouth every morning.    Yes [provider]  losartan (COZAAR) 100 MG tablet Take 1 tablet (100 mg total) by mouth daily. 03/05/17  Yes Turner, Eber Hong, MD  magnesium hydroxide (MILK OF MAGNESIA) 400 MG/5ML suspension Take 30 mLs by mouth daily as needed for mild constipation.   Yes [provider]  mirtazapine (REMERON) 15 MG tablet Take 7.5 mg by mouth at bedtime.   Yes [provider]  Multiple Vitamin (MULTIVITAMIN WITH MINERALS) TABS Take 1 tablet by mouth daily.   Yes [provider]  Multiple Vitamins-Minerals (ICAPS AREDS 2) CAPS Take 1 capsule by mouth daily.   Yes [provider]  saccharomyces boulardii (FLORASTOR) 250 MG capsule Take 250 mg by mouth 2 (two) times daily.   Yes [provider]  travoprost, benzalkonium, (TRAVATAN) 0.004 % ophthalmic solution Place 1 drop into both eyes at bedtime.    Yes [provider]   Allergies  Allergen Reactions  . Amoxicillin Diarrhea    Has patient had a PCN reaction causing immediate rash, facial/tongue/throat swelling, SOB or lightheadedness with hypotension: no Has patient had a PCN reaction causing severe rash involving mucus membranes or skin necrosis: no Has patient had a PCN reaction that required hospitalization: unknown Has patient had a PCN reaction occurring within the last 10 years: no If all of the above answers are "NO", then may proceed with Cephalosporin use.  . Ciprofloxacin Other (See Comments)    Muscle strain in groin   Review of Systems +hard of hearing Denies shortness of breath Able to feed self  Physical Exam Elderly gentleman No distress Attempting to eat lunch Clear S1 S2 Abdomen soft No edema Awake, is able to answer very few yes/no type questions appropriately.  Vital Signs: BP 127/83 (BP Location: Right Arm)   Pulse 77   Temp 98 F (36.7 C) (Oral)   Resp 16   Wt 62.4 kg (137 lb 9.1 oz)   SpO2 98%   BMI 22.20 kg/m  Pain Assessment: 0-10 POSS *See Group Information*: 1-Acceptable,Awake and alert Pain Score: 0-No pain   SpO2: SpO2: 98 % O2 Device:SpO2: 98 % O2 Flow Rate: .O2 Flow Rate (L/min): 3 L/min  IO: Intake/output summary:   Intake/Output Summary (Last 24 hours) at 03/20/2017 1442 Last data filed at 03/20/2017 1245 Gross per 24 hour  Intake 2030 ml  Output 380 ml  Net 1650 ml    LBM: Last BM Date: 03/19/17 Baseline Weight: Weight: 62.3 kg (137 lb 5.6 oz) Most recent weight: Weight: 62.4 kg (137 lb 9.1 oz)     Palliative Assessment/Data:     Time In:  1400 Time Out:  1500 Time Total:  60 min  Greater than 50%  of this time was spent counseling and coordinating care related to the above assessment and plan.  Signed by: Loistine Chance, MD  [phone]  Please contact Palliative Medicine Team phone at [phone] for questions and concerns.  For individual provider: See  Shea Evans

## 2017-03-20 NOTE — Progress Notes (Signed)
Alexander Barnett PCCM Rounding Note   S:   No acute events.  Pleural fluid sent for analysis yesterday Continue to drain 200 cc in 24 hours of purulent fluid  O: Blood pressure 103/67, pulse (!) 45, temperature 98 F (36.7 C), temperature source Oral, resp. rate 16, weight 137 lb 9.1 oz (62.4 kg), SpO2 100 %.  Gen:      No acute distress HEENT:  EOMI, sclera anicteric Neck:     No masses; no thyromegaly Lungs:    Clear to auscultation bilaterally; normal respiratory effort CV:         Regular rate and rhythm; no murmurs Abd:      + bowel sounds; soft, non-tender; no palpable masses, no distension Ext:    No edema; adequate peripheral perfusion Skin:      Warm and dry; no rash Neuro: Somnolent, arousable  Recent Labs  Lab 03/18/17 0353 03/19/17 0405 03/20/17 0419  HGB 8.4* 9.3* 8.0*  HCT 26.4* 29.6* 24.9*  WBC 24.9* 20.1* 12.9*  PLT 183 145* 152   Recent Labs  Lab 03/17/17 0342 03/18/17 0353 03/19/17 0405 03/19/17 1713 03/20/17 0419  NA 143 140 142 142 141  K 4.5 4.5 5.2* 5.1 4.6  CL 116* 114* 117* 118* 116*  CO2 19* 18* 16* 17* 17*  GLUCOSE 150* 113* 127* 149* 139*  BUN 65* 64* 67* 77* 68*  CREATININE 2.60* 2.51* 2.63* 2.72* 2.66*  CALCIUM 7.8* 7.9* 8.1* 7.9* 7.8*  MG 2.2 2.2 2.2  --  2.2    A: Loculated Right Pleural Effusion - hydropneumothorax, empyema Recent CAP  Sepsis secondary to above  Pulmonary HTN Failure to Thrive  Recent C-Difficile Colitis  MDS  Grade II Diastolic Dysfunction  Aortic Stenosis - moderate to severe   P: Continue chest tube drainage Gram-negative rods in pleural fluid.  Awaiting final cultures Chest x-ray tomorrow Continue antibiotics  Discussed with daughter at bedside who wants Alexander Barnett to go to hospice soon with no more aggressive care. We will hold off on repeat CT scan and DNAse, TPA Continue chest tube until drainage decreases.  Anticipate removal of chest tube by the end of the week and 2-3 weeks of PO antibiotic therapy  after that based on final cultures. Please contact social work to arrange hospice.  Marshell Garfinkel MD  Pulmonary and Critical Care Pager [phone] If no answer or after 3pm call: [phone] 03/20/2017, 1:02 PM

## 2017-03-21 ENCOUNTER — Ambulatory Visit: Payer: Medicare Other | Admitting: Hematology

## 2017-03-21 ENCOUNTER — Other Ambulatory Visit: Payer: Medicare Other

## 2017-03-21 ENCOUNTER — Ambulatory Visit: Payer: Medicare Other

## 2017-03-21 ENCOUNTER — Inpatient Hospital Stay (HOSPITAL_COMMUNITY): Payer: Medicare Other

## 2017-03-21 DIAGNOSIS — K921 Melena: Secondary | ICD-10-CM

## 2017-03-21 DIAGNOSIS — D62 Acute posthemorrhagic anemia: Secondary | ICD-10-CM

## 2017-03-21 LAB — CBC
HCT: 24.6 % — ABNORMAL LOW (ref 39.0–52.0)
HEMATOCRIT: 17.3 % — AB (ref 39.0–52.0)
Hemoglobin: 7.9 g/dL — ABNORMAL LOW (ref 13.0–17.0)
Hemoglobin: 5.6 g/dL — CL (ref 13.0–17.0)
MCH: 25.4 pg — ABNORMAL LOW (ref 26.0–34.0)
MCH: 25.8 pg — ABNORMAL LOW (ref 26.0–34.0)
MCHC: 32.1 g/dL (ref 30.0–36.0)
MCHC: 32.4 g/dL (ref 30.0–36.0)
MCV: 79.1 fL (ref 78.0–100.0)
MCV: 79.7 fL (ref 78.0–100.0)
Platelets: 123 10*3/uL — ABNORMAL LOW (ref 150–400)
Platelets: 92 10*3/uL — ABNORMAL LOW (ref 150–400)
RBC: 3.11 MIL/uL — ABNORMAL LOW (ref 4.22–5.81)
RBC: 2.17 MIL/uL — AB (ref 4.22–5.81)
RDW: 21.2 % — AB (ref 11.5–15.5)
RDW: 21.1 % — ABNORMAL HIGH (ref 11.5–15.5)
WBC: 8.7 10*3/uL (ref 4.0–10.5)
WBC: 7.4 10*3/uL (ref 4.0–10.5)

## 2017-03-21 LAB — COMPREHENSIVE METABOLIC PANEL
ALT: 119 U/L — ABNORMAL HIGH (ref 17–63)
ANION GAP: 8 (ref 5–15)
AST: 28 U/L (ref 15–41)
Albumin: 1.7 g/dL — ABNORMAL LOW (ref 3.5–5.0)
Alkaline Phosphatase: 78 U/L (ref 38–126)
BUN: 65 mg/dL — ABNORMAL HIGH (ref 6–20)
CHLORIDE: 117 mmol/L — AB (ref 101–111)
CO2: 19 mmol/L — ABNORMAL LOW (ref 22–32)
Calcium: 7.3 mg/dL — ABNORMAL LOW (ref 8.9–10.3)
Creatinine, Ser: 2.7 mg/dL — ABNORMAL HIGH (ref 0.61–1.24)
GFR calc Af Amer: 22 mL/min — ABNORMAL LOW (ref >60)
GFR, EST NON AFRICAN AMERICAN: 19 mL/min — AB (ref >60)
Glucose, Bld: 103 mg/dL — ABNORMAL HIGH (ref 65–99)
POTASSIUM: 4.1 mmol/L (ref 3.5–5.1)
Sodium: 144 mmol/L (ref 135–145)
Total Bilirubin: 0.8 mg/dL (ref 0.3–1.2)
Total Protein: 5.4 g/dL — ABNORMAL LOW (ref 6.5–8.1)

## 2017-03-21 LAB — PH, BODY FLUID: pH, Body Fluid: 5.4

## 2017-03-21 LAB — URINE CULTURE

## 2017-03-21 LAB — PREPARE RBC (CROSSMATCH)

## 2017-03-21 LAB — MAGNESIUM: MAGNESIUM: 2 mg/dL (ref 1.7–2.4)

## 2017-03-21 MED ORDER — SODIUM CHLORIDE 0.9 % IV SOLN
1.0000 g | INTRAVENOUS | Status: DC
  Administered 2017-03-21: 1 g via INTRAVENOUS
  Filled 2017-03-21: qty 1

## 2017-03-21 MED ORDER — SODIUM CHLORIDE 0.9 % IV SOLN
Freq: Once | INTRAVENOUS | Status: AC
  Administered 2017-03-21: 18:00:00 via INTRAVENOUS

## 2017-03-21 MED ORDER — SODIUM CHLORIDE 0.9% FLUSH: 10.0000 mL | INTRAVENOUS | Status: DC | PRN

## 2017-03-21 MED ORDER — FUROSEMIDE 10 MG/ML IJ SOLN
20.0000 mg | Freq: Once | INTRAMUSCULAR | Status: AC
  Administered 2017-03-21: 20 mg via INTRAVENOUS
  Filled 2017-03-21: qty 2

## 2017-03-22 LAB — BODY FLUID CULTURE

## 2017-03-25 LAB — TYPE AND SCREEN
ABO/RH(D): O POS
Antibody Screen: NEGATIVE
UNIT DIVISION: 0
UNIT DIVISION: 0
Unit division: 0

## 2017-03-25 LAB — BPAM RBC
BLOOD PRODUCT EXPIRATION DATE: 201903312359
Blood Product Expiration Date: 201904012359
Blood Product Expiration Date: 201904012359
ISSUE DATE / TIME: 201903061707
UNIT TYPE AND RH: 5100
UNIT TYPE AND RH: 5100
UNIT TYPE AND RH: 5100

## 2017-03-28 ENCOUNTER — Other Ambulatory Visit: Payer: Self-pay | Admitting: Nurse Practitioner

## 2017-03-28 ENCOUNTER — Ambulatory Visit: Payer: Medicare Other

## 2017-03-28 ENCOUNTER — Other Ambulatory Visit: Payer: Medicare Other

## 2017-03-28 ENCOUNTER — Ambulatory Visit: Payer: Medicare Other | Admitting: Hematology

## 2017-04-04 ENCOUNTER — Ambulatory Visit: Payer: Medicare Other

## 2017-04-11 ENCOUNTER — Ambulatory Visit: Payer: Medicare Other

## 2017-04-16 NOTE — Significant Event (Signed)
Rapid Response Event Note  Overview: Time Called: May 07, 2028 Arrival Time: May 08, 2031 Event Type: Neurologic, Other (Comment)(AMS)  Initial Focused Assessment: 08-May-2031 - Primary RN [facility]) called and asked me to respond due to AMS, low BP and bradycardic.  Upon entering room pt agonal respirations at a rate of 4, unresponsive to sternal rub, SBP in 70s.  HR 90s and irregular, going between SR and A-fib.  Pupils fixed and dilated.  Confirmed DNR status in chart.  Started a 533ml NS bolus for low BP and instructed Heidi to notify the [facility] NP that is on-call, but felt pt was in the last stages of life.  Heidi also advised me that pt had recently (approx. 2 hours ago) finished a unit of blood and when she took the blood down pt was alert with no complaints.  No meds recently given that would cause sedation.   07-May-2044 - noticed HR down to 62, with widening complexes, pt apneic, unable to palpate pulse.  05/07/2049 - Asystole on EKG monitor, Pt remains unresponsive and apneic.  Still unable to palpate carotid pulse.  No breath sounds auscultated for full minute.  No heart sounds auscultated for full minute.  Pupils fixed and dilated.  Confirmed by second registered nurse Ishmael Holter). Patient pronounced dead at 2049/05/07.  Interventions:  500 NS bolus (only 171ml went in)  Plan of Care (if not transferred):  Pt DNR and expired.  Event Summary: Name of Physician Notified: Fransisca Connors at 05/08/2043    at    Outcome: Code status clarified(DNR and expired)  Event End Time: 05-07-49  Elio Forget

## 2017-04-16 NOTE — Progress Notes (Signed)
Pharmacy Antibiotic Note  Alexander Barnett is a 84 y.o. male admitted on 02/19/2017 with pneumonia, Loculated R Pleural Effusion, Hydropneumothorax.  Pharmacy has been consulted for cefepime dosing.  Plan: Day 6 Cefepime, D3 Zyvox/Flagyl 1) SCr trending upward slowly - reduce cefepime from 2g IV q24 to 1g IV q24 2) Note that plts and Hgb trending downward - could be result of Zyvox - monitor closely and recommend alternative abx if counts continue to go down 3) Awaiting culture results - pleural fluid ngtd  Weight: 137 lb 9.1 oz (62.4 kg)  Temp (24hrs), Avg:97.9 F (36.6 C), Min:97.3 F (36.3 C), Max:98.3 F (36.8 C)  Recent Labs  Lab 02/26/2017 1112 03/02/2017 1303  03/17/17 0342 03/18/17 0353 03/19/17 0405 03/19/17 1713 03/20/17 0419 04/01/2017 0245  WBC  --   --    < > 29.2* 24.9* 20.1*  --  12.9* 8.7  CREATININE  --   --    < > 2.60* 2.51* 2.63* 2.72* 2.66* 2.70*  LATICACIDVEN 1.88 1.69  --   --   --   --   --   --   --    < > = values in this interval not displayed.    Estimated Creatinine Clearance: 14.8 mL/min (A) (by C-G formula based on SCr of 2.7 mg/dL (H)).    Allergies  Allergen Reactions  . Amoxicillin Diarrhea    Has patient had a PCN reaction causing immediate rash, facial/tongue/throat swelling, SOB or lightheadedness with hypotension: no Has patient had a PCN reaction causing severe rash involving mucus membranes or skin necrosis: no Has patient had a PCN reaction that required hospitalization: unknown Has patient had a PCN reaction occurring within the last 10 years: no If all of the above answers are "NO", then may proceed with Cephalosporin use.  . Ciprofloxacin Other (See Comments)    Muscle strain in groin    Thank you for allowing pharmacy to be a part of this patient's care.   Adrian Saran, PharmD, BCPS Pager [phone] 04-01-17 10:24 AM

## 2017-04-16 NOTE — Progress Notes (Signed)
[facility] PROGRESS NOTE  Ravindra Baranek ZWC:[id number] DOB: 29-Dec-1920 DOA: 02/25/2017  PCP: Jonathon Jordan, MD  Brief History/Interval Summary: Alexander Barnett a 84 y.o.malewith medical history significant ofrecent [facility] pneumonia hospitalized beginning of February and treated with cefepime and oral Vantin at discharge. Patient went to rehab center. Apparently in the summer he suffered from C. difficile colitis which was treated and resolved. Then he developed pneumonia recently and after he started antibiotics he subsequently was diagnosed again with C. difficile and has been started on oral vancomycin. Apparently he has deteriorated over the last month or so and is gotten to the point where he is bedbound. He was sent here for worsening shortness of breath.   On admission, family was not sure they wanted any invasive procedure, but on the weekend, decided to go forward with chest tube placement which was done on 3/3.  PCCM has been consulted due to the empyema.  Palliative care has now been consulted as well for assistance with goals of care and likely transition to hospice at discharge.  He's on broad spectrum abx, which can be narrowed with culture results.     Reason for Visit: Empyema.  GI bleeding.  Consultants: Pulmonology.  Palliative medicine.  Procedures:  CT-guided right chest drain 3/3  Antibiotics: Oral vancomycin IV Flagyl Linezolid Cefepime IV vancomycin  Subjective/Interval History: Patient denies any abdominal pain nausea or vomiting according to nursing staff he had 2 episodes of bloody bowel movements earlier this morning.  He appears to have had another large one currently.  Patient's daughter is at bedside.    ROS: Patient denies any chest pain or shortness of breath currently.  Objective:  Vital Signs  Vitals:   03/20/17 1245 03/20/17 1420 03/20/17 2105 04/10/2017 0617  BP: 127/83 118/71 (!) 85/56 112/72  Pulse: 77 68 72 67    Resp: 16 14 16 16   Temp: 98 F (36.7 C) 98 F (36.7 C) 98.3 F (36.8 C) (!) 97.3 F (36.3 C)  TempSrc: Oral Oral Oral Axillary  SpO2: 98% 99% 98% 99%  Weight:        Intake/Output Summary (Last 24 hours) at 10-Apr-2017 1318 Last data filed at 04/10/2017 0606 Gross per 24 hour  Intake 972.5 ml  Output 150 ml  Net 822.5 ml   Filed Weights   02/22/2017 1400 03/17/17 0426  Weight: 62.3 kg (137 lb 5.6 oz) 62.4 kg (137 lb 9.1 oz)    General appearance: alert, cooperative, appears stated age and no distress Head: Normocephalic, without obvious abnormality, atraumatic Resp: Diminished air entry at the right base.  Dullness to percussion.  Chest tube noted over the right chest wall.  No wheezing.  No rhonchi.  Normal effort at rest. Cardio: regular rate and rhythm, S1, S2 normal, no murmur, click, rub or gallop GI: soft, non-tender; bowel sounds normal; no masses,  no organomegaly Extremities: extremities normal, atraumatic, no cyanosis or edema Pulses: 2+ and symmetric Neurologic: Awake alert.  Mildly distracted but no obvious focal neurological deficits.  Lab Results:  Data Reviewed: I have personally reviewed following labs and imaging studies  CBC: Recent Labs  Lab 02/28/2017 1106 03/16/17 0347  03/18/17 0353 03/19/17 0405 03/20/17 0419 2017-04-10 0245 04/10/2017 1151  WBC 35.7* 36.6*   < > 24.9* 20.1* 12.9* 8.7 7.4  NEUTROABS 33.2* 33.7  --   --   --   --   --   --   HGB 9.6* 9.2*   < > 8.4* 9.3* 8.0* 7.9*  5.6*  HCT 29.5* 28.5*   < > 26.4* 29.6* 24.9* 24.6* 17.3*  MCV 79.5 79.2   < > 80.5 81.1 79.6 79.1 79.7  PLT 349 248   < > 183 145* 152 123* PENDING   < > = values in this interval not displayed.    Basic Metabolic Panel: Recent Labs  Lab 03/17/17 0342 03/18/17 0353 03/19/17 0405 03/19/17 1713 03/20/17 0419 04/18/17 0245  NA 143 140 142 142 141 144  K 4.5 4.5 5.2* 5.1 4.6 4.1  CL 116* 114* 117* 118* 116* 117*  CO2 19* 18* 16* 17* 17* 19*  GLUCOSE 150* 113* 127*  149* 139* 103*  BUN 65* 64* 67* 77* 68* 65*  CREATININE 2.60* 2.51* 2.63* 2.72* 2.66* 2.70*  CALCIUM 7.8* 7.9* 8.1* 7.9* 7.8* 7.3*  MG 2.2 2.2 2.2  --  2.2 2.0    GFR: Estimated Creatinine Clearance: 14.8 mL/min (A) (by C-G formula based on SCr of 2.7 mg/dL (H)).  Liver Function Tests: Recent Labs  Lab 03/17/17 0342 03/18/17 0353 03/19/17 0405 03/19/17 1302 03/20/17 0419 Apr 18, 2017 0245  AST 201* 110* 61*  --  38 28  ALT 428* 312* 227*  --  156* 119*  ALKPHOS 94 99 93  --  84 78  BILITOT 0.9 0.8 0.9  --  0.7 0.8  PROT 5.8* 5.8* 5.7* 5.9* 5.5* 5.4*  ALBUMIN 1.9* 1.8* 1.9*  --  1.8* 1.7*    Recent Labs  Lab 02/22/2017 1106  LIPASE 48    Coagulation Profile: Recent Labs  Lab 02/28/2017 1106 03/18/17 0353  INR 1.38 1.26     Recent Results (from the past 240 hour(s))  Culture, blood (routine x 2)     Status: None   Collection Time: 03/12/2017 11:05 AM  Result Value Ref Range Status   Specimen Description   Final    BLOOD RIGHT HAND Performed at [facility], [place] [address], [place], [place] [address]    Special Requests   Final    BOTTLES DRAWN AEROBIC AND ANAEROBIC Blood Culture adequate volume Performed at [place] [address] [place], [place] [address]    Culture   Final    NO GROWTH 5 DAYS Performed at [facility], [place] [address], [place], [place] [address]    Report Status 03/20/2017 FINAL  Final  Culture, blood (routine x 2)     Status: None   Collection Time: 03/12/2017 11:06 AM  Result Value Ref Range Status   Specimen Description   Final    BLOOD RIGHT ANTECUBITAL Performed at [place] [address] [place], [place] [address]    Special Requests   Final    IN PEDIATRIC BOTTLE Blood Culture adequate volume Performed at [place] [address], [place], [place] [address]    Culture   Final    NO GROWTH 5 DAYS Performed at [facility], [place] [address], [place], [place] [address]    Report Status 03/20/2017 FINAL  Final  MRSA PCR Screening     Status: None   Collection Time: 03/16/17 10:00 AM  Result Value Ref Range Status   MRSA by PCR NEGATIVE NEGATIVE Final    Comment:        The GeneXpert MRSA Assay (FDA approved for NASAL specimens only), is one component of a comprehensive MRSA colonization surveillance program. It is not intended to diagnose MRSA infection nor to guide or monitor  treatment for MRSA infections. Performed at [facility], [place] [address] [place], [place] [address]   C difficile quick scan w PCR reflex     Status: Abnormal   Collection Time: 03/18/17 10:57 AM  Result Value Ref Range Status   C Diff antigen POSITIVE (A) NEGATIVE Final   C Diff toxin NEGATIVE NEGATIVE Final   C Diff interpretation Results are indeterminate. See PCR results.  Final    Comment: Performed at [facility], [place] [address], [place], [place] [address]  Gastrointestinal Panel by PCR , Stool     Status: Abnormal   Collection Time: 03/18/17 10:57 AM  Result Value Ref Range Status   Campylobacter species NOT DETECTED NOT DETECTED Final   Plesimonas shigelloides NOT DETECTED NOT DETECTED Final   Salmonella species NOT DETECTED NOT DETECTED Final   Yersinia enterocolitica NOT DETECTED NOT DETECTED Final   Vibrio species NOT DETECTED NOT DETECTED Final   Vibrio cholerae NOT DETECTED NOT DETECTED Final   Enteroaggregative E coli (EAEC) NOT DETECTED NOT DETECTED Final   Enteropathogenic E coli (EPEC) NOT DETECTED NOT DETECTED Final   Enterotoxigenic E coli (ETEC) NOT DETECTED NOT DETECTED Final   Shiga like toxin producing E coli (STEC) NOT DETECTED NOT DETECTED Final   Shigella/Enteroinvasive E coli (EIEC) NOT DETECTED NOT DETECTED Final   Cryptosporidium NOT DETECTED NOT DETECTED Final   Cyclospora cayetanensis NOT DETECTED NOT DETECTED Final   Entamoeba histolytica NOT DETECTED NOT DETECTED  Final   Giardia lamblia NOT DETECTED NOT DETECTED Final   Adenovirus F40/41 NOT DETECTED NOT DETECTED Final   Astrovirus NOT DETECTED NOT DETECTED Final   Norovirus GI/GII DETECTED (A) NOT DETECTED Final    Comment: RESULT CALLED TO, READ BACK BY AND VERIFIED WITH: CINDY MILLAR AT 3299 ON 03/19/17 BY SNJ    Rotavirus A NOT DETECTED NOT DETECTED Final   Sapovirus (I, II, IV, and V) NOT DETECTED NOT DETECTED Final    Comment: Performed at [facility], [place] [place], [place] [address]  C. Diff by PCR, Reflexed     Status: Abnormal   Collection Time: 03/18/17 10:57 AM  Result Value Ref Range Status   Toxigenic C. Difficile by PCR POSITIVE (A) NEGATIVE Final    Comment: Positive for toxigenic C. difficile with little to no toxin production. Only treat if clinical presentation suggests symptomatic illness. Performed at [facility], [place] [address], [place], [place] [address]   Body fluid culture     Status: None (Preliminary result)   Collection Time: 03/19/17 11:48 AM  Result Value Ref Range Status   Specimen Description   Final    PLEURAL RIGHT Performed at [place] [address], [place], [place] [address]    Special Requests   Final    NONE Performed at [facility], [place] [address], [place], [place] [address]    Gram Stain   Final    ABUNDANT WBC PRESENT, PREDOMINANTLY PMN ABUNDANT GRAM NEGATIVE RODS    Culture   Final    CULTURE REINCUBATED FOR BETTER GROWTH Performed at [facility], [place] [address], [place], [place] [address]    Report Status PENDING  Incomplete  Culture, Urine     Status: Abnormal   Collection Time: 03/19/17  6:00 PM  Result Value Ref Range Status   Specimen Description   Final    URINE, RANDOM Performed at [facility]  [place], [place] [address]    Special Requests   Final    NONE Performed at [facility], [place] [address] [place], [place] [address]    Culture 80,000 COLONIES/mL YEAST (A)  Final   Report Status 2017-04-02 FINAL  Final      Radiology Studies: Dg Chest Port 1 View  Result Date: Apr 02, 2017 CLINICAL DATA:  Respiratory failure, chest tube EXAM: PORTABLE CHEST 1 VIEW COMPARISON:  03/19/2017 FINDINGS: Right chest tube remains in place with small right lateral pneumothorax, slightly increased in size since prior study. Cardiomegaly. Diffuse right lung and left lower lobe airspace opacities have increased since prior study. Suspect small bilateral effusions. IMPRESSION: Right lateral chest tube remains in place with small right lateral pneumothorax, slightly increased since prior study. Increasing right lung and left lower lobe airspace disease which could reflect edema, atelectasis or infection. Small bilateral effusions. Electronically Signed   By: Rolm Baptise M.D.   On: April 02, 2017 08:34     Medications:  Scheduled: . furosemide  20 mg Intravenous Once  . mirtazapine  7.5 mg Oral QHS  . multivitamin  1 tablet Oral Daily  . multivitamin with minerals  1 tablet Oral Daily  . saccharomyces boulardii  250 mg Oral BID  . thiamine  100 mg Oral Daily  . vancomycin  125 mg Oral QID   Continuous: . sodium chloride    . ceFEPime (MAXIPIME) IV 1 g (2017-04-02 1244)  . linezolid (ZYVOX) IV Stopped (April 02, 2017 0349)  . metronidazole 500 mg (Apr 02, 2017 0520)   HKV:[id number], sodium chloride flush  Assessment/Plan:  Principal Problem:   Empyema of right pleural space (HCC) Active Problems:   Persistent atrial fibrillation (HCC)   CKD (chronic kidney disease) stage 3, GFR 30-59 ml/min (HCC)   Aortic stenosis   Diastolic dysfunction   Pulmonary HTN (HCC)   MDS (myelodysplastic syndrome), low grade (HCC)   Sepsis (HCC)   C. difficile colitis   HCAP (healthcare-associated pneumonia)   SOB (shortness of breath)   Hepatitis   FTT (failure to thrive) in adult    Empyema with sepsis   Patient seen  by pulmonology.  Initially family was not certain about invasive procedures.  Subsequently a CT guided drain tube was placed on 3/4.  Cultures are pending.  Preliminary results showed gram-negative rods.  Pulmonology continues to follow.  Patient remains on broad-spectrum antibiotics with cefepime, linezolid and Flagyl.  Patient also on oral vancomycin due to recent history of C. difficile.  Pro-calcitonin was 18 at presentation.  Lactic acid level was normal.  Intravenous vancomycin was discontinued.   Hematochezia Overnight patient developed hematochezia.  He denies any abdominal pain.  No significant hypotension noted. I did call [facility] gastroenterology. Patient apparently had colonoscopy on May 2018.   Colonoscopy showed internal hemorrhoids and diffuse diverticulosis involving multiple areas of the colon.  Upper endoscopy done in April did not show any significant abnormalities.  Considering painless bleeding this is most likely diverticular.  Recent CT scan did raise concern for colitis.  Recent INR was normal.  Discussed in detail with patient's daughter.  I told her that this will hopefully subside on its own.  He remains hemodynamically stable.  He will likely need blood transfusions.  If this does not subside then we may have to involve gastroenterology and consider a bleeding scan but daughter is not certain if they want to go down that route.  Acute blood loss anemia Hemoglobin has dropped significantly.  Most likely due  to GI bleed.  Transfuse 2 units of blood to begin with.  Acute respiratory failure with hypoxia Most likely secondary to empyema.  Wean down oxygen as tolerated.  C. difficile colitis Apparently he has had a few episodes of C. difficile infection previously.  Apparently was on vancomycin earlier in February.  Since he is requiring broad-spectrum antibiotics and due to recent history of C. difficile he is being treated with oral vancomycin.  Was recently on fidaxomicin as well.     Norovirus GI pathogen panel positive for neurovirus.  Has not had watery diarrhea recently.  Continue contact precautions.  Acute metabolic encephalopathy Patient was noted to be delirious after his procedure on 3/3.  Seems to be back to baseline currently.  Goals of care Palliative medicine is following.  She is DNR.  Discussions regarding hospice continue.  Mildly elevated LFTs Etiology unclear.  Normal-appearing liver and gallbladder on imaging studies.  He is asymptomatic.  Hepatitis panel was negative.  Failure to thrive Most likely due to acute illness.  PT and OT when more stable.  History of atrial fibrillation He was on atenolol which was stopped due to bradycardia.  Continue to monitor for now.  AKI on CKD (chronic kidney disease) stage 3 with normal anion gap metabolic acidosis Baseline creatinine looks to be 1-1.4.  Renal failure most likely due to volume depletion and sepsis.  Creatinine remains elevated.  CT did not show any evidence for hydronephrosis.  Continue IV fluids with bicarbonate.  Monitor urine output.  Avoid nephrotoxic agents.   Leukocytosis Improved.  Continue to monitor.    Pyuria Urine culture positive for 80,000 colonies of yeast.  Hyperkalemia Appears to have resolved.  Chronic diastolic Dysfunction  Aortic stenosis  Pulmonary Hypertension Grade 2 diastolic dysfunction on echo 1/25 as well as moderate to severe AS.  Follow outpatient.    DVT Prophylaxis: SCDs    Code Status: DNR Family Communication: Discussed with the patient and his daughter Disposition Plan: Management as outlined above.  Blood transfusion.    LOS: 6 days   [facility] Pager [phone] 2017/03/25, 1:18 PM  If 7PM-7AM, please contact night-coverage at [email or web address], password [facility]

## 2017-04-16 NOTE — Progress Notes (Signed)
CSW following for assistance with disposition. Today, discussed plans with pt's daughter again. She states she has had more conversations with palliative team and pulmonology, feel that comfort care is primary goal at this time. "This is so hard but we know there is no long term improvement for him. We get one thing fixed and then something else goes wrong. His body is just breaking down. We want him to have hospice to keep him comfortable." CSW discussed with palliative as well, feel residential hospice most appropriate care plan. Daughter in agreement, discussed options and made referral to [facility]. Noted pt to have transfusion today and also pending plan with chest tube. Will continue following to assist.   Sharren Bridge, MSW, LCSW Clinical Social Work 2017-04-20 [phone]

## 2017-04-16 NOTE — Progress Notes (Signed)
Daily Progress Note   Patient Name: Alexander Barnett       Date: March 30, 2017 DOB: 1920-09-07  Age: 84 y.o. MRN#: [id number] Attending Physician: Bonnielee Haff, MD Primary Care Physician: Jonathon Jordan, MD Admit Date: 03/07/2017  Reason for Consultation/Follow-up: Establishing goals of care  Subjective:  Patient is resting in bed, he appears pale, weak. He opens eyes, but doesn't engage much.  Call placed and discussed with daughter, see below:   Length of Stay: 6  Current Medications: Scheduled Meds:  . furosemide  20 mg Intravenous Once  . mirtazapine  7.5 mg Oral QHS  . multivitamin  1 tablet Oral Daily  . multivitamin with minerals  1 tablet Oral Daily  . saccharomyces boulardii  250 mg Oral BID  . thiamine  100 mg Oral Daily  . vancomycin  125 mg Oral QID    Continuous Infusions: . sodium chloride    . ceFEPime (MAXIPIME) IV 1 g (03/30/2017 1244)  . linezolid (ZYVOX) IV Stopped (03-30-17 0349)  . metronidazole 500 mg (2017/03/30 0520)    PRN Meds: acetaminophen, sodium chloride flush  Physical Exam         Awake but not alert Does not appear to be in distress Has coarse breath sounds anteriorly both lung fields S1 S2 Abdomen soft Thin elderly gentleman  Senile purpuric spots on both UE No edema  Vital Signs: BP 112/72 (BP Location: Left Arm)   Pulse 67   Temp (!) 97.3 F (36.3 C) (Axillary)   Resp 16   Wt 62.4 kg (137 lb 9.1 oz)   SpO2 99%   BMI 22.20 kg/m  SpO2: SpO2: 99 % O2 Device: O2 Device: Nasal Cannula O2 Flow Rate: O2 Flow Rate (L/min): 3 L/min  Intake/output summary:   Intake/Output Summary (Last 24 hours) at Mar 30, 2017 1353 Last data filed at 30-Mar-2017 0606 Gross per 24 hour  Intake 972.5 ml  Output 150 ml  Net 822.5 ml   LBM: Last BM Date:  03/20/17 Baseline Weight: Weight: 62.3 kg (137 lb 5.6 oz) Most recent weight: Weight: 62.4 kg (137 lb 9.1 oz)       Palliative Assessment/Data: PPS 30% Eating about 25% of his meals at best.      Patient Active Problem List   Diagnosis Date Noted  . Sepsis ([place]) 02/19/2017  .  C. difficile colitis 02/27/2017  . HCAP (healthcare-associated pneumonia) 03/14/2017  . SOB (shortness of breath) 03/02/2017  . Hepatitis 03/02/2017  . FTT (failure to thrive) in adult 02/24/2017  . Empyema of right pleural space ([place]) 03/13/2017  . Malnutrition of moderate degree 02/09/2017  . RSV (respiratory syncytial virus pneumonia) 02/09/2017  . Acute hypoxemic respiratory failure ([place]) 02/09/2017  . DM type 2 (diabetes mellitus, type 2) ([place]) 02/09/2017  . Aortic atherosclerosis ([place]) 02/09/2017  . CAP (community acquired pneumonia) 02/08/2017  . MDS (myelodysplastic syndrome), low grade ([place]) 09/27/2016  . CKD (chronic kidney disease) 09/27/2016  . Pulmonary HTN ([place]) 12/23/2015  . Hematuria, gross 03/26/2014  . Bradycardia   . CKD (chronic kidney disease) stage 3, GFR 30-59 ml/min (HCC)   . Aortic stenosis   . Diastolic dysfunction   . Hypertension   . Arthritis   . History of skin cancer   . Hematuria   . Anemia   . Hx of transfusion   . History of kidney cancer   . Persistent atrial fibrillation ([place]) 06/17/2006    Palliative Care Assessment & Plan   Patient Profile:    Assessment:  GI Bleed Recent PNA Recent C Diff Generalized deconditioning Recent loculated pleural effusion,s/p chest tube, PCCM following.  +norovirus.   Recommendations/Plan:   call placed and discussed in detail with daughter Melton Alar, she is concerned that the patient continues on downhill trajectory, she is concerned that the patient continues to have one complication after another. In particular, she doesn't believe that the patient would want invasive work up or procedures, the patient apparently told  her in the evening on 03-20-17,"I can't live like this."  Daughter believes patient is getting tired, has high risk for ongoing decline despite best measures and interventions. She states that her and her siblings are seriously considering residential hospice as they would like to focus on keeping the patient as comfortable as possible, she plans on directly discussing this with the patient this afternoon.   We discussed about continuing with PRBC transfusion as his Hgb is around 5 gm/dL as a symptom management measure to avoid chest pain/dyspnea, to avoid symptomatic anemia, at present, we agree that the patient appears comfortable.   CSW to assist [place] with residential hospice arrangements, choices discussed, daughter prefers [place], [place] and would like to meet with their liaisons in am on 03-22-17.   PMT to follow along.    Code Status:    Code Status Orders  (From admission, onward)        Start     Ordered   02/18/2017 1427  Do not attempt resuscitation (DNR)  Continuous    Question Answer Comment  In the event of cardiac or respiratory ARREST Do not call a "code blue"   In the event of cardiac or respiratory ARREST Do not perform Intubation, CPR, defibrillation or ACLS   In the event of cardiac or respiratory ARREST Use medication by any route, position, wound care, and other measures to relive pain and suffering. May use oxygen, suction and manual treatment of airway obstruction as needed for comfort.      03/08/2017 1427    Code Status History    Date Active Date Inactive Code Status Order ID Comments User Context   02/15/2017 12:59 02/17/2017 18:49 DNR [id number]  Reyne Dumas, MD Inpatient   02/15/2017 08:53 02/15/2017 12:59 Full Code [id number]  Reyne Dumas, MD ED   02/08/2017 20:52 02/12/2017 18:11 Full Code [id number]  Jani Gravel, MD Inpatient   03/26/2014 14:39 04/01/2014 15:08 Full Code [id number]  Franchot Gallo, MD Inpatient    Advance Directive  Documentation     Most Recent Value  Type of Advance Directive  Healthcare Power of [place] will [pt states he is a dnr]  Pre-existing out of facility DNR order (yellow form or pink MOST form)  No data  "MOST" Form in Place?  No data       Prognosis:   < 2 weeks  Discharge Planning:  Hospice facility  Care plan was discussed with  Patient's daughter Vella Kohler agent, CSW Meghan and [facility] MD Dr [place] Pink.   Thank you for allowing the Palliative Medicine Team to assist in the care of this patient.   Time In: 1300 Time Out: 1335 Total Time 35 Prolonged Time Billed  no       Greater than 50%  of this time was spent counseling and coordinating care related to the above assessment and plan.  Loistine Chance, MD [phone]  Please contact Palliative Medicine Team phone at [phone] for questions and concerns.

## 2017-04-16 NOTE — Progress Notes (Addendum)
[facility] Rounding Note   S:  Daughter at bedside.  Concerned regarding discharge plans as she has to go back home today.  Reports her brother is coming up from [facility] and would prefer he be not be transferred until he gets here.  2 episodes of dark stools this am. H/H pending.   O: Blood pressure 112/72, pulse 67, temperature (!) 97.3 F (36.3 C), temperature source Axillary, resp. rate 16, weight 137 lb 9.1 oz (62.4 kg), SpO2 99 %.  General: frail elderly male in NAD, sitting up in bed HEENT: MM pink/moist, no jvd PSY: calm/appropriate  Neuro: Awake, alert, MAE, speech clear CV: s1s2 rrr, no m/r/g PULM: even/non-labored, diminished on R, right chest tube draining yellow/brown fluid.  Small air leak noted.  Chest tube dressing changed with occlusive dressing re-applied.  Site clean/dry KW:IOXB, non-tender, bsx4 active  Extremities: warm/dry, trace generalized edema  Skin: no rashes or lesions  Recent Labs  Lab 03/19/17 0405 03/20/17 0419 08-Apr-2017 0245  HGB 9.3* 8.0* 7.9*  HCT 29.6* 24.9* 24.6*  WBC 20.1* 12.9* 8.7  PLT 145* 152 123*   Recent Labs  Lab 03/17/17 0342 03/18/17 0353 03/19/17 0405 03/19/17 1713 03/20/17 0419 04/08/2017 0245  NA 143 140 142 142 141 144  K 4.5 4.5 5.2* 5.1 4.6 4.1  CL 116* 114* 117* 118* 116* 117*  CO2 19* 18* 16* 17* 17* 19*  GLUCOSE 150* 113* 127* 149* 139* 103*  BUN 65* 64* 67* 77* 68* 65*  CREATININE 2.60* 2.51* 2.63* 2.72* 2.66* 2.70*  CALCIUM 7.8* 7.9* 8.1* 7.9* 7.8* 7.3*  MG 2.2 2.2 2.2  --  2.2 2.0    A: Loculated Right Pleural Effusion / Empyema Pneumothorax    Recent CAP  Sepsis secondary to above  Pulmonary HTN Failure to Thrive  Recent C-Difficile Colitis Norovirus   MDS  Grade II Diastolic Dysfunction  Aortic Stenosis - moderate to severe   P: Continue chest tube drainage  Place chest tube to suction, 20 cm given PTX Follow repeat CXR in am  ABX > cefepime, zyvox x1, flagyl  Plan to continue abx for 21 days total  (including inpatient abx) Oral vanco per primary for recent C-Diff  Follow pleural cultures  Daughter does not want to pursue tPA, pulmozyme  Once chest tube drainage slowed to 100 or less per day, will consider removing chest tube.  229ml on 3/6, ~ 150 out thus far this am.    Family - daughter Rubin Payor, who is an Therapist, sports) updated at bedside 3/6 on plan of care.  I gave her my cell number in the event she has questions / needs.    Noe Gens, NP-C [facility] Pgr: [phone] or if no answer [phone] 04-08-17, 10:37 AM

## 2017-04-16 NOTE — Progress Notes (Signed)
PT Cancellation Note  Patient Details Name: Alexander Barnett MRN: [id number] DOB: [birth year]   Cancelled Treatment:    Reason Eval/Treat Not Completed: Medical issues which prohibited therapy. Spoke with Dr. [place] Pink who stated to hold off on PT at this time.  Will check back tomorrow.   [name] 2017-03-23, 10:18 AM

## 2017-04-16 NOTE — Progress Notes (Signed)
Pt has had 4 episodes of soft, runny stool and the last two were bloody. The nurse paged the on-call provider.

## 2017-04-16 NOTE — Progress Notes (Signed)
[facility] Liaison:  RN  Received request from [place], [place], for family interest in [facility].  Chart reviewed and spoke with Melton Alar, daughter, to acknowledge referral.  Unfortunately, [facility] is not able to offer a room today.  Melton Alar and CSW are aware [facility] liaison will follow up with Meghan and family tomorrow or sooner if room becomes available.  Please do not hesitate to call with questions.    Thank you for this referral.  Edyth Gunnels, RN, [facility] Liaison [phone]  All hospital liaisons are now on [place].

## 2017-04-16 NOTE — Progress Notes (Signed)
Referring Physician(s): Powell,A,Jr  Supervising Physician: Arne Cleveland  Patient Status:  [facility] - In-pt  Chief Complaint: Right loculated pleural effusion/hydropneumothorax   Subjective: Pt appears pale, weak; denies CP, worsening dyspnea or sig cough; began having bloody BM's this am per nurse; to receive transfusion; hgb 5.6   Allergies: Amoxicillin and Ciprofloxacin  Medications: Prior to Admission medications   Medication Sig Start Date End Date Taking? Authorizing Provider  atenolol (TENORMIN) 25 MG tablet Take 1 tablet (25 mg total) by mouth daily. Patient needs to keep 03/02/17 appointment for further refills 01/30/17  Yes Turner, Eber Hong, MD  cholecalciferol (VITAMIN D) 1000 UNITS tablet Take 1,000 Units by mouth daily.   Yes [provider]  Coenzyme Q10 (CO Q-10) 100 MG CAPS Take 100 mg by mouth daily.    Yes [provider]  diphenoxylate-atropine (LOMOTIL) 2.5-0.025 MG tablet Take 1 tablet by mouth daily as needed for diarrhea or loose stools. 09/22/16  Yes [provider]  finasteride (PROSCAR) 5 MG tablet Take 5 mg by mouth every morning.    Yes [provider]  losartan (COZAAR) 100 MG tablet Take 1 tablet (100 mg total) by mouth daily. 03/05/17  Yes Turner, Eber Hong, MD  magnesium hydroxide (MILK OF MAGNESIA) 400 MG/5ML suspension Take 30 mLs by mouth daily as needed for mild constipation.   Yes [provider]  mirtazapine (REMERON) 15 MG tablet Take 7.5 mg by mouth at bedtime.   Yes [provider]  Multiple Vitamin (MULTIVITAMIN WITH MINERALS) TABS Take 1 tablet by mouth daily.   Yes [provider]  Multiple Vitamins-Minerals (ICAPS AREDS 2) CAPS Take 1 capsule by mouth daily.   Yes [provider]  saccharomyces boulardii (FLORASTOR) 250 MG capsule Take 250 mg by mouth 2 (two) times daily.   Yes [provider]  travoprost, benzalkonium, (TRAVATAN) 0.004 % ophthalmic solution Place  1 drop into both eyes at bedtime.   Yes [provider]     Vital Signs: BP (!) 104/54 (BP Location: Left Arm)   Pulse (!) 54   Temp (!) 97.3 F (36.3 C) (Axillary)   Resp 16   Wt 137 lb 9.1 oz (62.4 kg)   SpO2 99%   BMI 22.20 kg/m   Physical Exam sleepy but arousable,HOH; rt chest drain intact,+ air leak; all connections checked; tube to wall suction ; beige colored fluid in tubing  Imaging: Dg Chest Port 1 View  Result Date: April 16, 2017 CLINICAL DATA:  Respiratory failure, chest tube EXAM: PORTABLE CHEST 1 VIEW COMPARISON:  03/19/2017 FINDINGS: Right chest tube remains in place with small right lateral pneumothorax, slightly increased in size since prior study. Cardiomegaly. Diffuse right lung and left lower lobe airspace opacities have increased since prior study. Suspect small bilateral effusions. IMPRESSION: Right lateral chest tube remains in place with small right lateral pneumothorax, slightly increased since prior study. Increasing right lung and left lower lobe airspace disease which could reflect edema, atelectasis or infection. Small bilateral effusions. Electronically Signed   By: Rolm Baptise M.D.   On: 04-16-2017 08:34   Dg Chest Port 1 View  Result Date: 03/19/2017 CLINICAL DATA:  Empyema.  Right chest tube. EXAM: PORTABLE CHEST 1 VIEW COMPARISON:  Chest x-ray dated 03/13/2017 and CT scan dated 03/07/2017 FINDINGS: Pigtail drainage catheter is present at the lateral aspect of the right lung base. Small right Pyopneumothorax. Marked decrease in the pleural fluid on the right since the prior CT scan. New atelectasis at  the left lung base posterior medially. Heart size and vascularity are normal. Aortic atherosclerosis. IMPRESSION: 1. Decreased right empyema since the prior CT scan. Drainage tube appears in good position. Small right pyopneumothorax. 2. New atelectasis at the left lung base posterior medially. Electronically Signed   By: Lorriane Shire M.D.   On: 03/19/2017  09:40   Ct Image Guided Fluid Drain By Catheter  Result Date: 03/18/2017 INDICATION: History of right-sided empyema. Please perform CT-guided chest tube placement for infection source control purposes. EXAM: CT IMAGE GUIDED FLUID DRAIN BY CATHETER COMPARISON:  Chest CT - 03/13/2017 MEDICATIONS: The patient is currently admitted to the hospital and receiving intravenous antibiotics. The antibiotics were administered within an appropriate time frame prior to the initiation of the procedure. Narcan 1 mg IV ANESTHESIA/SEDATION: Moderate (conscious) sedation was employed during this procedure. A total of Versed 1 mg and Fentanyl 50 mcg was administered intravenously. Moderate Sedation Time: 20 minutes. The patient's level of consciousness and vital signs were monitored continuously by radiology nursing throughout the procedure under my direct supervision. CONTRAST:  None COMPLICATIONS: SIR LEVEL B - Normal therapy, includes overnight admission for observation. Procedure complicated by over sedation requiring administration of Narcan 1 mg IV with prompt improved responsiveness and oxygen saturation. PROCEDURE: Informed written consent was obtained from the patient's family after a discussion of the risks, benefits and alternatives to treatment. The patient was placed supine, slightly LPO on the CT gantry and a pre procedural CT was performed re-demonstrating the known loculated right-sided hydropneumothorax. The procedure was planned. A timeout was performed prior to the initiation of the procedure. The skin overlying the anterior inferior aspect the right chest was prepped and draped in the usual sterile fashion. The overlying soft tissues were anesthetized with 1% lidocaine with epinephrine. Appropriate trajectory was planned with the use of a 22 gauge spinal needle. An 18 gauge trocar needle was advanced into the right pleural space and a short Amplatz super stiff wire was coiled within the right pleural space.  Appropriate positioning was confirmed with a limited CT scan. The tract was serially dilated allowing placement of a 12 [place] all-purpose drainage catheter. Appropriate positioning was confirmed with a limited postprocedural CT scan. Approximately 1 L of purulent, foul smelling fluid was aspirated. The tube was connected to a pleural vac device and sutured in place. A dressing was placed. The patient tolerated the procedure well without immediate post procedural complication. IMPRESSION: 1. Successful CT guided placement of a 12 [place] all purpose drain catheter into the right pleural space with aspiration of approximately 1 L of purulent, foul smelling fluid. Samples were sent to the laboratory as requested by the ordering clinical team. 2. Procedure complicated by over sedation requiring administration of Narcan 1 mg IV with prompt improved responsiveness and oxygen saturation. Electronically Signed   By: Sandi Mariscal M.D.   On: 03/18/2017 17:30    Labs:  CBC: Recent Labs    03/19/17 0405 03/20/17 0419 2017/04/12 0245 04/12/17 1151  WBC 20.1* 12.9* 8.7 7.4  HGB 9.3* 8.0* 7.9* 5.6*  HCT 29.6* 24.9* 24.6* 17.3*  PLT 145* 152 123* 92*    COAGS: Recent Labs    03/07/2017 1106 03/18/17 0353  INR 1.38 1.26  APTT  --  37*    BMP: Recent Labs    03/19/17 0405 03/19/17 1713 03/20/17 0419 04-12-2017 0245  NA 142 142 141 144  K 5.2* 5.1 4.6 4.1  CL 117* 118* 116* 117*  CO2 16* 17* 17*  19*  GLUCOSE 127* 149* 139* 103*  BUN 67* 77* 68* 65*  CALCIUM 8.1* 7.9* 7.8* 7.3*  CREATININE 2.63* 2.72* 2.66* 2.70*  GFRNONAA 19* 19* 19* 19*  GFRAA 22* 21* 22* 22*    LIVER FUNCTION TESTS: Recent Labs    03/18/17 0353 03/19/17 0405 03/19/17 1302 03/20/17 0419 17-Apr-2017 0245  BILITOT 0.8 0.9  --  0.7 0.8  AST 110* 61*  --  38 28  ALT 312* 227*  --  156* 119*  ALKPHOS 99 93  --  84 78  PROT 5.8* 5.7* 5.9* 5.5* 5.4*  ALBUMIN 1.8* 1.9*  --  1.8* 1.7*    Assessment and Plan: Pt with hx  recent PNA, FTT, recent c diff/norovirus,  loculated rt hydroptx/prob empyema; s/p drain placement 3/3; afebrile; urine cx- yeast; pleural fl cx - pend; creat 2.7(2.66), WBC nl; hgb 5.6- for transfusion; CXR- sl increase in size of small rt lat ptx; increasing rt lung/LLL airspace dz; undergoing hospice eval/transition; tube to wall suction; monitor CXR   Electronically Signed: D. Rowe Robert, PA-C Apr 17, 2017, 2:20 PM   I spent a total of 15 minutes at the the patient's bedside AND on the patient's hospital floor or unit, greater than 50% of which was counseling/coordinating care for right chest drain    Patient ID: Alexander Barnett, male   DOB: Jul 30, 1920, 84 y.o.   MRN: [id number]

## 2017-04-16 NOTE — Discharge Summary (Signed)
Death Summary  Kennett Symes YQM:[id number] DOB: 1920/12/11 DOA: April 12, 2017  PCP: Jonathon Jordan, MD  Admit date: 2017/04/12 Date of Death: 2017/04/19  Cause of Death: GI bleed   History of present illness: Montay Vanvoorhis a 84 y.o.malewith medical history significant ofrecent [facility] pneumonia hospitalized beginning of February and treated with cefepime and oral Vantin at discharge. Patient went to rehab center. Apparently in the summer he suffered from C. difficile colitis which was treated and resolved. Then he developed pneumonia recently and after he started antibiotics he subsequently was diagnosed again with C. difficile and has been started on oral vancomycin. Apparently he has deteriorated over the last month or so and is gotten to the point where he is bedbound. He was sent here for worsening shortness of breath.   On admission, family was not sure they wanted any invasive procedure, but on the weekend, decided to go forward with chest tube placement which was done on 3/3.  PCCM was consulted due to the empyema. Palliative medicine was consulted as well for assistance with goals of care and likely transition to hospice at discharge.    Hospital Course:   Empyema with sepsis   Patient seen by pulmonology.  Initially family was not certain about invasive procedures.  Subsequently a CT guided drain tube was placed on 3/4.  Cultures are pending.  Preliminary results showed gram-negative rods.  Patient is followed by pulmonology. Patient remained on broad-spectrum antibiotics with cefepime, linezolid and Flagyl.  Patient was also on oral vancomycin due to recent history of C. difficile.  Pro-calcitonin was 18 at presentation.  Lactic acid level was normal.  Intravenous vancomycin was discontinued.   Hematochezia Overnight on 3/5-3/6 patient developed hematochezia.    He did not have any abdominal pain.  Previous colonoscopy reports as well as CT scan report suggested  diverticulosis.  It was felt that the bleeding problems from a diverticular source.  Patient was hemodynamically stable and asymptomatic.  He was given blood transfusions.  Discussions were held with the patient's family regarding further testing.  They decided to pursue residential hospice placement instead.    Acute blood loss anemia Hemoglobin was checked after his episodes of rectal bleeding.  It was noted to be low.  He was ordered blood transfusion.    Acute respiratory failure with hypoxia Most likely secondary to empyema.    C. difficile colitis Apparently he has had a few episodes of C. difficile infection previously.  Apparently was on vancomycin earlier in February.  Since he was requiring broad-spectrum antibiotics and due to recent history of C. difficile he was treated with oral vancomycin.  Was recently on fidaxomicin as well.    Norovirus GI pathogen panel positive for norovirus.  Has not had watery diarrhea recently.   Acute metabolic encephalopathy Patient was noted to be delirious after his procedure on 3/3.    Mildly elevated LFTs Etiology unclear.  Normal-appearing liver and gallbladder on imaging studies.  Hepatitis panel was negative.  Failure to thrive Most likely due to acute illness.   History of atrial fibrillation He was on atenolol which was stopped due to bradycardia.    AKI on CKD (chronic kidney disease) stage 3 with normal anion gap metabolic acidosis Baseline creatinine 1-1.4.  Renal failure most likely due to volume depletion and sepsis.     Pyuria Urine culture positive for 80,000 colonies of yeast.  Chronic diastolic Dysfunction  Aortic stenosis  Pulmonary Hypertension Grade 2 diastolic dysfunction on echo 1/25 as well  as moderate to severe AS.    Plan was for the patient to be discharged to residential hospice.  However on the night of 3/6 patient was noted to be unresponsive and hypotensive.   He was given a fluid bolus without  improvement.  He became bradycardic and then went into asystole.  Time of death 8:51PM on 04-11-2017.    The results of significant diagnostics from this hospitalization (including imaging, microbiology, ancillary and laboratory) are listed below for reference.    Significant Diagnostic Studies: Ct Abdomen Pelvis Wo Contrast  Result Date: 03/09/2017 CLINICAL DATA:  Worsening shortness of breath, abnormal chest radiograph with question pneumonia, leukocytosis, sepsis, abnormal LFTs, history C difficile colitis, pulmonary hypertension, persistent atrial fibrillation, diabetes mellitus, prostate cancer, low-grade myelodysplastic syndrome, former smoker EXAM: CT CHEST, ABDOMEN AND PELVIS WITHOUT CONTRAST TECHNIQUE: Multidetector CT imaging of the chest, abdomen and pelvis was performed following the standard protocol without IV contrast. Sagittal and coronal MPR images reconstructed from axial data set. No oral contrast was administered. COMPARISON:  CT chest 02/15/2017, CT abdomen and pelvis 03/10/2016, lumbar spine radiographs 02/15/2017 FINDINGS: CT CHEST FINDINGS Cardiovascular: Atherosclerotic calcifications aorta, proximal great vessels and coronary arteries. Aorta normal caliber. Significant aortic valvular calcification. No pericardial effusion. Enlarged central pulmonary arteries consistent with history of pulmonary arterial hypertension. Mediastinum/Nodes: Calcified lymph nodes RIGHT hilum and mediastinum consistent with old granulomatous disease. Esophagus unremarkable. No enlarged thoracic lymph nodes. Base of cervical region unremarkable. Lungs/Pleura: Atelectasis and peribronchial thickening in LEFT lower lobe. No LEFT pleural effusion. Moderate RIGHT pleural effusion which appears at least partially loculated. Foci of gas are seen within the RIGHT hemithorax as well consistent with hydropneumothorax. Calcified granulomata RIGHT lung. Compressive atelectasis of the RIGHT lung particularly RIGHT lower  lobe to lesser degree posterior aspects of the RIGHT upper and RIGHT middle lobes. No additional pulmonary mass. No definite endobronchial lesions. Musculoskeletal: Osseous demineralization. Old RIGHT rib fractures. Chronic appearing height losses of several thoracic vertebra. CT ABDOMEN PELVIS FINDINGS Hepatobiliary: Gallbladder contracted.  Liver unremarkable. Pancreas: Normal appearance Spleen: Atrophic Adrenals/Urinary Tract: Adrenal glands unremarkable. Absent LEFT kidney. Probable small RIGHT renal cysts without gross hydronephrosis or solid mass the ureters and bladder unremarkable. Stomach/Bowel: Normal appendix. Scattered colonic diverticulosis without evidence of diverticulitis. Rectosigmoid wall thickening with stranding of perirectal fat into presacral space. Stomach and bowel loops otherwise normal appearance. Vascular/Lymphatic: Atherosclerotic calcifications without aneurysm. Tortuous iliac arteries with atherosclerotic calcification extending into common femoral arteries. No adenopathy. Reproductive: Minimal prostatic enlargement Other: No free air or free fluid.  No hernia. Musculoskeletal: Osseous demineralization. Superior endplate compression fracture L1 as noted on recent lumbar spine radiographs. IMPRESSION: Moderate RIGHT pleural effusion, suspect loculated, with additional component of loculated hydropneumothorax laterally, cannot exclude empyema in patient with history of pneumonia and sepsis. Compressive atelectasis of RIGHT lung with central chronic bronchitic changes, old granulomatous disease, and mild LEFT basilar atelectasis. Enlargement of cardiac chambers with central pulmonary arterial enlargement consistent with pulmonary hypertension. Scattered colonic diverticulosis. Wall thickening of rectosigmoid colon with perirectal edema compatible with colitis, could represent infection or inflammatory bowel disease. Small RIGHT renal cysts and evidence of prior LEFT nephrectomy. Aortic  Atherosclerosis (ICD10-I70.0). Findings called to Dr. Melina Copa On 02/16/2017 at 1545 hrs. Electronically Signed   By: Lavonia Dana M.D.   On: 02/21/2017 15:45   Dg Chest 1 View  Result Date: 03/09/2017 CLINICAL DATA:  Weakness and recent pneumonia EXAM: CHEST 1 VIEW COMPARISON:  Multiple exams, including 02/15/2017 FINDINGS: Significantly worsened right pleural effusion, questionably  loculated. Lobular low-density over the margins of the effusion could be from overlying aerated lung or less likely gas within the pleural cavity. Hazy density in the right hemithorax likely primarily from the pleural effusion, superimposed pneumonia is not excluded. Mildly improved aeration at the left lung base. The indistinctness of the pulmonary vasculature is improved, at least on the left. Atherosclerotic calcification of the aortic arch. IMPRESSION: 1. Significant worsening in hazy opacity and suspected pleural effusion on the right. Superimposed pneumonia not excluded. Gas density along the pleural effusion probably represents aerated lung projecting over potentially loculated pleural effusion, a small amount of gas in the pleural space is a less likely differential diagnostic consideration. Empyema is not excluded. 2. Mildly improved aeration at the left lung base. 3.  Atherosclerotic calcification of the aortic arch. Electronically Signed   By: Van Clines M.D.   On: 02/19/2017 11:43   Ct Chest Wo Contrast  Result Date: 03/07/2017 CLINICAL DATA:  Worsening shortness of breath, abnormal chest radiograph with question pneumonia, leukocytosis, sepsis, abnormal LFTs, history C difficile colitis, pulmonary hypertension, persistent atrial fibrillation, diabetes mellitus, prostate cancer, low-grade myelodysplastic syndrome, former smoker EXAM: CT CHEST, ABDOMEN AND PELVIS WITHOUT CONTRAST TECHNIQUE: Multidetector CT imaging of the chest, abdomen and pelvis was performed following the standard protocol without IV contrast.  Sagittal and coronal MPR images reconstructed from axial data set. No oral contrast was administered. COMPARISON:  CT chest 02/15/2017, CT abdomen and pelvis 03/10/2016, lumbar spine radiographs 02/15/2017 FINDINGS: CT CHEST FINDINGS Cardiovascular: Atherosclerotic calcifications aorta, proximal great vessels and coronary arteries. Aorta normal caliber. Significant aortic valvular calcification. No pericardial effusion. Enlarged central pulmonary arteries consistent with history of pulmonary arterial hypertension. Mediastinum/Nodes: Calcified lymph nodes RIGHT hilum and mediastinum consistent with old granulomatous disease. Esophagus unremarkable. No enlarged thoracic lymph nodes. Base of cervical region unremarkable. Lungs/Pleura: Atelectasis and peribronchial thickening in LEFT lower lobe. No LEFT pleural effusion. Moderate RIGHT pleural effusion which appears at least partially loculated. Foci of gas are seen within the RIGHT hemithorax as well consistent with hydropneumothorax. Calcified granulomata RIGHT lung. Compressive atelectasis of the RIGHT lung particularly RIGHT lower lobe to lesser degree posterior aspects of the RIGHT upper and RIGHT middle lobes. No additional pulmonary mass. No definite endobronchial lesions. Musculoskeletal: Osseous demineralization. Old RIGHT rib fractures. Chronic appearing height losses of several thoracic vertebra. CT ABDOMEN PELVIS FINDINGS Hepatobiliary: Gallbladder contracted.  Liver unremarkable. Pancreas: Normal appearance Spleen: Atrophic Adrenals/Urinary Tract: Adrenal glands unremarkable. Absent LEFT kidney. Probable small RIGHT renal cysts without gross hydronephrosis or solid mass the ureters and bladder unremarkable. Stomach/Bowel: Normal appendix. Scattered colonic diverticulosis without evidence of diverticulitis. Rectosigmoid wall thickening with stranding of perirectal fat into presacral space. Stomach and bowel loops otherwise normal appearance.  Vascular/Lymphatic: Atherosclerotic calcifications without aneurysm. Tortuous iliac arteries with atherosclerotic calcification extending into common femoral arteries. No adenopathy. Reproductive: Minimal prostatic enlargement Other: No free air or free fluid.  No hernia. Musculoskeletal: Osseous demineralization. Superior endplate compression fracture L1 as noted on recent lumbar spine radiographs. IMPRESSION: Moderate RIGHT pleural effusion, suspect loculated, with additional component of loculated hydropneumothorax laterally, cannot exclude empyema in patient with history of pneumonia and sepsis. Compressive atelectasis of RIGHT lung with central chronic bronchitic changes, old granulomatous disease, and mild LEFT basilar atelectasis. Enlargement of cardiac chambers with central pulmonary arterial enlargement consistent with pulmonary hypertension. Scattered colonic diverticulosis. Wall thickening of rectosigmoid colon with perirectal edema compatible with colitis, could represent infection or inflammatory bowel disease. Small RIGHT renal cysts and evidence of  prior LEFT nephrectomy. Aortic Atherosclerosis (ICD10-I70.0). Findings called to Dr. Melina Copa On 02/25/2017 at 1545 hrs. Electronically Signed   By: Lavonia Dana M.D.   On: 02/26/2017 15:45   Dg Chest Port 1 View  Result Date: April 13, 2017 CLINICAL DATA:  Respiratory failure, chest tube EXAM: PORTABLE CHEST 1 VIEW COMPARISON:  03/19/2017 FINDINGS: Right chest tube remains in place with small right lateral pneumothorax, slightly increased in size since prior study. Cardiomegaly. Diffuse right lung and left lower lobe airspace opacities have increased since prior study. Suspect small bilateral effusions. IMPRESSION: Right lateral chest tube remains in place with small right lateral pneumothorax, slightly increased since prior study. Increasing right lung and left lower lobe airspace disease which could reflect edema, atelectasis or infection. Small bilateral  effusions. Electronically Signed   By: Rolm Baptise M.D.   On: 04/13/17 08:34   Dg Chest Port 1 View  Result Date: 03/19/2017 CLINICAL DATA:  Empyema.  Right chest tube. EXAM: PORTABLE CHEST 1 VIEW COMPARISON:  Chest x-ray dated 03/13/2017 and CT scan dated 03/07/2017 FINDINGS: Pigtail drainage catheter is present at the lateral aspect of the right lung base. Small right Pyopneumothorax. Marked decrease in the pleural fluid on the right since the prior CT scan. New atelectasis at the left lung base posterior medially. Heart size and vascularity are normal. Aortic atherosclerosis. IMPRESSION: 1. Decreased right empyema since the prior CT scan. Drainage tube appears in good position. Small right pyopneumothorax. 2. New atelectasis at the left lung base posterior medially. Electronically Signed   By: Lorriane Shire M.D.   On: 03/19/2017 09:40   Ct Image Guided Fluid Drain By Catheter  Result Date: 03/18/2017 INDICATION: History of right-sided empyema. Please perform CT-guided chest tube placement for infection source control purposes. EXAM: CT IMAGE GUIDED FLUID DRAIN BY CATHETER COMPARISON:  Chest CT - 03/01/2017 MEDICATIONS: The patient is currently admitted to the hospital and receiving intravenous antibiotics. The antibiotics were administered within an appropriate time frame prior to the initiation of the procedure. Narcan 1 mg IV ANESTHESIA/SEDATION: Moderate (conscious) sedation was employed during this procedure. A total of Versed 1 mg and Fentanyl 50 mcg was administered intravenously. Moderate Sedation Time: 20 minutes. The patient's level of consciousness and vital signs were monitored continuously by radiology nursing throughout the procedure under my direct supervision. CONTRAST:  None COMPLICATIONS: SIR LEVEL B - Normal therapy, includes overnight admission for observation. Procedure complicated by over sedation requiring administration of Narcan 1 mg IV with prompt improved responsiveness and  oxygen saturation. PROCEDURE: Informed written consent was obtained from the patient's family after a discussion of the risks, benefits and alternatives to treatment. The patient was placed supine, slightly LPO on the CT gantry and a pre procedural CT was performed re-demonstrating the known loculated right-sided hydropneumothorax. The procedure was planned. A timeout was performed prior to the initiation of the procedure. The skin overlying the anterior inferior aspect the right chest was prepped and draped in the usual sterile fashion. The overlying soft tissues were anesthetized with 1% lidocaine with epinephrine. Appropriate trajectory was planned with the use of a 22 gauge spinal needle. An 18 gauge trocar needle was advanced into the right pleural space and a short Amplatz super stiff wire was coiled within the right pleural space. Appropriate positioning was confirmed with a limited CT scan. The tract was serially dilated allowing placement of a 12 [place] all-purpose drainage catheter. Appropriate positioning was confirmed with a limited postprocedural CT scan. Approximately 1 L of purulent, foul  smelling fluid was aspirated. The tube was connected to a pleural vac device and sutured in place. A dressing was placed. The patient tolerated the procedure well without immediate post procedural complication. IMPRESSION: 1. Successful CT guided placement of a 12 [place] all purpose drain catheter into the right pleural space with aspiration of approximately 1 L of purulent, foul smelling fluid. Samples were sent to the laboratory as requested by the ordering clinical team. 2. Procedure complicated by over sedation requiring administration of Narcan 1 mg IV with prompt improved responsiveness and oxygen saturation. Electronically Signed   By: Sandi Mariscal M.D.   On: 03/18/2017 17:30    Microbiology: Recent Results (from the past 240 hour(s))  Culture, blood (routine x 2)     Status: None   Collection Time:  02/23/2017 11:05 AM  Result Value Ref Range Status   Specimen Description   Final    BLOOD RIGHT HAND Performed at [facility] [address], [place], [place] [address]    Special Requests   Final    BOTTLES DRAWN AEROBIC AND ANAEROBIC Blood Culture adequate volume Performed at [facility] [address] [place], [place] [address]    Culture   Final    NO GROWTH 5 DAYS Performed at [facility], [place] [address] [place], [place] [address]    Report Status 03/20/2017 FINAL  Final  Culture, blood (routine x 2)     Status: None   Collection Time: 02/16/2017 11:06 AM  Result Value Ref Range Status   Specimen Description   Final    BLOOD RIGHT ANTECUBITAL Performed at [facility] [address], [place], [place] [address]    Special Requests   Final    IN PEDIATRIC BOTTLE Blood Culture adequate volume Performed at [facility] [address] [place], [place] [address]    Culture   Final    NO GROWTH 5 DAYS Performed at [facility], [place] [address], [place], [place] [address]    Report Status 03/20/2017 FINAL  Final  MRSA PCR Screening     Status: None   Collection Time: 03/16/17 10:00 AM  Result Value Ref Range Status   MRSA by PCR NEGATIVE NEGATIVE Final    Comment:        The GeneXpert MRSA Assay (FDA approved for NASAL specimens only), is one component of a comprehensive MRSA colonization surveillance program. It is not intended to diagnose MRSA infection nor to guide or monitor treatment for MRSA infections. Performed at [facility], [place] [address], [place], [place] [address]   C difficile quick scan w PCR reflex     Status: Abnormal   Collection Time: 03/18/17 10:57 AM  Result Value Ref Range Status   C Diff antigen POSITIVE (A) NEGATIVE Final   C Diff toxin NEGATIVE NEGATIVE Final   C Diff interpretation Results are indeterminate. See PCR results.   Final    Comment: Performed at [facility], [place] [address], [place], [place] [address]  Gastrointestinal Panel by PCR , Stool     Status: Abnormal   Collection Time: 03/18/17 10:57 AM  Result Value Ref Range Status   Campylobacter species NOT DETECTED NOT DETECTED Final   Plesimonas shigelloides NOT DETECTED NOT DETECTED Final   Salmonella species NOT DETECTED NOT DETECTED Final   Yersinia enterocolitica NOT DETECTED NOT DETECTED Final   Vibrio species NOT DETECTED NOT DETECTED Final  Vibrio cholerae NOT DETECTED NOT DETECTED Final   Enteroaggregative E coli (EAEC) NOT DETECTED NOT DETECTED Final   Enteropathogenic E coli (EPEC) NOT DETECTED NOT DETECTED Final   Enterotoxigenic E coli (ETEC) NOT DETECTED NOT DETECTED Final   Shiga like toxin producing E coli (STEC) NOT DETECTED NOT DETECTED Final   Shigella/Enteroinvasive E coli (EIEC) NOT DETECTED NOT DETECTED Final   Cryptosporidium NOT DETECTED NOT DETECTED Final   Cyclospora cayetanensis NOT DETECTED NOT DETECTED Final   Entamoeba histolytica NOT DETECTED NOT DETECTED Final   Giardia lamblia NOT DETECTED NOT DETECTED Final   Adenovirus F40/41 NOT DETECTED NOT DETECTED Final   Astrovirus NOT DETECTED NOT DETECTED Final   Norovirus GI/GII DETECTED (A) NOT DETECTED Final    Comment: RESULT CALLED TO, READ BACK BY AND VERIFIED WITH: CINDY MILLAR AT 1610 ON 03/19/17 BY SNJ    Rotavirus A NOT DETECTED NOT DETECTED Final   Sapovirus (I, II, IV, and V) NOT DETECTED NOT DETECTED Final    Comment: Performed at [facility], [place] [place], [place] [address]  C. Diff by PCR, Reflexed     Status: Abnormal   Collection Time: 03/18/17 10:57 AM  Result Value Ref Range Status   Toxigenic C. Difficile by PCR POSITIVE (A) NEGATIVE Final    Comment: Positive for toxigenic C. difficile with little to no toxin production. Only treat if clinical presentation suggests symptomatic illness. Performed at [facility], [place] [address] [place], [place] [address]   Body fluid culture     Status: None (Preliminary result)   Collection Time: 03/19/17 11:48 AM  Result Value Ref Range Status   Specimen Description   Final    PLEURAL RIGHT Performed at [facility] [address] [place], [place] [address]    Special Requests   Final    NONE Performed at [facility], [place] [address] [place], [place] [address]    Gram Stain   Final    ABUNDANT WBC PRESENT, PREDOMINANTLY PMN ABUNDANT GRAM NEGATIVE RODS    Culture   Final    CULTURE REINCUBATED FOR BETTER GROWTH Performed at [facility], [place] [address] [place], [place] [address]    Report Status PENDING  Incomplete  Culture, Urine     Status: Abnormal   Collection Time: 03/19/17  6:00 PM  Result Value Ref Range Status   Specimen Description   Final    URINE, RANDOM Performed at [facility] [address], [place], [place] [address]    Special Requests   Final    NONE Performed at [facility], [place] [address], [place],  [address]    Culture 80,000 COLONIES/mL YEAST (A)  Final   Report Status 2017-04-07 FINAL  Final     Labs: Basic Metabolic Panel: Recent Labs  Lab 03/17/17 0342 03/18/17 0353 03/19/17 0405 03/19/17 1713 03/20/17 0419 07-Apr-2017 0245  NA 143 140 142 142 141 144  K 4.5 4.5 5.2* 5.1 4.6 4.1  CL 116* 114* 117* 118* 116* 117*  CO2 19* 18* 16* 17* 17* 19*  GLUCOSE 150* 113* 127* 149* 139* 103*  BUN 65* 64* 67* 77* 68* 65*  CREATININE 2.60* 2.51* 2.63* 2.72* 2.66* 2.70*  CALCIUM 7.8* 7.9* 8.1* 7.9* 7.8* 7.3*  MG 2.2 2.2 2.2  --  2.2 2.0   Liver Function Tests: Recent Labs  Lab 03/17/17 0342 03/18/17 0353 03/19/17 0405 03/19/17 1302 03/20/17 0419 April 07, 2017  0245  AST 201* 110* 61*  --  38 28  ALT 428* 312* 227*  --  156* 119*  ALKPHOS 94 99 93  --  84 78  BILITOT 0.9 0.8 0.9  --  0.7 0.8  PROT 5.8* 5.8* 5.7* 5.9*  5.5* 5.4*  ALBUMIN 1.9* 1.8* 1.9*  --  1.8* 1.7*   Recent Labs  Lab 03/05/2017 1106  LIPASE 48   CBC: Recent Labs  Lab 02/27/2017 1106 03/16/17 0347  03/18/17 0353 03/19/17 0405 03/20/17 0419 04/19/2017 0245 2017/04/19 1151  WBC 35.7* 36.6*   < > 24.9* 20.1* 12.9* 8.7 7.4  NEUTROABS 33.2* 33.7  --   --   --   --   --   --   HGB 9.6* 9.2*   < > 8.4* 9.3* 8.0* 7.9* 5.6*  HCT 29.5* 28.5*   < > 26.4* 29.6* 24.9* 24.6* 17.3*  MCV 79.5 79.2   < > 80.5 81.1 79.6 79.1 79.7  PLT 349 248   < > 183 145* 152 123* 92*   < > = values in this interval not displayed.   Urinalysis    Component Value Date/Time   COLORURINE AMBER (A) 03/19/2017 1800   APPEARANCEUR CLOUDY (A) 03/19/2017 1800   LABSPEC 1.016 03/19/2017 1800   PHURINE 5.0 03/19/2017 1800   GLUCOSEU NEGATIVE 03/19/2017 1800   HGBUR SMALL (A) 03/19/2017 1800   BILIRUBINUR NEGATIVE 03/19/2017 1800   KETONESUR NEGATIVE 03/19/2017 1800   PROTEINUR 30 (A) 03/19/2017 1800   UROBILINOGEN 0.2 07/14/2012 1127   NITRITE NEGATIVE 03/19/2017 1800   LEUKOCYTESUR LARGE (A) 03/19/2017 1800    [facility] 03/22/2017, 10:14 AM

## 2017-04-16 NOTE — Progress Notes (Signed)
Patient passed at 2051.  Absence of heart sounds was verified by Jennye Moccasin RN. Next of Kin Verita Schneiders (son) was notified. Daughter Rubin Payor) came to the hospital.  Provider, and Bed control notified. Patient is now being taken to the morgue.

## 2017-04-16 NOTE — Progress Notes (Signed)
Pt has been without fluids and antibiotics from 2100-0300 as 2 of his IVs infiltrated. After multiple attemps by the IV team, a midline catheter was placed. Antibiotics are now currently running.

## 2017-04-16 DEATH — deceased

## 2017-04-18 ENCOUNTER — Ambulatory Visit: Payer: Medicare Other

## 2017-04-18 ENCOUNTER — Other Ambulatory Visit: Payer: Medicare Other

## 2017-04-23 ENCOUNTER — Other Ambulatory Visit: Payer: Self-pay | Admitting: Cardiology

## 2017-04-25 ENCOUNTER — Ambulatory Visit: Payer: Medicare Other

## 2017-04-25 ENCOUNTER — Other Ambulatory Visit: Payer: Medicare Other

## 2017-05-02 ENCOUNTER — Ambulatory Visit: Payer: Medicare Other

## 2017-05-09 ENCOUNTER — Ambulatory Visit: Payer: Medicare Other

## 2017-05-09 ENCOUNTER — Other Ambulatory Visit: Payer: Medicare Other

## 2017-05-16 ENCOUNTER — Other Ambulatory Visit: Payer: Medicare Other

## 2017-05-16 ENCOUNTER — Ambulatory Visit: Payer: Medicare Other

## 2017-05-23 ENCOUNTER — Ambulatory Visit: Payer: Medicare Other

## 2017-05-23 ENCOUNTER — Other Ambulatory Visit: Payer: Medicare Other

## 2017-05-30 ENCOUNTER — Ambulatory Visit: Payer: Medicare Other

## 2017-06-06 ENCOUNTER — Ambulatory Visit: Payer: Medicare Other

## 2019-03-05 IMAGING — CT CT ABD-PELV W/O CM
2 of 4 series · 14 of 46 positions shown, 16 images · non-contrast
Comparison: CT chest 02/15/2017, CT abdomen and pelvis 03/10/2016,
lumbar spine radiographs 02/15/2017

CLINICAL DATA: Worsening shortness of breath, abnormal chest
radiograph with question pneumonia, leukocytosis, sepsis, abnormal
LFTs, history C difficile colitis, pulmonary hypertension,
persistent atrial fibrillation, diabetes mellitus, prostate cancer,
low-grade myelodysplastic syndrome, former smoker

EXAM:
CT CHEST, ABDOMEN AND PELVIS WITHOUT CONTRAST
TECHNIQUE: Multidetector CT imaging of the chest, abdomen and pelvis was
performed following the standard protocol without IV contrast.
Sagittal and coronal MPR images reconstructed from axial data set.
No oral contrast was administered.

[Series 2: cap w/o · axial · non-contrast · 0.76mm/px · z∈[+1084,+1604]mm · 11 of 122 slices shown, 13 images]
[im 9/122  soft-tissue]
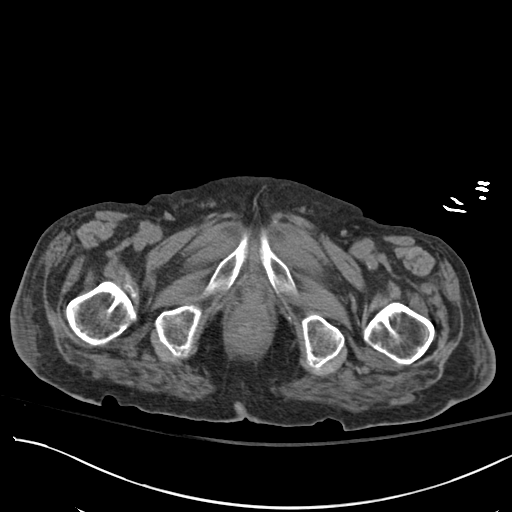
[im 9/122  bone]
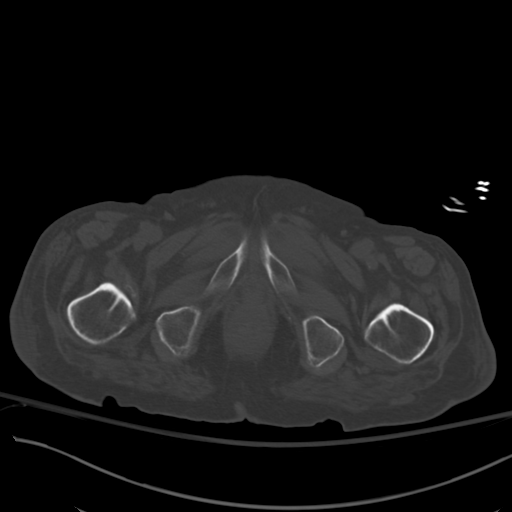
[im 18/122  soft-tissue]
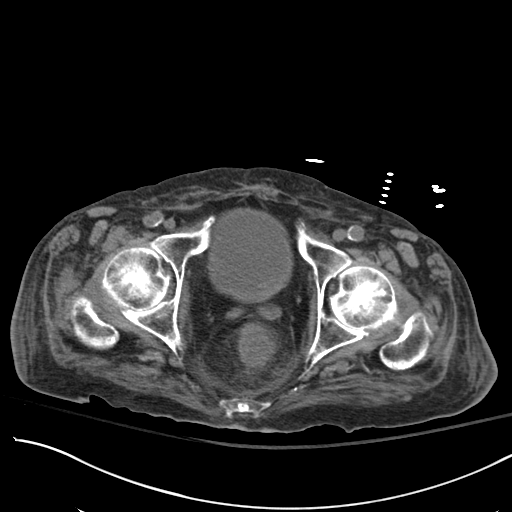
[im 26/122  soft-tissue]
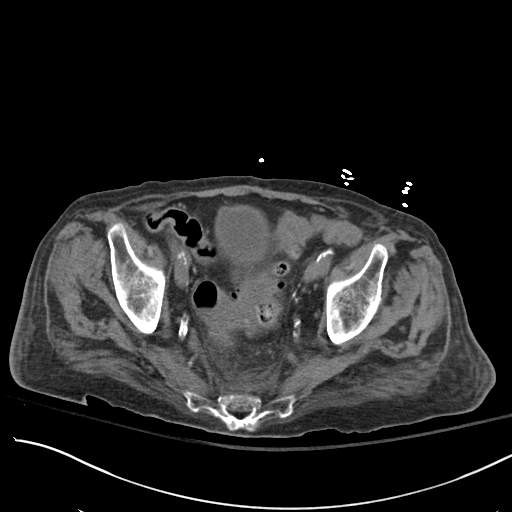
[im 44/122  soft-tissue]
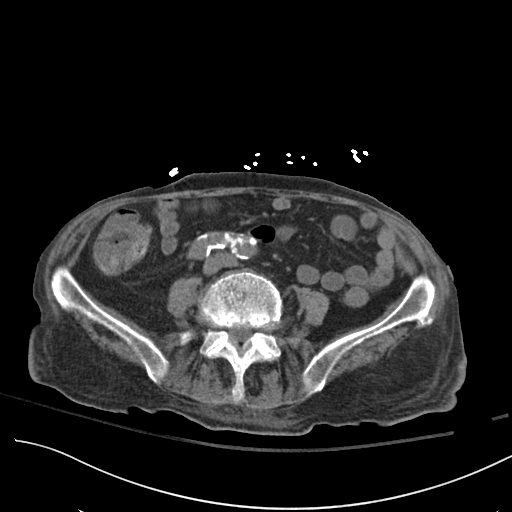
[im 52/122  soft-tissue]
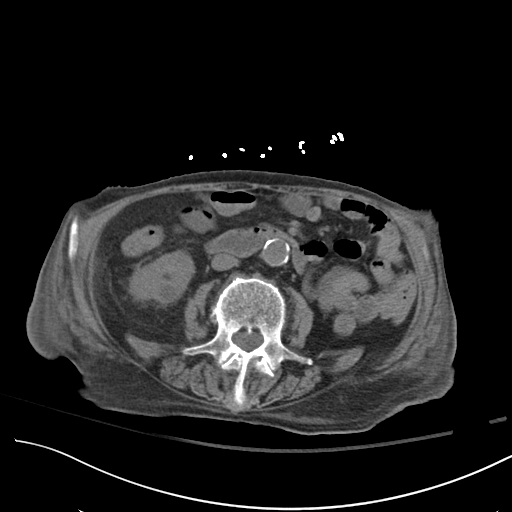
[im 61/122  soft-tissue]
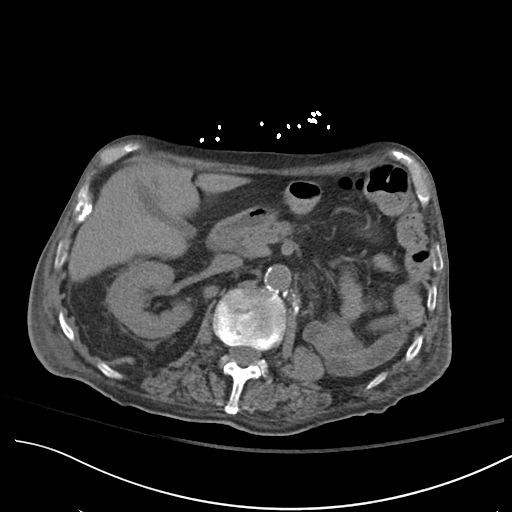
[im 70/122  soft-tissue]
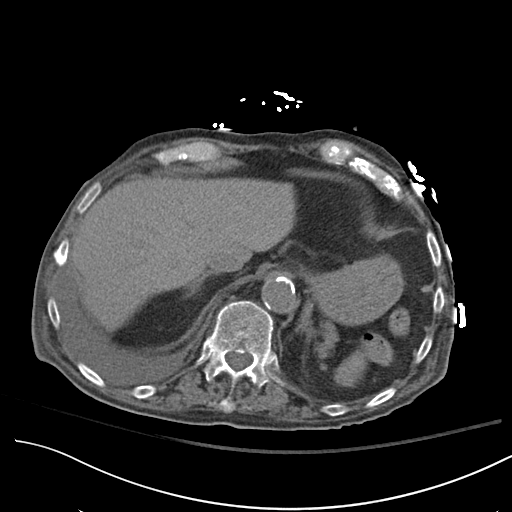
[im 78/122  soft-tissue]
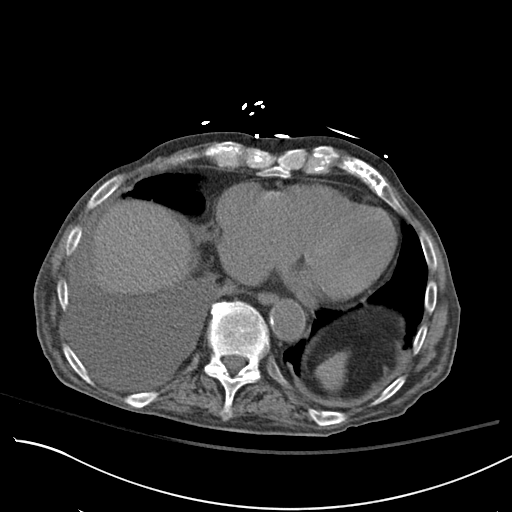
[im 96/122  soft-tissue]
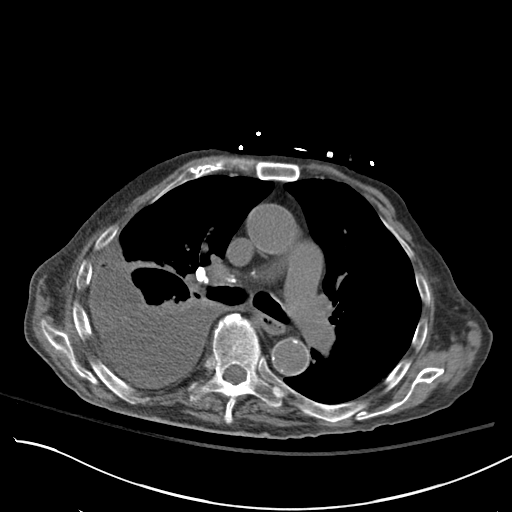
[im 96/122  bone]
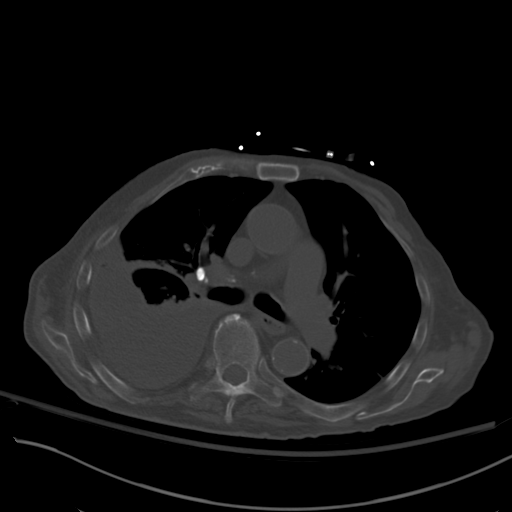
[im 104/122  soft-tissue]
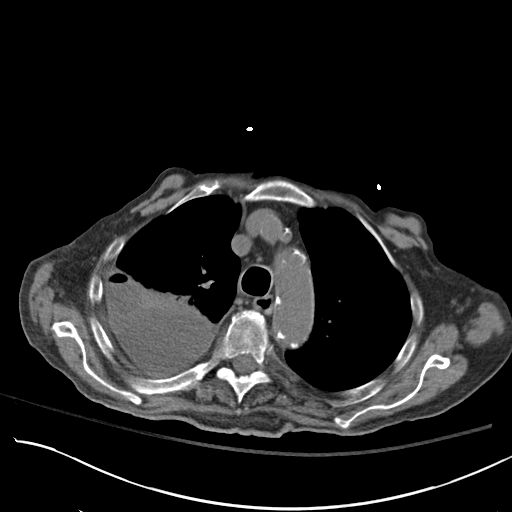
[im 113/122  soft-tissue]
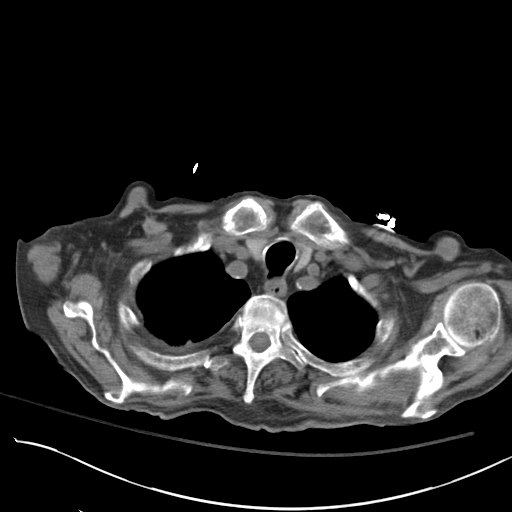

[Series 4: coronals · coronal · 0.90mm/px · 3 of 111 slices shown]
[im 37/111  soft-tissue]
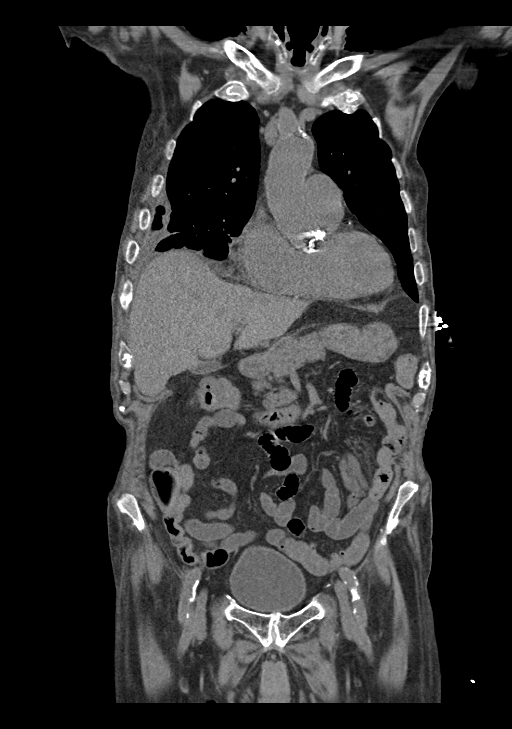
[im 49/111  soft-tissue]
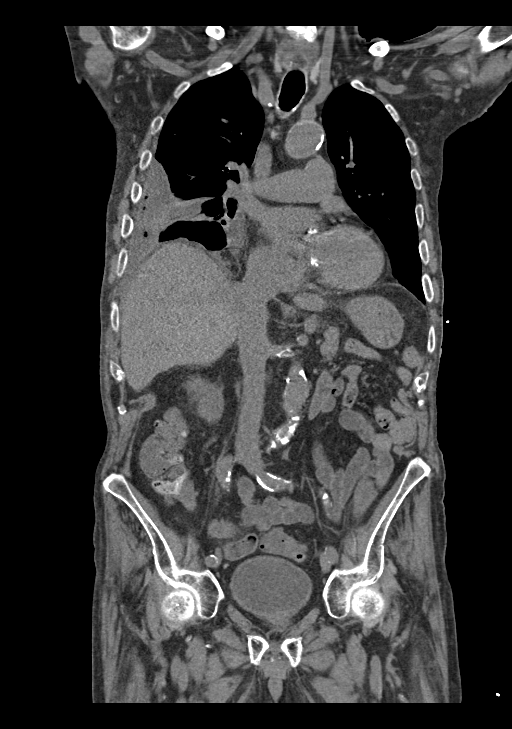
[im 62/111  soft-tissue]
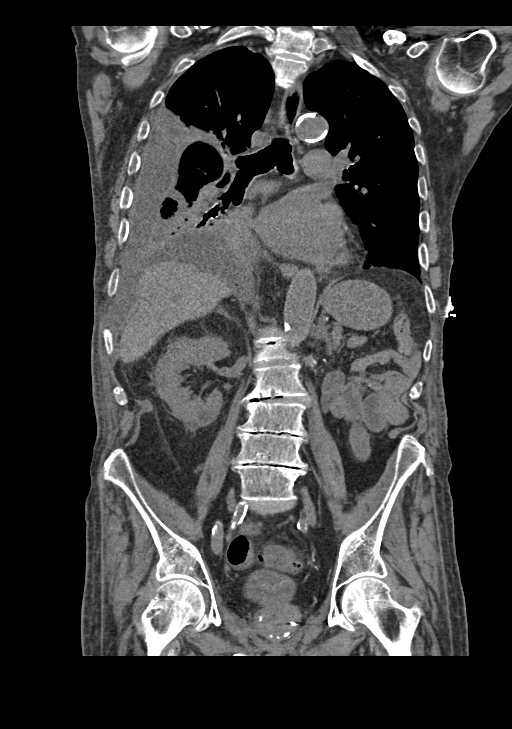

[14 of 46 positions shown; findings below may reference images not displayed]

FINDINGS: CT CHEST FINDINGS

Cardiovascular: Atherosclerotic calcifications aorta, proximal great
vessels and coronary arteries. Aorta normal caliber. Significant
aortic valvular calcification. No pericardial effusion. Enlarged
central pulmonary arteries consistent with history of pulmonary
arterial hypertension.

Mediastinum/Nodes: Calcified lymph nodes RIGHT hilum and mediastinum
consistent with old granulomatous disease. Esophagus unremarkable.
No enlarged thoracic lymph nodes. Base of cervical region
unremarkable.

Lungs/Pleura: Atelectasis and peribronchial thickening in LEFT lower
lobe. No LEFT pleural effusion. Moderate RIGHT pleural effusion
which appears at least partially loculated. Foci of gas are seen
within the RIGHT hemithorax as well consistent with
hydropneumothorax. Calcified granulomata RIGHT lung. Compressive
atelectasis of the RIGHT lung particularly RIGHT lower lobe to
lesser degree posterior aspects of the RIGHT upper and RIGHT middle
lobes. No additional pulmonary mass. No definite endobronchial
lesions.

Musculoskeletal: Osseous demineralization. Old RIGHT rib fractures.
Chronic appearing height losses of several thoracic vertebra.

CT ABDOMEN PELVIS FINDINGS

Hepatobiliary: Gallbladder contracted.  Liver unremarkable.

Pancreas: Normal appearance

Spleen: Atrophic

Adrenals/Urinary Tract: Adrenal glands unremarkable. Absent LEFT
kidney. Probable small RIGHT renal cysts without gross
hydronephrosis or solid mass the ureters and bladder unremarkable.

Stomach/Bowel: Normal appendix. Scattered colonic diverticulosis
without evidence of diverticulitis. Rectosigmoid wall thickening
with stranding of perirectal fat into presacral space. Stomach and
bowel loops otherwise normal appearance.

Vascular/Lymphatic: Atherosclerotic calcifications without aneurysm.
Tortuous iliac arteries with atherosclerotic calcification extending
into common femoral arteries. No adenopathy.

Reproductive: Minimal prostatic enlargement

Other: No free air or free fluid.  No hernia.

Musculoskeletal: Osseous demineralization. Superior endplate
compression fracture L1 as noted on recent lumbar spine radiographs.
IMPRESSION: Moderate RIGHT pleural effusion, suspect loculated, with additional
component of loculated hydropneumothorax laterally, cannot exclude
empyema in patient with history of pneumonia and sepsis.

Compressive atelectasis of RIGHT lung with central chronic
bronchitic changes, old granulomatous disease, and mild LEFT basilar
atelectasis.

Enlargement of cardiac chambers with central pulmonary arterial
enlargement consistent with pulmonary hypertension.

Scattered colonic diverticulosis.

Wall thickening of rectosigmoid colon with perirectal edema
compatible with colitis, could represent infection or inflammatory
bowel disease.

Small RIGHT renal cysts and evidence of prior LEFT nephrectomy.

Aortic Atherosclerosis ([id number]).

Findings called to Dr. Sichaca On 03/15/2017 at 0272 hrs.
